# Patient Record
Sex: Female | Born: 1970 | Race: White | Hispanic: No | Smoking: Current some day smoker
Health system: Southern US, Community
[De-identification: ages and names within clinical notes are randomized; demographics above are authoritative.]

## PROBLEM LIST (undated history)

## (undated) DIAGNOSIS — G8929 Other chronic pain: Secondary | ICD-10-CM

## (undated) DIAGNOSIS — F32A Depression, unspecified: Secondary | ICD-10-CM

## (undated) DIAGNOSIS — E049 Nontoxic goiter, unspecified: Secondary | ICD-10-CM

## (undated) DIAGNOSIS — R413 Other amnesia: Secondary | ICD-10-CM

## (undated) DIAGNOSIS — F988 Other specified behavioral and emotional disorders with onset usually occurring in childhood and adolescence: Secondary | ICD-10-CM

## (undated) DIAGNOSIS — F329 Major depressive disorder, single episode, unspecified: Secondary | ICD-10-CM

## (undated) DIAGNOSIS — E039 Hypothyroidism, unspecified: Secondary | ICD-10-CM

## (undated) DIAGNOSIS — M1990 Unspecified osteoarthritis, unspecified site: Secondary | ICD-10-CM

## (undated) DIAGNOSIS — I10 Essential (primary) hypertension: Secondary | ICD-10-CM

## (undated) DIAGNOSIS — F419 Anxiety disorder, unspecified: Secondary | ICD-10-CM

## (undated) DIAGNOSIS — M797 Fibromyalgia: Secondary | ICD-10-CM

## (undated) HISTORY — PX: APPENDECTOMY: SHX54

## (undated) HISTORY — PX: ABDOMINAL HYSTERECTOMY: SHX81

## (undated) HISTORY — DX: Nontoxic goiter, unspecified: E04.9

## (undated) HISTORY — DX: Unspecified osteoarthritis, unspecified site: M19.90

## (undated) HISTORY — PX: ABDOMINAL SURGERY: SHX537

## (undated) HISTORY — DX: Major depressive disorder, single episode, unspecified: F32.9

## (undated) HISTORY — DX: Depression, unspecified: F32.A

## (undated) HISTORY — DX: Anxiety disorder, unspecified: F41.9

---

## 1996-04-10 HISTORY — PX: TONSILLECTOMY: SUR1361

## 1997-09-30 ENCOUNTER — Other Ambulatory Visit: Admission: RE | Admit: 1997-09-30 | Discharge: 1997-09-30 | Payer: Self-pay | Admitting: *Deleted

## 1997-10-07 ENCOUNTER — Other Ambulatory Visit: Admission: RE | Admit: 1997-10-07 | Discharge: 1997-10-07 | Payer: Self-pay | Admitting: Obstetrics and Gynecology

## 1997-10-22 ENCOUNTER — Other Ambulatory Visit: Admission: RE | Admit: 1997-10-22 | Discharge: 1997-10-22 | Payer: Self-pay | Admitting: Obstetrics and Gynecology

## 1997-12-11 ENCOUNTER — Observation Stay (HOSPITAL_COMMUNITY): Admission: RE | Admit: 1997-12-11 | Discharge: 1997-12-11 | Payer: Self-pay | Admitting: Obstetrics and Gynecology

## 1998-07-14 ENCOUNTER — Emergency Department (HOSPITAL_COMMUNITY): Admission: EM | Admit: 1998-07-14 | Discharge: 1998-07-14 | Payer: Self-pay | Admitting: Emergency Medicine

## 1999-11-08 ENCOUNTER — Emergency Department (HOSPITAL_COMMUNITY): Admission: EM | Admit: 1999-11-08 | Discharge: 1999-11-08 | Payer: Self-pay | Admitting: Emergency Medicine

## 2000-09-13 ENCOUNTER — Emergency Department (HOSPITAL_COMMUNITY): Admission: EM | Admit: 2000-09-13 | Discharge: 2000-09-13 | Payer: Self-pay | Admitting: Emergency Medicine

## 2001-01-16 ENCOUNTER — Ambulatory Visit (HOSPITAL_COMMUNITY): Admission: RE | Admit: 2001-01-16 | Discharge: 2001-01-16 | Payer: Self-pay | Admitting: Pulmonary Disease

## 2003-11-29 ENCOUNTER — Emergency Department (HOSPITAL_COMMUNITY): Admission: EM | Admit: 2003-11-29 | Discharge: 2003-11-29 | Payer: Self-pay | Admitting: Emergency Medicine

## 2005-08-08 ENCOUNTER — Ambulatory Visit (HOSPITAL_COMMUNITY): Admission: RE | Admit: 2005-08-08 | Discharge: 2005-08-08 | Payer: Self-pay | Admitting: Family Medicine

## 2005-09-19 ENCOUNTER — Ambulatory Visit (HOSPITAL_COMMUNITY): Admission: RE | Admit: 2005-09-19 | Discharge: 2005-09-19 | Payer: Self-pay | Admitting: Family Medicine

## 2005-10-12 ENCOUNTER — Encounter (HOSPITAL_COMMUNITY): Admission: RE | Admit: 2005-10-12 | Discharge: 2005-11-11 | Payer: Self-pay | Admitting: Unknown Physician Specialty

## 2006-09-21 ENCOUNTER — Emergency Department (HOSPITAL_COMMUNITY): Admission: EM | Admit: 2006-09-21 | Discharge: 2006-09-21 | Payer: Self-pay | Admitting: Emergency Medicine

## 2007-02-06 ENCOUNTER — Other Ambulatory Visit: Admission: RE | Admit: 2007-02-06 | Discharge: 2007-02-06 | Payer: Self-pay | Admitting: Obstetrics & Gynecology

## 2007-03-12 ENCOUNTER — Inpatient Hospital Stay (HOSPITAL_COMMUNITY): Admission: RE | Admit: 2007-03-12 | Discharge: 2007-03-14 | Payer: Self-pay | Admitting: Obstetrics & Gynecology

## 2007-03-12 ENCOUNTER — Encounter: Payer: Self-pay | Admitting: Obstetrics & Gynecology

## 2007-04-11 HISTORY — PX: PARTIAL HYSTERECTOMY: SHX80

## 2008-09-23 ENCOUNTER — Ambulatory Visit (HOSPITAL_COMMUNITY): Admission: RE | Admit: 2008-09-23 | Discharge: 2008-09-23 | Payer: Self-pay | Admitting: Family Medicine

## 2010-08-23 NOTE — Op Note (Signed)
NAMEKETINA, MARS             ACCOUNT NO.:  [id number]   MEDICAL RECORD NO.:  [id number]          PATIENT TYPE:  INP   LOCATION:  9309                          FACILITY:  [facility]   PHYSICIAN:  M. Leda Quail, MD  DATE OF BIRTH:  1971/02/09   DATE OF PROCEDURE:  03/12/2007  DATE OF DISCHARGE:                               OPERATIVE REPORT   PREOPERATIVE DIAGNOSES:  33. A 40 year old, gravida 2 para 2, married white female with chronic      pelvic pain.  2. Fibroids.  3. History of endometriosis.  4. Second history.  5. History of cesarean section x2.   POSTOPERATIVE DIAGNOSES:  48. A 40 year old, gravida 2 para 2, divorced white female with chronic      pelvic pain.  2. Fibroids.  3. History of endometriosis.  4. Second history.  5. History of cesarean section x2.   PROCEDURE:  TAH.   SURGEON:  M. Leda Quail, M.D.   ASSISTANT:  Edwena Felty. Romine, M.D.   ANESTHESIA:  General endotracheal.   FINDINGS:  At least two fundal fibroids were noted.  Survey of the  pelvis revealed a small amount of adhesions to the left ovary.  The site  of prior appendectomy was noted.  There was essentially no endometriosis  noted.  The anterior/posterior cul-de-sacs were surveyed clearly as well  as were the ovaries.  There was some extensive bladder adhesions  anteriorly on the bladder from the bladder to the cervix from her  previous two C-sections.   SPECIMENS:  Uterus and cervix sent to pathology.   ESTIMATED BLOOD LOSS:  250 mL.   FLUIDS:  3700 mL of LR.   URINE OUTPUT:  40 mL of clear urine in the Foley catheter.   COMPLICATIONS:  None.   INDICATIONS:  Ms. Ronique Simerly is a 40 year old, G2 P2, divorced  white female who has a history of chronic pelvic pain.  She underwent  laparoscopy in 1999 and endometriosis was diagnosed.  It was treated and  her pain did improve.  She has had two children delivered with C-  sections.  Since her laparoscopy, slowly the pain has  recurred.  This is  very similar to previous pain in the past.  Because of her diffuse  pelvic pain, physical exam was actually quite difficult when I initially  saw her.  Pelvic ultrasound showed some thin filmy adhesions in the  posterior cul-de-sac with some fluid present.  These were possibly due  to endometriosis.  Otherwise, she had no significant findings except for  some fibroids as noted on the above findings.  The patient and I  discussed a possible cause of the pelvic pain and treatment options.  She has had her tubes tied and she is completely done with childbearing.  Because she has history of endometriosis and the pain is very similar  but more intense, she requested definitive therapy.  We decided to  proceed with abdominal hysterectomy because of her previous two C-  sections.  Informed consent was obtained in the clinic and this was  present on the patient's clinic chart.  DESCRIPTION OF PROCEDURE:  The patient was taken to the operating room.  She was placed in the supine position.  General endotracheal anesthesia  was administered the anesthesia staff without difficulty.  Legs were  positioned in the frog-leg position.  Exam under anesthesia was  performed.  A fairly mobile uterus was noted.  Fundal fibroids are much  more definitively noted on the exam under anesthesia.  The abdomen,  perineum, inner thighs, and the vagina were prepped with a Betadine  prep.  A Foley catheter was inserted in the bladder under sterile  conditions.  The legs were straightened and bent over a pillow.  The  abdomen was then draped in a normal sterile fashion.   With sterile gown and gloves, a Pfannenstiel skin incision was made with  a knife.  This was carried down through subcutaneous fat tissue.  The  fascia was identified and nicked in the midline.  The fascial incision  was extended laterally using curved Mayo scissors.  Kocher clamps were  applied.  The superior aspect of the  fascial incision and the underlying  rectus muscles were dissected off sharply.  In a similar fashion, Kocher  clamps were put in the inferior aspect of fascial incision and the  underlying rectus muscles were dissected off sharply.  Cautery was used  for hemostasis.  Then superiorly between the rectus muscles, the muscles  were divided using a hemostat.  The hemostat was then used to divide the  preperitoneal fat and the peritoneum was popped into very easily.  The  index finger was used to survey the peritoneal tissue below the  incision.  There was a small amount of omental adhesions but no bowel  was noted.  The incision was extended superiorly and inferiorly with  care taken to note first the location of the bowel and the bladder  inferiorly.   The upper abdomen was surveyed.  A sharp liver edge was noted.  A  decompressed gallbladder was noted.  The aorta felt of normal caliber.  There was no nodularity noted.  Because the patient is relatively thin,  an Teacher, early years/pre was used.  A larger retractor was placed in the  incision.  The retractor was then rolled down tightly to help enlarge  the incision.  The operator's hand was then used to sweep around the  retractor on the inside to ensure no omentum or bowel was caught in the  retractor, and this was done without any significant findings.  The  patient's knees were bent slightly, and she was placed in a small amount  of Trendelenburg.  Three moist laparotomy sponges were used to pack the  bowel superiorly.  The pelvis was surveyed at this point.  The ovaries  and fallopian tubes were clearly surveyed.  The above findings were  noted.  Long Kelly clamps were placed across the cornu of the uterus.  The attention was turned initially to the right round ligament.  The  right round ligament suture ligated with 0-Vicryl.  It was then incised  sharply.  The posterior leaf of the broad ligament opened and then the  inferior leaf of broad  ligament was opened down to the level of the  internal os.  There were some dense adhesions of the bladder located at  the level of the internal os.  These were taken down sharply.  The utero-  ovarian pedicle was then isolated.  A ureter was identified.  Curved  Heaney clamps were used to  clamp this pedicle twice.  The pedicle was  transected and then suture ligated with a free tie of 0-Vicryl and a  stitch tie of 0-Vicryl.  Attention was then turned to the left round  ligament.  This was suture ligated with 0-Vicryl.  This was incised  sharply.  The posterior leaf and the anterior leaf of the broad  ligaments were opened using Metzenbaum scissors.  There were dense  bladder adhesions as well located on the patient's left side.  These  were taken down sharply.  The glistening white tissue of the cervix was  then noted.  The dissection of the pubovesical cervical fascia was  performed at this point and a clear plane between the bladder and cervix  was easily noted.  Once all of the adhesions were taken down, then a  sponge stick was used to push the bladder down the cervix further.  Attention was then turned to the left utero-ovarian pedicle.  This was  isolated, transected, and suture ligated, first with a free tie of 0-  Vicryl and then a stitch tie of 0-Vicryl.   The uterine arteries were skeletonized bilaterally.  They were clamp  with curved Heaney clamps, transected, and suture ligated with two  stitches of 0-Vicryl.  Then the cardinal ligaments bilaterally were  serially clamped, transected, and suture ligated with 0-Vicryl.  Further  dissection of the bladder flap was performed as necessary.  At the base  of the cervix, the uterosacral ligaments were clamped, transected, and  Heaney stitched with 0-Vicryl.  Then curved Heaney clamps were placed  across the base of the cervix.  These pedicles were transected and the  vagina was entered.  A stitch of 0-Vicryl with Heaney stitches  were  placed at the corners of the of the vagina.  Then using Jorgenson  scissors, the cervix was circumferentially incised, leaving as much  vaginal tissue as possible.  The cervix and uterus were then handed off  to be sent to pathology.  The corners of the vagina were then stitched  and attached to the ipsilateral uterosacral ligament.  The vagina was  then closed with three additional stitches of 0-Vicryl.  There was a  small amount of bleeding noted at one of the stitches on the vaginal  cuff, and an additional figure-of-eight suture of 0-Vicryl was placed to  achieve hemostasis.  At this point, copious amounts of warm normal  saline were placed in the pelvis.  There was some bleeding on the  bladder flap that was noted.  This was at the place where the previous  dense adhesions were noted.  Because the bleeding was heavy enough that  I did not feel comfortable using cautery, three individual figure-of-  eight sutures using 3-0 Vicryl with an SH needle were placed.  Excellent  hemostasis was achieved after these stitches were placed.  The pelvis  was then re-irrigated.  Bilaterally ureter peristalsis was noted.  A  Gelfoam was placed between the bladder and the top of vagina.  The  ovaries were then attached to the ipsilateral round ligament to help  keep them out of the pelvis.  The three moist laparotomy sponges were  removed and the Alexis retractor was removed.   The peritoneum was closed with a running stitch of 0-Vicryl.  A  subfascial On-Q pump tubing was placed on the patient's right side.  The  tubing was attached to the skin using Steri-Strips.  Then the fascia was  closed using a  running stitch of 0-Vicryl.  This was placed in the left  corner initially run to the midline and then a second stitch was placed  in the left corner and run to the midline.  A second On-Q pump tubing  was placed on the patient's left side subcutaneously.  The subcutaneous  tissue was irrigated.   A small amount of bleeding was noted and  hemostasis was achieved with cautery.  There was one area of bleeding at  the fascia due to one of the stitches.  A superficial stitch of 3-0  Vicryl with a SH needle was placed across the stitched to achieve  hemostasis.  At this point, the skin was closed with a subcuticular  stitch of 3-0 Vicryl.  Then Steri-Strips were applied to the incision.  5 mL of 1% lidocaine were instilled subcutaneously and 10 mL of 1%  lidocaine were instilled subfascially.  The patient tolerated the  procedure well.  At this point, sponge, lap, needle, and instrument  counts were correct x2.  The patient was awakened from anesthesia and  extubated without difficulty.  She was then taken to the recovery room  in stable condition.      Lum Keas, MD  Electronically Signed     MSM/MEDQ  D:  03/12/2007  T:  03/12/2007  Job:  [id number]

## 2010-08-23 NOTE — Discharge Summary (Signed)
[name], [name]             ACCOUNT NO.:  [id number]   MEDICAL RECORD NO.:  [id number]          PATIENT TYPE:  INP   LOCATION:  9309                          FACILITY:  [facility]   PHYSICIAN:  M. Leda Quail, MD  DATE OF BIRTH:  07/28/1970   DATE OF ADMISSION:  03/12/2007  DATE OF DISCHARGE:                               DISCHARGE SUMMARY   PREOPERATIVE DIAGNOSES:  52. A 40 year old, G2, P2 divorced white female with chronic pelvic      pain.  2. Fibroid uterus.  3. History of endometriosis.  4. Cesarean section x2.  5. Smoking history.   POSTOPERATIVE DIAGNOSES:  28. A 40 year old, G2, P2 divorced white female with chronic pelvic      pain.  2. Fibroid uterus.  3. History of endometriosis.  4. Cesarean section x2.  5. Smoking history.   PROCEDURE:  TAH.   HISTORY OF PRESENT ILLNESS:  Written H&P is in the chart, but in brief  [name] is a 40 year old, G2, P2 divorced white female who has a  history of chronic pelvic pain and endometriosis diagnosed by  laparoscopy in 1999.  After her laparoscopy, she did have relief in  pain, but over the last several years she has had worsening pelvic pain  again that is very similar to her previous pain that was diagnosed  before the laparoscopy.  At this time, she is ready for more definitive  therapy, and she has decided to proceed with hysterectomy.  She has had  her tubes tied and is not interested in further childbearing.  A full  H&P is written in the chart.   HOSPITAL COURSE:  The patient was admitted to same-day surgery.  She was  taken to the OR where a TAH was performed.  She did have 250 cc of blood  loss during the procedure.  She may 450 cc of urine and had no issues  during the surgery.  Immediately after the surgery, she was taken to the  recovery room.  She did have some pain issues in the recovery room but  these were controlled with IV pain medications.  She was also started on  IV Toradol.  She was then  transferred to the third floor for the  remainder of her postoperative care.  She did very well immediately  after surgery, and she was seen in the evening of postoperative day  zero.  She was somewhat groggy, but she was easily arousable.  Vital  signs were stable.  She was afebrile.  Urine output was excellent, and  incision had a small amount of bleeding on the dressing, but otherwise  her exam was completely benign.   On the morning of postoperative day #1, she was without complaint.  She  had excellent pain control overnight.  She had no nausea.  T-max was  98.2, pulse was in the 60s-70s, blood pressure was 106-140/68-93.  Ins  and outs showed urine output of over 2600 cc.  Postoperative day one  blood work showed a white count of 18.2, hemoglobin 11.5, platelet count  262.  Her electrolytes were normal with  a BUN and creatinine 1 and 0.6.  Her exam was benign.  She had excellent bowel sounds.  Her abdomen was  soft, nondistended and appropriately tender.  Her dressing was removed,  and before removing the dressing, there was noted to be quite a bit of  blood or serosanguineous fluid that was noted on the right side.  When  the dressing was removed, I noted that her on cue pump tubing on the  right side, which was her subcutaneous one, was poking through the  incision.  This was causing some bleeding at the incision site and was  dripping lidocaine on the dressing.  I decided to go ahead and remove  this portion of the tubing, and we clamped it off to leave the  subfascial one in place.  Steri-Strips were reapplied to her incision  and the bleeding stopped with a tiny bit of pressure.  I did start her  on some oral antibiotics at this point because of the issue of the on  cue pump tubing.  The patient did well during the this day, Foley was  removed.  She was able to eat breakfast.  Her IV and PCA were removed.  She was able to transition to oral pain medication.  She was able to   void without difficulty.  In the evening of postoperative day #1, she  was walking, voiding, tolerating a regular diet on oral pain medicines.  Her exam at this point was completely benign.  Her incision looked good.   She was seen again the morning of postoperative day #2 and was without  complaints.  She did not have any flatus yet, but she had lots of  abdominal rumblings.  Again, she was afebrile with stable vital signs.  A repeated of her CBC showed her white count down to 10.4, her  hemoglobin fairly stable at 10.5.  She had excellent bowel sounds.  Her  abdomen was soft.  Her incision was clean, dry, intact.  At this point,  I felt that she met all criteria for discharge.  Discharge instructions  were provided in written and verbal form.   DISCHARGE MEDICATIONS:  She is to home with:  1. Percocet 5/325, 1-2 tablets p.o. q.4-6h. p.r.n. pain.  2. Motrin 800 mg one every 8 hours a day for pain.  She is also going to be on oral antibiotics because of the problem with  her on cue pump tubing.  This will be:  1. Cipro 500 mg b.i.d. to complete 7 days.  2. Keflex to 500 mg q.i.d. to complete 7 days.   FOLLOWUP:  The patient will see me in 1 week, and she is given discharge  instructions in written and verbal form.   DISPOSITION:  She will be discharged to home with family later this  morning.      Lum Keas, MD  Electronically Signed     MSM/MEDQ  D:  03/14/2007  T:  03/14/2007  Job:  [id number]

## 2010-09-01 ENCOUNTER — Emergency Department (HOSPITAL_COMMUNITY)
Admission: EM | Admit: 2010-09-01 | Discharge: 2010-09-01 | Disposition: A | Discharge status: Home / Self Care | Payer: Worker's Compensation | Attending: Emergency Medicine | Admitting: Emergency Medicine

## 2010-09-01 ENCOUNTER — Emergency Department (HOSPITAL_COMMUNITY): Payer: Worker's Compensation

## 2010-09-01 DIAGNOSIS — S90129A Contusion of unspecified lesser toe(s) without damage to nail, initial encounter: Secondary | ICD-10-CM | POA: Insufficient documentation

## 2010-09-01 DIAGNOSIS — W19XXXA Unspecified fall, initial encounter: Secondary | ICD-10-CM | POA: Insufficient documentation

## 2010-09-01 DIAGNOSIS — M79609 Pain in unspecified limb: Secondary | ICD-10-CM | POA: Insufficient documentation

## 2010-09-01 DIAGNOSIS — M549 Dorsalgia, unspecified: Secondary | ICD-10-CM | POA: Insufficient documentation

## 2010-09-01 DIAGNOSIS — E039 Hypothyroidism, unspecified: Secondary | ICD-10-CM | POA: Insufficient documentation

## 2010-09-01 LAB — RAPID URINE DRUG SCREEN, HOSP PERFORMED
Amphetamines: NOT DETECTED
Benzodiazepines: NOT DETECTED
Cocaine: NOT DETECTED

## 2011-01-16 LAB — CBC
HCT: 30 — ABNORMAL LOW
HCT: 32.9 — ABNORMAL LOW
HCT: 44.9
Hemoglobin: 11.5 — ABNORMAL LOW
Hemoglobin: 15.6 — ABNORMAL HIGH
MCHC: 34.7
MCHC: 34.9
MCV: 98.3
Platelets: 262
RBC: 3.07 — ABNORMAL LOW
RBC: 3.35 — ABNORMAL LOW
RDW: 13.7
WBC: 11.7 — ABNORMAL HIGH

## 2011-01-16 LAB — BASIC METABOLIC PANEL
BUN: 1 — ABNORMAL LOW
CO2: 27
Calcium: 8.5
Creatinine, Ser: 0.63
GFR calc Af Amer: >60
GFR calc non Af Amer: >60
Glucose, Bld: 112 — ABNORMAL HIGH
Sodium: 136

## 2011-01-16 LAB — URINALYSIS, ROUTINE W REFLEX MICROSCOPIC
Bilirubin Urine: NEGATIVE
Glucose, UA: NEGATIVE
Nitrite: NEGATIVE
Protein, ur: NEGATIVE
Specific Gravity, Urine: <1.005 — ABNORMAL LOW
Urobilinogen, UA: 0.2
pH: 6.5

## 2011-01-16 LAB — ABO/RH: ABO/RH(D): O POS

## 2011-01-26 LAB — POCT CARDIAC MARKERS
Myoglobin, poc: 30.9
Troponin i, poc: <0.05

## 2011-01-26 LAB — DIFFERENTIAL
Eosinophils Absolute: 0.2
Lymphocytes Relative: 22
Lymphs Abs: 3.3
Monocytes Absolute: 0.9 — ABNORMAL HIGH
Neutro Abs: 10.9 — ABNORMAL HIGH
Neutrophils Relative %: 71

## 2011-01-26 LAB — BASIC METABOLIC PANEL
CO2: 22
Chloride: 112
Creatinine, Ser: 0.68
GFR calc Af Amer: >60
Glucose, Bld: 124 — ABNORMAL HIGH

## 2011-01-26 LAB — CBC
HCT: 40.5
Platelets: 283

## 2011-09-04 ENCOUNTER — Emergency Department (HOSPITAL_COMMUNITY)
Admission: EM | Admit: 2011-09-04 | Discharge: 2011-09-04 | Disposition: A | Discharge status: Home / Self Care | Payer: Self-pay | Attending: Emergency Medicine | Admitting: Emergency Medicine

## 2011-09-04 ENCOUNTER — Encounter (HOSPITAL_COMMUNITY): Payer: Self-pay | Admitting: Emergency Medicine

## 2011-09-04 DIAGNOSIS — E876 Hypokalemia: Secondary | ICD-10-CM | POA: Insufficient documentation

## 2011-09-04 DIAGNOSIS — IMO0001 Reserved for inherently not codable concepts without codable children: Secondary | ICD-10-CM | POA: Insufficient documentation

## 2011-09-04 DIAGNOSIS — M791 Myalgia, unspecified site: Secondary | ICD-10-CM

## 2011-09-04 DIAGNOSIS — E079 Disorder of thyroid, unspecified: Secondary | ICD-10-CM | POA: Insufficient documentation

## 2011-09-04 DIAGNOSIS — Z79899 Other long term (current) drug therapy: Secondary | ICD-10-CM | POA: Insufficient documentation

## 2011-09-04 HISTORY — DX: Fibromyalgia: M79.7

## 2011-09-04 LAB — CK: Total CK: 44 U/L (ref 7–177)

## 2011-09-04 LAB — COMPREHENSIVE METABOLIC PANEL
ALT: 11 U/L (ref 0–35)
AST: 16 U/L (ref 0–37)
CO2: 25 mEq/L (ref 19–32)
Calcium: 9.9 mg/dL (ref 8.4–10.5)
Chloride: 101 mEq/L (ref 96–112)
GFR calc non Af Amer: >90 mL/min (ref >90)
Potassium: 3.1 mEq/L — ABNORMAL LOW (ref 3.5–5.1)
Sodium: 133 mEq/L — ABNORMAL LOW (ref 135–145)

## 2011-09-04 LAB — CBC
Platelets: 405 10*3/uL — ABNORMAL HIGH (ref 150–400)
RDW: 13.6 % (ref 11.5–15.5)
WBC: 11.6 10*3/uL — ABNORMAL HIGH (ref 4.0–10.5)

## 2011-09-04 LAB — URINALYSIS, ROUTINE W REFLEX MICROSCOPIC
Glucose, UA: NEGATIVE mg/dL
Hgb urine dipstick: NEGATIVE
Protein, ur: NEGATIVE mg/dL

## 2011-09-04 LAB — DIFFERENTIAL
Basophils Absolute: 0 10*3/uL (ref 0.0–0.1)
Lymphocytes Relative: 19 % (ref 12–46)
Neutro Abs: 8.7 10*3/uL — ABNORMAL HIGH (ref 1.7–7.7)
Neutrophils Relative %: 75 % (ref 43–77)

## 2011-09-04 MED ORDER — IBUPROFEN 800 MG PO TABS: 800.0000 mg | ORAL_TABLET | Freq: Three times a day (TID) | ORAL | Status: AC

## 2011-09-04 MED ORDER — POTASSIUM CHLORIDE CRYS ER 20 MEQ PO TBCR
40.0000 meq | EXTENDED_RELEASE_TABLET | Freq: Once | ORAL | Status: AC
  Administered 2011-09-04: 40 meq via ORAL
  Filled 2011-09-04: qty 2

## 2011-09-04 MED ORDER — KETOROLAC TROMETHAMINE 60 MG/2ML IM SOLN
60.0000 mg | Freq: Once | INTRAMUSCULAR | Status: AC
  Administered 2011-09-04: 60 mg via INTRAMUSCULAR
  Filled 2011-09-04: qty 2

## 2011-09-04 MED ORDER — HYDROCODONE-ACETAMINOPHEN 5-325 MG PO TABS: 2.0000 | ORAL_TABLET | ORAL | Status: AC | PRN

## 2011-09-04 MED ORDER — HYDROCODONE-ACETAMINOPHEN 5-325 MG PO TABS
2.0000 | ORAL_TABLET | Freq: Once | ORAL | Status: AC
  Administered 2011-09-04: 2 via ORAL
  Filled 2011-09-04: qty 2

## 2011-09-04 NOTE — ED Provider Notes (Signed)
History  This chart was scribed for Glynn Octave, MD by Sanjuana Letters Ajibulu. This patient was seen in room APA07/APA07 and the patient's care was started at 11:16AM.   CSN: [id number]  Arrival date & time 09/04/11  1002   First MD Initiated Contact with Patient 09/04/11 1114      Chief Complaint  Patient presents with  . Generalized Body Aches    (Consider location/radiation/quality/duration/timing/severity/associated sxs/prior treatment) The history is provided by the patient. No language interpreter was used.   Sharon Atkins is a 41 y.o. female who presents to the Emergency Department complaining of 2 days of Gradual onset, gradually worsening generalized body aches. Pt states that she has a h/o fibromyalgia and states that she felt like she overexerted herself on Saturday. She states that she had a yard sale and mowed 2 acres of her lawn. Pt states that she used to take hydrocodone and was recently prescribed Lyrica for her pain but states that it broke her out into a rash. Pt reports decrease in appetite but denies fever, congestion, eye itching, SOB, leg swelling, vomiting, dysuria, gait problem, rash, syncope, adenopathy and agitation as associated symptoms. Pt denies taking any medication to relieve her current symptoms. She also denies any modifying factors. Pt also has a h/o thyroid disease. Pt is a current everyday smoker but denies a h/o alcohol use.  Past Medical History  Diagnosis Date  . Fibromyalgia   . Thyroid disease     Hypothyroidism    Past Surgical History  Procedure Date  . Cesarean section   . Partial hysterectomy   . Abdominal hysterectomy   . Appendectomy   . Abdominal surgery   . Tonsillectomy     No family history on file.  History  Substance Use Topics  . Smoking status: Current Everyday Smoker -- 0.7 packs/day  . Smokeless tobacco: Not on file  . Alcohol Use: No    OB History    Grav Para Term Preterm Abortions TAB SAB Ect Mult Living                    Review of Systems  Constitutional: Positive for appetite change. Negative for fever.  Genitourinary: Negative for dysuria.  All other systems reviewed and are negative.    Allergies  Lyrica  Home Medications   Current Outpatient Rx  Name Route Sig Dispense Refill  . ALPRAZOLAM 0.5 MG PO TABS Oral Take 0.5 mg by mouth daily as needed. Anxiety    . CITALOPRAM HYDROBROMIDE 40 MG PO TABS Oral Take 40 mg by mouth at bedtime.    Marland Kitchen HYDROCODONE-ACETAMINOPHEN 7.5-325 MG PO TABS Oral Take 1 tablet by mouth every 6 (six) hours as needed. Pain    . IBUPROFEN 200 MG PO TABS Oral Take 800 mg by mouth every 6 (six) hours as needed. Pain    . LEVOTHYROXINE SODIUM 75 MCG PO TABS Oral Take 75 mcg by mouth daily.    Marland Kitchen NORTRIPTYLINE HCL 25 MG PO CAPS Oral Take 25 mg by mouth at bedtime.    Marland Kitchen OVER THE COUNTER MEDICATION Oral Take 1 tablet by mouth at bedtime as needed. Melatonin ER 20 mg. For Sleep    . POTASSIUM CHLORIDE CRYS ER 20 MEQ PO TBCR Oral Take 20 mEq by mouth daily.      Triage Vitals BP 119/73  Pulse 110  Temp(Src) 98.1 F (36.7 C) (Oral)  Resp 18  Ht 5' (1.524 m)  Wt 125 lb (56.7 kg)  BMI 24.41 kg/m2  SpO2 100%  Physical Exam  Nursing note and vitals reviewed. Constitutional: She is oriented to person, place, and time. She appears well-developed and well-nourished.  HENT:  Head: Normocephalic and atraumatic.  Eyes: Conjunctivae and EOM are normal.  Neck: Normal range of motion. Neck supple.  Cardiovascular: Normal rate, regular rhythm and normal heart sounds.        Pt has +2 Dp Tp pulses   Pulmonary/Chest: Effort normal and breath sounds normal.  Abdominal: Soft. Bowel sounds are normal.  Musculoskeletal: Normal range of motion.       Pt has left Para spinal lumbar pain. She has 5 out of 5 strength in the lower extremities. Pt's ankle planter flexion and dorsal flexion are intact. Pt's great toe extensions are intact bilaterally. Pt has +2 patellar  reflexes.  Neurological: She is alert and oriented to person, place, and time.  Skin: Skin is warm and dry.  Psychiatric: She has a normal mood and affect. Her behavior is normal. Judgment and thought content normal.    ED Course  Procedures (including critical care time)  DIAGNOSTIC STUDIES: Oxygen Saturation is 100% on room air, normal by my interpretation.    COORDINATION OF CARE:  11:19AM: Discussed running test to check for abnormalities with pt and pt agreed.  Labs Reviewed  CBC - Abnormal; Notable for the following:    WBC 11.6 (*)    Platelets 405 (*)    All other components within normal limits  DIFFERENTIAL - Abnormal; Notable for the following:    Neutro Abs 8.7 (*)    All other components within normal limits  COMPREHENSIVE METABOLIC PANEL - Abnormal; Notable for the following:    Sodium 133 (*)    Potassium 3.1 (*)    Glucose, Bld 126 (*)    BUN 5 (*)    Total Bilirubin 0.2 (*)    All other components within normal limits  URINALYSIS, ROUTINE W REFLEX MICROSCOPIC - Abnormal; Notable for the following:    Specific Gravity, Urine <1.005 (*)    All other components within normal limits  MAGNESIUM  CK   No results found.   1. Myalgia   2. Hypokalemia       MDM  History of fibromyalgia present with diffuse body pain for the past 3 days. Lyrica was working at a stop due to rash. She denies any fever, chest pain, shortness of breath, nausea or vomiting.  CK normal. Potassium slightly low at 3.1. Patient already on potassium supplements. Is given an extra dose of 40 mEq here.  No evidence of infection in the joints. Stable for outpatient followup.   I personally performed the services described in this documentation, which was scribed in my presence.  The recorded information has been reviewed and considered.    Glynn Octave, MD 09/04/11 [phone]

## 2011-09-04 NOTE — ED Notes (Signed)
Patient with c/o generalized body pain. Reports history of fibromyalgia and flare up on 5/25. Recently put on Lyrica for pain, but states she broke out into a rash so she had to stop taking it.

## 2011-09-04 NOTE — Discharge Instructions (Signed)

## 2011-09-04 NOTE — ED Notes (Signed)
Pt ambulatory at discharge; instructed not to drive while on pain medication

## 2011-10-10 ENCOUNTER — Other Ambulatory Visit: Payer: Self-pay | Admitting: Family Medicine

## 2011-10-10 DIAGNOSIS — E049 Nontoxic goiter, unspecified: Secondary | ICD-10-CM

## 2011-10-16 ENCOUNTER — Ambulatory Visit (HOSPITAL_COMMUNITY)
Admission: RE | Admit: 2011-10-16 | Discharge: 2011-10-16 | Disposition: A | Discharge status: Home / Self Care | Payer: Medicaid Other | Source: Ambulatory Visit | Attending: Family Medicine | Admitting: Family Medicine

## 2011-10-16 DIAGNOSIS — E049 Nontoxic goiter, unspecified: Secondary | ICD-10-CM

## 2011-10-16 DIAGNOSIS — E042 Nontoxic multinodular goiter: Secondary | ICD-10-CM | POA: Insufficient documentation

## 2011-11-20 ENCOUNTER — Emergency Department (HOSPITAL_COMMUNITY): Payer: Medicaid Other

## 2011-11-20 ENCOUNTER — Encounter (HOSPITAL_COMMUNITY): Payer: Self-pay | Admitting: *Deleted

## 2011-11-20 ENCOUNTER — Emergency Department (HOSPITAL_COMMUNITY)
Admission: EM | Admit: 2011-11-20 | Discharge: 2011-11-20 | Disposition: A | Discharge status: Home / Self Care | Payer: Medicaid Other | Attending: Emergency Medicine | Admitting: Emergency Medicine

## 2011-11-20 DIAGNOSIS — E871 Hypo-osmolality and hyponatremia: Secondary | ICD-10-CM | POA: Insufficient documentation

## 2011-11-20 DIAGNOSIS — E079 Disorder of thyroid, unspecified: Secondary | ICD-10-CM | POA: Insufficient documentation

## 2011-11-20 DIAGNOSIS — IMO0001 Reserved for inherently not codable concepts without codable children: Secondary | ICD-10-CM | POA: Insufficient documentation

## 2011-11-20 DIAGNOSIS — R42 Dizziness and giddiness: Secondary | ICD-10-CM | POA: Insufficient documentation

## 2011-11-20 DIAGNOSIS — Z79899 Other long term (current) drug therapy: Secondary | ICD-10-CM | POA: Insufficient documentation

## 2011-11-20 DIAGNOSIS — Z9089 Acquired absence of other organs: Secondary | ICD-10-CM | POA: Insufficient documentation

## 2011-11-20 DIAGNOSIS — F17200 Nicotine dependence, unspecified, uncomplicated: Secondary | ICD-10-CM | POA: Insufficient documentation

## 2011-11-20 LAB — URINALYSIS, ROUTINE W REFLEX MICROSCOPIC
Ketones, ur: NEGATIVE mg/dL
Leukocytes, UA: NEGATIVE
Protein, ur: NEGATIVE mg/dL
Urobilinogen, UA: 0.2 mg/dL (ref 0.0–1.0)

## 2011-11-20 LAB — CBC WITH DIFFERENTIAL/PLATELET
HCT: 37.4 % (ref 36.0–46.0)
Hemoglobin: 13.5 g/dL (ref 12.0–15.0)
Lymphocytes Relative: 19 % (ref 12–46)
Lymphs Abs: 1.6 10*3/uL (ref 0.7–4.0)
MCHC: 36.1 g/dL — ABNORMAL HIGH (ref 30.0–36.0)
Monocytes Absolute: 0.8 10*3/uL (ref 0.1–1.0)
Monocytes Relative: 9 % (ref 3–12)
Neutro Abs: 6.3 10*3/uL (ref 1.7–7.7)
WBC: 8.9 10*3/uL (ref 4.0–10.5)

## 2011-11-20 LAB — COMPREHENSIVE METABOLIC PANEL
BUN: 6 mg/dL (ref 6–23)
CO2: 22 mEq/L (ref 19–32)
Chloride: 96 mEq/L (ref 96–112)
Creatinine, Ser: 1.09 mg/dL (ref 0.50–1.10)
GFR calc non Af Amer: 62 mL/min — ABNORMAL LOW (ref >90)
Glucose, Bld: 73 mg/dL (ref 70–99)
Total Bilirubin: 0.3 mg/dL (ref 0.3–1.2)

## 2011-11-20 MED ORDER — SODIUM CHLORIDE 0.9 % IV SOLN
Freq: Once | INTRAVENOUS | Status: AC
  Administered 2011-11-20: 16:00:00 via INTRAVENOUS

## 2011-11-20 MED ORDER — SODIUM CHLORIDE 0.9 % IV SOLN: Freq: Once | INTRAVENOUS | Status: DC

## 2011-11-20 MED ORDER — SODIUM CHLORIDE 0.9 % IV SOLN
Freq: Once | INTRAVENOUS | Status: AC
  Administered 2011-11-20: 14:00:00 via INTRAVENOUS

## 2011-11-20 MED ORDER — ONDANSETRON HCL 4 MG/2ML IJ SOLN
4.0000 mg | Freq: Once | INTRAMUSCULAR | Status: AC
  Administered 2011-11-20: 4 mg via INTRAVENOUS
  Filled 2011-11-20: qty 2

## 2011-11-20 MED ORDER — KETOROLAC TROMETHAMINE 30 MG/ML IJ SOLN
30.0000 mg | Freq: Once | INTRAMUSCULAR | Status: AC
  Administered 2011-11-20: 30 mg via INTRAVENOUS
  Filled 2011-11-20: qty 1

## 2011-11-20 NOTE — ED Provider Notes (Signed)
History   This chart was scribed for Gerhard Munch, MD by Charolett Bumpers . The patient was seen in room APA06/APA06. Patient's care was started at 1307.    CSN: [id number]  Arrival date & time 11/20/11  1223   First MD Initiated Contact with Patient 11/20/11 1307      Chief Complaint  Patient presents with  . Dizziness    (Consider location/radiation/quality/duration/timing/severity/associated sxs/prior treatment) HPI Sharon Atkins is a 41 y.o. female who presents to the Emergency Department complaining of constant, moderate dizziness for the past week. Pt reports that she has felt off-balance for the past week or so. Pt reports falling over the past week, hitting her head leaving a contusion to her left eyebrow. Pt reports associated confusion, headache, nausea, lower back pain, and vomiting X1. Pt reports a h/o fibromyalgia that is not controlled, but reports that she was otherwise healthy prior to a week and a half ago. Pt reports that she fell in a chair with the onset of the symptoms. Pt denies any recent accidents or medication changes. Pt denies any focal weakness, fevers, chills, chest pain or difficulty breathing. Pt states that she was sent over via PCP for futher evaluation.   PCP: Dr. Gerda Diss  Past Medical History  Diagnosis Date  . Fibromyalgia   . Thyroid disease     Hypothyroidism    Past Surgical History  Procedure Date  . Cesarean section   . Partial hysterectomy   . Abdominal hysterectomy   . Appendectomy   . Abdominal surgery   . Tonsillectomy     History reviewed. No pertinent family history.  History  Substance Use Topics  . Smoking status: Current Everyday Smoker -- 0.7 packs/day  . Smokeless tobacco: Not on file  . Alcohol Use: No    OB History    Grav Para Term Preterm Abortions TAB SAB Ect Mult Living                  Review of Systems  Constitutional:       HPI  HENT:       HPI otherwise negative  Eyes: Negative.     Respiratory:       HPI, otherwise negative  Cardiovascular:       HPI, otherwise nmegative  Gastrointestinal: Negative for vomiting.  Genitourinary:       HPI, otherwise negative  Musculoskeletal:       HPI, otherwise negative  Skin: Negative.   Neurological: Negative for syncope.  All other systems reviewed and are negative.    Allergies  Lyrica  Home Medications   Current Outpatient Rx  Name Route Sig Dispense Refill  . ALPRAZOLAM 0.5 MG PO TABS Oral Take 0.5 mg by mouth daily as needed. Anxiety    . CITALOPRAM HYDROBROMIDE 40 MG PO TABS Oral Take 40 mg by mouth at bedtime.    Marland Kitchen HYDROCODONE-ACETAMINOPHEN 7.5-325 MG PO TABS Oral Take 1 tablet by mouth every 6 (six) hours as needed. Pain    . IBUPROFEN 200 MG PO TABS Oral Take 800 mg by mouth every 6 (six) hours as needed. Pain    . LEVOTHYROXINE SODIUM 75 MCG PO TABS Oral Take 75 mcg by mouth daily.    Marland Kitchen NORTRIPTYLINE HCL 25 MG PO CAPS Oral Take 25 mg by mouth at bedtime.    Marland Kitchen OVER THE COUNTER MEDICATION Oral Take 1 tablet by mouth at bedtime as needed. Melatonin ER 20 mg. For Sleep    .  POTASSIUM CHLORIDE CRYS ER 20 MEQ PO TBCR Oral Take 20 mEq by mouth daily.      BP 126/86  Pulse 110  Temp 98.4 F (36.9 C) (Oral)  Resp 20  Ht 5' (1.524 m)  Wt 130 lb (58.968 kg)  BMI 25.39 kg/m2  SpO2 99%  Physical Exam  Nursing note and vitals reviewed. Constitutional: She is oriented to person, place, and time. She appears well-developed and well-nourished. No distress.  HENT:  Head: Normocephalic and atraumatic.  Eyes: Conjunctivae and EOM are normal.  Neck: Normal range of motion.       Good ROM of neck. No meningeal signs.   Cardiovascular: Normal rate, regular rhythm and normal heart sounds.   Pulmonary/Chest: Effort normal and breath sounds normal. No stridor. No respiratory distress. She has no wheezes.  Abdominal: Soft. Bowel sounds are normal. She exhibits no distension. There is no tenderness.  Musculoskeletal: She  exhibits no edema.       No edema of lower extremities.   Neurological: She is alert and oriented to person, place, and time. No cranial nerve deficit. Coordination normal.       Grip strength equal bilaterally. Cranial nerves intact. No abnormal coordination.    Skin: Skin is warm and dry.       Minimal raised hematoma with a 3 mm abrasion above left eyebrow.   Psychiatric: She has a normal mood and affect.    ED Course  Procedures (including critical care time)  DIAGNOSTIC STUDIES: Oxygen Saturation is 99% on room air, normal by my interpretation.    COORDINATION OF CARE:  13:41-Discussed planned course of treatment with the patient, who is agreeable at this time.   14:00-Medication Orders: Ketorolac (Toradol) 30 mg/mL injection 30 mg-once; Ondansetron (Zofran) injection 4 mg-once.   Labs Reviewed - No data to display No results found.   No diagnosis found.    MDM  I personally performed the services described in this documentation, which was scribed in my presence. The recorded information has been reviewed and considered.  This young female presents with disequilibrium, and a fall secondary to this with head hematoma.  On my exam the patient is awake, alert, oriented, no focal neurologic deficiencies.  The patient's labs notable mostly for hyponatremia, which is likely contributing to her condition.  Radiographic studies were reassuring.  Following IV fluid provision the patient was discharged in stable condition with explicit instructions for a repeat blood drawn in 2 days and PMD followup. Absence neuro findings, with her history of fibromyalgia, with the denial of any confusion, chest pain, dyspnea there is low suspicion of acute ongoing CVA, cardiac or pulmonary etiologies.  Gerhard Munch, MD 11/20/11 2144

## 2011-11-20 NOTE — ED Notes (Signed)
Pt c/o feeling off balance, dizzy and nauseated for 1 1/2 weeks. Pt states that she has fallen several times and that she hit her head. Purple bruising noted to forehead. Pt denies loss of consciousness.

## 2011-11-20 NOTE — ED Notes (Signed)
Pt says she had felt "off balance" for 1.5 weeks. Had fallen app 5 times, Has contusion to lt eyebrow.  Does not know if unconscious or not. Alert, Sl headache and nausea, vomited x1

## 2011-11-20 NOTE — ED Notes (Signed)
Pt also noted to have large area of bruising on her left mid back. States that she did not know she had a bruise there.

## 2011-12-28 ENCOUNTER — Encounter: Payer: Self-pay | Admitting: Endocrinology

## 2011-12-28 ENCOUNTER — Ambulatory Visit (INDEPENDENT_AMBULATORY_CARE_PROVIDER_SITE_OTHER): Payer: Medicaid Other | Admitting: Endocrinology

## 2011-12-28 VITALS — BP 130/92 | HR 95 | Temp 98.3°F | Ht 60.0 in | Wt 145.0 lb

## 2011-12-28 DIAGNOSIS — E039 Hypothyroidism, unspecified: Secondary | ICD-10-CM

## 2011-12-28 NOTE — Progress Notes (Signed)
Subjective:    Patient ID: Sharon Atkins, female    DOB: 1970-09-13, 41 y.o.   MRN: [id number]  HPI Pt reports 4 years of hypothyroidism.  She has been on synthroid x 18 months.  She reports moderate pain throughout the lower extremities, and assoc depression.  She says her depression is due to pain.  2 mos ago, synthroid was increased to 88 mcg/day, and repeat TSH was normal.  However, she says sxs have not improved with normalization of her TSH.   Past Medical History  Diagnosis Date  . Fibromyalgia   . Thyroid disease     Hypothyroidism  . Depression   . Hypertension   . Goiter     Past Surgical History  Procedure Date  . Cesarean section 1995, 2001  . Partial hysterectomy 2009  . Abdominal hysterectomy   . Appendectomy   . Abdominal surgery   . Tonsillectomy 1998    History   Social History  . Marital Status: Divorced    Spouse Name: N/A    Number of Children: 2  . Years of Education: 16   Occupational History  . Unemployed    Social History Main Topics  . Smoking status: Current Every Day Smoker -- 1.0 packs/day for 10 years  . Smokeless tobacco: Never Used  . Alcohol Use: No  . Drug Use: No  . Sexually Active:    Other Topics Concern  . Not on file   Social History Narrative   Regular exercise-yesCaffeine Use-yes    Current Outpatient Prescriptions on File Prior to Visit  Medication Sig Dispense Refill  . ALPRAZolam (XANAX) 0.5 MG tablet Take 0.5 mg by mouth daily as needed. Anxiety      . amLODipine (NORVASC) 5 MG tablet Take 5 mg by mouth daily.      . citalopram (CELEXA) 40 MG tablet Take 40 mg by mouth at bedtime.      Marland Kitchen levothyroxine (SYNTHROID, LEVOTHROID) 88 MCG tablet Take 88 mcg by mouth daily.      . traZODone (DESYREL) 50 MG tablet Take 50 mg by mouth at bedtime as needed. Sleep        Allergies  Allergen Reactions  . Lyrica (Pregabalin) Rash    Family History  Problem Relation Age of Onset  . Hypertension Other     Parent    mother had hypothyroidism  BP 130/92  Pulse 95  Temp 98.3 F (36.8 C) (Oral)  Ht 5' (1.524 m)  Wt 145 lb (65.772 kg)  BMI 28.32 kg/m2  SpO2 95%  Review of Systems She denies cramps, sob, memory loss, constipation, n/v, headache, numbness, blurry vision, rhinorrhea, and syncope.  She reports fatigue, anxiety, insomnia, weight gain, hair loss, easy bruising, dry skin, and difficulty with concentration.     Objective:   Physical Exam VS: see vs page GEN: no distress HEAD: head: no deformity eyes: no periorbital swelling, no proptosis external nose and ears are normal mouth: no lesion seen NECK: supple, thyroid is not enlarged CHEST WALL: no deformity LUNGS:  Clear to auscultation CV: reg rate and rhythm, no murmur ABD: abdomen is soft, nontender.  no hepatosplenomegaly.  not distended.  no hernia MUSCULOSKELETAL: muscle bulk and strength are grossly normal.  no obvious joint swelling.  gait is normal and steady EXTEMITIES: no deformity.  no edema PULSES: dorsalis pedis intact bilat.  NEURO:  cn 2-12 grossly intact.   readily moves all 4's.  sensation is intact to touch on the feet SKIN:  Normal texture and temperature.  No rash or suspicious lesion is visible.   NODES:  None palpable at the neck PSYCH: alert, oriented x3.  Does not appear anxious nor depressed.   outside test results are reviewed: TSH=normal (i also reviewed [place] result)    Assessment & Plan:  Chronic thyroiditis, well-replaced Chronic pain syndrome, not thyroid-related Weight gain, and other sxs, not thyroid-related

## 2011-12-28 NOTE — Patient Instructions (Addendum)
Please continue the same thyroid medication You symptoms are not coming from the thyroid.   Please come back here as needed.

## 2011-12-31 ENCOUNTER — Encounter: Payer: Self-pay | Admitting: Endocrinology

## 2011-12-31 DIAGNOSIS — E039 Hypothyroidism, unspecified: Secondary | ICD-10-CM | POA: Insufficient documentation

## 2011-12-31 DIAGNOSIS — M797 Fibromyalgia: Secondary | ICD-10-CM | POA: Insufficient documentation

## 2011-12-31 DIAGNOSIS — F329 Major depressive disorder, single episode, unspecified: Secondary | ICD-10-CM | POA: Insufficient documentation

## 2011-12-31 DIAGNOSIS — I10 Essential (primary) hypertension: Secondary | ICD-10-CM | POA: Insufficient documentation

## 2012-01-16 ENCOUNTER — Other Ambulatory Visit: Payer: Self-pay | Admitting: Family Medicine

## 2012-01-16 ENCOUNTER — Ambulatory Visit (HOSPITAL_COMMUNITY)
Admission: RE | Admit: 2012-01-16 | Discharge: 2012-01-16 | Disposition: A | Discharge status: Home / Self Care | Payer: Medicaid Other | Source: Ambulatory Visit | Attending: Family Medicine | Admitting: Family Medicine

## 2012-01-16 DIAGNOSIS — R52 Pain, unspecified: Secondary | ICD-10-CM

## 2012-01-16 DIAGNOSIS — M25569 Pain in unspecified knee: Secondary | ICD-10-CM | POA: Insufficient documentation

## 2012-01-30 DIAGNOSIS — F411 Generalized anxiety disorder: Secondary | ICD-10-CM | POA: Insufficient documentation

## 2012-02-13 ENCOUNTER — Ambulatory Visit (INDEPENDENT_AMBULATORY_CARE_PROVIDER_SITE_OTHER): Payer: Medicaid Other | Admitting: Orthopedic Surgery

## 2012-02-13 DIAGNOSIS — M25561 Pain in right knee: Secondary | ICD-10-CM

## 2012-02-13 DIAGNOSIS — M25569 Pain in unspecified knee: Secondary | ICD-10-CM

## 2012-02-13 NOTE — Patient Instructions (Addendum)
Call hospital to arrange PT   Patella Problems (Patellofemoral Syndrome) This syndrome is caused by changes in the undersurface of the kneecap (patella). The changes vary from minor inflammation to major changes such as breakdown of the cartilage on the undersurface of the patella. The major changes can be seen with an arthroscope (a small, pencil-sized telescope). These changes can result from various factors. These factors may arise from abnormal tracking (movement or malalignment) of the patella. Normally the Patella is in its normal groove located between the condyles (grooved end) of the femur (thigh bone). Abnormal movement leads to increased pressure in the patellofemoral joint. This leads to swelling in the cartilage, inflammation and pain. SYMPTOMS   The patient with this syndrome usually has an ache in the knee. It is often aggravated by:  Prolonged sitting.   Squatting.   Climbing stairs.   Running down hill.   Other exercising that stresses the knee.  Other findings may include the knee giving way, swelling, and or locking. TREATMENT   The treatment will depend on the cause of the problem. Sometimes the solution is as simple as cutting down on activities. Giving your joint a rest with the use of crutches and braces can also help. This is generally followed by strengthening exercises. RECOVERY Recovery from a patellar problem depends on the type of problem in your knee and on the treatment required. If conservative treatment works the recovery period may be as little as three to four weeks. HOME CARE INSTRUCTIONS  Following exercise, use an ice pack for twenty to thirty minutes three to four times per day. Use a towel between your ice pack and the skin.   Reduction of inflammation with anti-inflammatories may be helpful. Only take over-the-counter or prescription medicines for pain, discomfort, or fever as directed by your caregiver.   Taping the knee or using a neoprene sleeve  with a patellar cutout to provide better tracking of the patella may give relief.   Muscle (quadriceps) strengthening exercises are helpful. Follow your caregiver's advice.   Muscle stretching prior to exercise may be helpful.   Soft tissue therapy using ultrasound, and diathermy may be helpful.   If conservative therapy is not effective, surgery may provide relief. During arthroscopy, your caregiver may discover a rough surface beneath your kneecap. If this happens, your caregiver may smooth this out by shaving the surface.  SEEK IMMEDIATE MEDICAL CARE IF:    You develop a rash.   You have difficulty breathing.   You have any allergic problems.  MAKE SURE YOU:    Understand these instructions.   Will watch your condition.   Will get help right away if you are not doing well or get worse.  Document Released: 03/24/2000 Document Revised: 06/19/2011 Document Reviewed: 04/13/2008 [facility] Patient Information 2013 [place], [place].

## 2012-02-14 ENCOUNTER — Encounter: Payer: Self-pay | Admitting: Orthopedic Surgery

## 2012-02-14 DIAGNOSIS — M25561 Pain in right knee: Secondary | ICD-10-CM | POA: Insufficient documentation

## 2012-02-14 NOTE — Progress Notes (Signed)
Patient ID: Sharon Atkins, female   DOB: 12/02/1970, 41 y.o.   MRN: [id number] Chief Complaint  Patient presents with  . Knee Pain    rt knee pain x 6-8 weeks   Dr. Lilyan Punt  This patient complains a 6-8 weeks sudden onset of throbbing 8/10 intermittent right knee pain with pain and swelling. Pain is located over the anterior knee and she gives an anterior knee pain the air sign. No catching does complain of locking.  Review of systems weight gain joint pain swelling instability stiffness muscle pain  Nervousness anxiety depression easy bruising and heat and cold intolerance  Medical history as recorded and reviewed  Exam vital signs recorded stable appearance normal oriented x3 mood and affect normal. Normal gait pattern no length  Right knee tenderness medial facet normal range of motion no swelling no instability normal strength normal muscle tone normal skin normal pulse normal temperature. Normal sensation. Negative pathologic reflexes normal balance.  X-ray is on a disc  X-ray normal  Anterior knee pain syndrome  No surgery needed  Recommend physical therapy.

## 2012-02-20 DIAGNOSIS — M5030 Other cervical disc degeneration, unspecified cervical region: Secondary | ICD-10-CM | POA: Insufficient documentation

## 2012-02-20 DIAGNOSIS — E785 Hyperlipidemia, unspecified: Secondary | ICD-10-CM | POA: Insufficient documentation

## 2012-02-21 ENCOUNTER — Ambulatory Visit (HOSPITAL_COMMUNITY): Payer: Self-pay | Admitting: Physical Therapy

## 2012-06-18 ENCOUNTER — Other Ambulatory Visit: Payer: Self-pay | Admitting: Family Medicine

## 2012-06-18 DIAGNOSIS — Z1231 Encounter for screening mammogram for malignant neoplasm of breast: Secondary | ICD-10-CM

## 2012-06-26 DIAGNOSIS — M359 Systemic involvement of connective tissue, unspecified: Secondary | ICD-10-CM | POA: Insufficient documentation

## 2012-07-08 DIAGNOSIS — M7072 Other bursitis of hip, left hip: Secondary | ICD-10-CM | POA: Insufficient documentation

## 2012-07-08 DIAGNOSIS — R768 Other specified abnormal immunological findings in serum: Secondary | ICD-10-CM | POA: Insufficient documentation

## 2012-07-08 DIAGNOSIS — M2550 Pain in unspecified joint: Secondary | ICD-10-CM | POA: Insufficient documentation

## 2012-07-12 ENCOUNTER — Ambulatory Visit: Payer: Self-pay

## 2012-07-29 DIAGNOSIS — M545 Low back pain, unspecified: Secondary | ICD-10-CM | POA: Insufficient documentation

## 2012-08-06 ENCOUNTER — Ambulatory Visit: Payer: Self-pay

## 2012-08-14 ENCOUNTER — Ambulatory Visit
Admission: RE | Admit: 2012-08-14 | Discharge: 2012-08-14 | Disposition: A | Discharge status: Home / Self Care | Payer: Medicaid Other | Source: Ambulatory Visit | Attending: Family Medicine | Admitting: Family Medicine

## 2012-08-14 ENCOUNTER — Ambulatory Visit: Payer: Self-pay

## 2012-08-14 DIAGNOSIS — Z1231 Encounter for screening mammogram for malignant neoplasm of breast: Secondary | ICD-10-CM

## 2012-09-16 ENCOUNTER — Emergency Department (HOSPITAL_COMMUNITY): Payer: Medicaid Other

## 2012-09-16 ENCOUNTER — Inpatient Hospital Stay (HOSPITAL_COMMUNITY)
Admission: EM | Admit: 2012-09-16 | Discharge: 2012-09-19 | DRG: 682 | Disposition: A | Discharge status: Home / Self Care | Payer: Medicaid Other | Attending: Pulmonary Disease | Admitting: Pulmonary Disease

## 2012-09-16 ENCOUNTER — Encounter (HOSPITAL_COMMUNITY): Payer: Self-pay | Admitting: Emergency Medicine

## 2012-09-16 ENCOUNTER — Inpatient Hospital Stay (HOSPITAL_COMMUNITY): Payer: Medicaid Other

## 2012-09-16 DIAGNOSIS — E872 Acidosis, unspecified: Secondary | ICD-10-CM | POA: Diagnosis present

## 2012-09-16 DIAGNOSIS — A419 Sepsis, unspecified organism: Secondary | ICD-10-CM

## 2012-09-16 DIAGNOSIS — R6510 Systemic inflammatory response syndrome (SIRS) of non-infectious origin without acute organ dysfunction: Secondary | ICD-10-CM

## 2012-09-16 DIAGNOSIS — R6511 Systemic inflammatory response syndrome (SIRS) of non-infectious origin with acute organ dysfunction: Secondary | ICD-10-CM | POA: Diagnosis present

## 2012-09-16 DIAGNOSIS — D649 Anemia, unspecified: Secondary | ICD-10-CM | POA: Diagnosis present

## 2012-09-16 DIAGNOSIS — I10 Essential (primary) hypertension: Secondary | ICD-10-CM | POA: Diagnosis present

## 2012-09-16 DIAGNOSIS — I498 Other specified cardiac arrhythmias: Secondary | ICD-10-CM | POA: Diagnosis present

## 2012-09-16 DIAGNOSIS — R001 Bradycardia, unspecified: Secondary | ICD-10-CM

## 2012-09-16 DIAGNOSIS — Z79899 Other long term (current) drug therapy: Secondary | ICD-10-CM

## 2012-09-16 DIAGNOSIS — F17200 Nicotine dependence, unspecified, uncomplicated: Secondary | ICD-10-CM | POA: Diagnosis present

## 2012-09-16 DIAGNOSIS — S7010XA Contusion of unspecified thigh, initial encounter: Secondary | ICD-10-CM | POA: Diagnosis present

## 2012-09-16 DIAGNOSIS — N179 Acute kidney failure, unspecified: Principal | ICD-10-CM

## 2012-09-16 DIAGNOSIS — E876 Hypokalemia: Secondary | ICD-10-CM | POA: Diagnosis present

## 2012-09-16 DIAGNOSIS — Y92009 Unspecified place in unspecified non-institutional (private) residence as the place of occurrence of the external cause: Secondary | ICD-10-CM

## 2012-09-16 DIAGNOSIS — G9340 Encephalopathy, unspecified: Secondary | ICD-10-CM

## 2012-09-16 DIAGNOSIS — E039 Hypothyroidism, unspecified: Secondary | ICD-10-CM | POA: Diagnosis present

## 2012-09-16 DIAGNOSIS — M542 Cervicalgia: Secondary | ICD-10-CM | POA: Diagnosis present

## 2012-09-16 DIAGNOSIS — E860 Dehydration: Secondary | ICD-10-CM | POA: Diagnosis present

## 2012-09-16 DIAGNOSIS — F3289 Other specified depressive episodes: Secondary | ICD-10-CM | POA: Diagnosis present

## 2012-09-16 DIAGNOSIS — E049 Nontoxic goiter, unspecified: Secondary | ICD-10-CM | POA: Diagnosis present

## 2012-09-16 DIAGNOSIS — F329 Major depressive disorder, single episode, unspecified: Secondary | ICD-10-CM

## 2012-09-16 DIAGNOSIS — M6282 Rhabdomyolysis: Secondary | ICD-10-CM | POA: Diagnosis present

## 2012-09-16 DIAGNOSIS — IMO0001 Reserved for inherently not codable concepts without codable children: Secondary | ICD-10-CM | POA: Diagnosis present

## 2012-09-16 DIAGNOSIS — W19XXXA Unspecified fall, initial encounter: Secondary | ICD-10-CM | POA: Diagnosis present

## 2012-09-16 DIAGNOSIS — M797 Fibromyalgia: Secondary | ICD-10-CM

## 2012-09-16 LAB — CBC WITH DIFFERENTIAL/PLATELET
Basophils Absolute: 0 10*3/uL (ref 0.0–0.1)
Basophils Relative: 0 % (ref 0–1)
Eosinophils Relative: 1 % (ref 0–5)
HCT: 33.5 % — ABNORMAL LOW (ref 36.0–46.0)
Lymphocytes Relative: 15 % (ref 12–46)
MCHC: 34.9 g/dL (ref 30.0–36.0)
Monocytes Absolute: 1.4 10*3/uL — ABNORMAL HIGH (ref 0.1–1.0)
Neutro Abs: 12.2 10*3/uL — ABNORMAL HIGH (ref 1.7–7.7)
Platelets: 230 10*3/uL (ref 150–400)
RDW: 14.5 % (ref 11.5–15.5)
WBC: 16.2 10*3/uL — ABNORMAL HIGH (ref 4.0–10.5)

## 2012-09-16 LAB — TSH: TSH: 0.37 u[IU]/mL (ref 0.350–4.500)

## 2012-09-16 LAB — URINALYSIS, ROUTINE W REFLEX MICROSCOPIC
Glucose, UA: NEGATIVE mg/dL
Leukocytes, UA: NEGATIVE
Nitrite: NEGATIVE
Specific Gravity, Urine: 1.022 (ref 1.005–1.030)
pH: 5.5 (ref 5.0–8.0)

## 2012-09-16 LAB — COMPREHENSIVE METABOLIC PANEL
ALT: 32 U/L (ref 0–35)
AST: 74 U/L — ABNORMAL HIGH (ref 0–37)
Albumin: 3 g/dL — ABNORMAL LOW (ref 3.5–5.2)
CO2: 17 mEq/L — ABNORMAL LOW (ref 19–32)
Calcium: 8.2 mg/dL — ABNORMAL LOW (ref 8.4–10.5)
Chloride: 104 mEq/L (ref 96–112)
Creatinine, Ser: 5.09 mg/dL — ABNORMAL HIGH (ref 0.50–1.10)
Sodium: 137 mEq/L (ref 135–145)

## 2012-09-16 LAB — CORTISOL: Cortisol, Plasma: 18.2 ug/dL

## 2012-09-16 LAB — PROTIME-INR: INR: 1.19 (ref 0.00–1.49)

## 2012-09-16 LAB — RAPID URINE DRUG SCREEN, HOSP PERFORMED
Barbiturates: NOT DETECTED
Benzodiazepines: POSITIVE — AB
Cocaine: NOT DETECTED
Opiates: NOT DETECTED

## 2012-09-16 LAB — ETHANOL: Alcohol, Ethyl (B): <11 mg/dL (ref 0–11)

## 2012-09-16 LAB — URINE MICROSCOPIC-ADD ON

## 2012-09-16 LAB — CG4 I-STAT (LACTIC ACID): Lactic Acid, Venous: 1.09 mmol/L (ref 0.5–2.2)

## 2012-09-16 LAB — MRSA PCR SCREENING: MRSA by PCR: NEGATIVE

## 2012-09-16 LAB — LACTIC ACID, PLASMA: Lactic Acid, Venous: 0.8 mmol/L (ref 0.5–2.2)

## 2012-09-16 LAB — GRAM STAIN

## 2012-09-16 LAB — ACETAMINOPHEN LEVEL: Acetaminophen (Tylenol), Serum: <15 ug/mL (ref 10–30)

## 2012-09-16 LAB — GLUCOSE, CSF: Glucose, CSF: 80 mg/dL — ABNORMAL HIGH (ref 43–76)

## 2012-09-16 MED ORDER — SODIUM CHLORIDE 0.9 % IV SOLN
INTRAVENOUS | Status: DC
  Administered 2012-09-16: 1000 mL via INTRAVENOUS

## 2012-09-16 MED ORDER — SODIUM CHLORIDE 0.9 % IV BOLUS (SEPSIS)
500.0000 mL | Freq: Once | INTRAVENOUS | Status: AC
  Administered 2012-09-16: 500 mL via INTRAVENOUS

## 2012-09-16 MED ORDER — SODIUM CHLORIDE 0.9 % IV BOLUS (SEPSIS)
1000.0000 mL | Freq: Once | INTRAVENOUS | Status: AC
  Administered 2012-09-16: 1000 mL via INTRAVENOUS

## 2012-09-16 MED ORDER — SODIUM CHLORIDE 0.9 % IV BOLUS (SEPSIS): 500.0000 mL | Freq: Once | INTRAVENOUS | Status: DC

## 2012-09-16 MED ORDER — POTASSIUM CHLORIDE 10 MEQ/100ML IV SOLN
10.0000 meq | INTRAVENOUS | Status: AC
  Administered 2012-09-16 (×2): 10 meq via INTRAVENOUS
  Filled 2012-09-16 (×2): qty 100

## 2012-09-16 MED ORDER — METHYLPREDNISOLONE SODIUM SUCC 125 MG IJ SOLR
125.0000 mg | Freq: Once | INTRAMUSCULAR | Status: AC
  Administered 2012-09-16: 125 mg via INTRAVENOUS
  Filled 2012-09-16: qty 2

## 2012-09-16 MED ORDER — DEXTROSE 5 % IV SOLN
2.0000 g | INTRAVENOUS | Status: DC
  Administered 2012-09-16: 2 g via INTRAVENOUS
  Filled 2012-09-16 (×2): qty 2

## 2012-09-16 MED ORDER — NOREPINEPHRINE BITARTRATE 1 MG/ML IJ SOLN
2.0000 ug/min | INTRAVENOUS | Status: DC
  Administered 2012-09-16: 5 ug/min via INTRAVENOUS
  Filled 2012-09-16: qty 4

## 2012-09-16 MED ORDER — SODIUM CHLORIDE 0.9 % IV BOLUS (SEPSIS): 1000.0000 mL | Freq: Once | INTRAVENOUS | Status: DC

## 2012-09-16 MED ORDER — VANCOMYCIN HCL IN DEXTROSE 1-5 GM/200ML-% IV SOLN
1000.0000 mg | INTRAVENOUS | Status: DC
  Administered 2012-09-16: 1000 mg via INTRAVENOUS
  Filled 2012-09-16: qty 200

## 2012-09-16 MED ORDER — SODIUM BICARBONATE 8.4 % IV SOLN: 50.0000 meq | Freq: Once | INTRAVENOUS | Status: DC

## 2012-09-16 MED ORDER — LIDOCAINE HCL (PF) 1 % IJ SOLN
INTRAMUSCULAR | Status: AC
  Filled 2012-09-16: qty 5

## 2012-09-16 NOTE — Consult Note (Signed)
NEURO HOSPITALIST CONSULT NOTE    Reason for Consult: neck pain, unsteadiness, confusion.  HPI:                                                                                                                                          Sharon Atkins is an 42 y.o. female with a PMH significant for HTN, hypothyroidism, arthritis, fibromyalgia, hypothyroidism, depression, brought to [facility] ED with complains of worsening neck pain, confusion, and poor balance. Stated that she had had neck pain in the past because of her arthritis, but in the past few days the pain had gotten daily, constant, excruciating, and she can not move her neck without causing severe pain. The pain doesn't go the head but can travel to her arms. No headache, fever, chills, vertigo, double vision, difficulty swallowing, slurred speech, language or vision impairment. Patient's mother reports that she has been " kind of confuse in the last week". Had a fall at home that cause some body bruises but denies injuring her head or neck. While in the ED, she has become hypotensive and clammy requiring IV fluids and use of IV pressors. LP attempted by ED team but unsuccessful and she is getting this done under fluoroscopy. CT head and neck unremarkable. Slight leukocytosis.  Past Medical History  Diagnosis Date  . Thyroid disease     Hypothyroidism  . Goiter   . Depression   . Hypertension   . Fibromyalgia     Past Surgical History  Procedure Laterality Date  . Cesarean section  1995, 2001  . Partial hysterectomy  2009  . Abdominal hysterectomy    . Appendectomy    . Abdominal surgery    . Tonsillectomy  1998    Family History  Problem Relation Age of Onset  . Hypertension Other     Parent     Social History:  reports that she has been smoking.  She has never used smokeless tobacco. She reports that she does not drink alcohol or use illicit drugs.  Allergies  Allergen Reactions  . Hct  (Hydrochlorothiazide)     unknown  . Lyrica (Pregabalin) Rash    MEDICATIONS:  I have reviewed the patient's current medications.   ROS:                                                                                                                                       History obtained from the patient. Mother, and chart review.  General ROS: negative for - chills, fatigue, fever, night sweats, weight gain or weight loss Psychological ROS: negative for - behavioral disorder, hallucinations, memory difficulties, mood swings or suicidal ideation Ophthalmic ROS: negative for - blurry vision, double vision, eye pain or loss of vision ENT ROS: negative for - epistaxis, nasal discharge, oral lesions, sore throat, tinnitus or vertigo Allergy and Immunology ROS: negative for - hives or itchy/watery eyes Hematological and Lymphatic ROS: negative for - bleeding problems, bruising or swollen lymph nodes Endocrine ROS: negative for - galactorrhea, hair pattern changes, polydipsia/polyuria or temperature intolerance Respiratory ROS: negative for - cough, hemoptysis, shortness of breath or wheezing Cardiovascular ROS: negative for - chest pain, dyspnea on exertion, edema or irregular heartbeat Gastrointestinal ROS: negative for - abdominal pain, diarrhea, hematemesis, nausea/vomiting or stool incontinence Genito-Urinary ROS: negative for - dysuria, hematuria, incontinence or urinary frequency/urgency Musculoskeletal ROS: negative for - joint swelling or muscular weakness Neurological ROS: as noted in HPI Dermatological ROS: negative for rash and skin lesion changes     Physical exam: pleasant female in apparent distress due to severe neck pain. Blood pressure 152/56, pulse 63, temperature 97.9 F (36.6 C), temperature source Oral, resp. rate 15, SpO2 100.00%. Head:  normocephalic. Neck: tender on palpation, no bruits, no JVD. No carotidynia. Cardiac: no murmurs. Lungs: clear. Abdomen: soft, no tender, no mass. Extremities: no edema.     Neurologic Examination:                                                                                                      Mental Status: Alert, awake, oriented x 4, thought content appropriate.  Speech fluent without evidence of aphasia.  Able to follow 3 step commands without difficulty. Cranial Nerves: II: Discs flat bilaterally; Visual fields grossly normal, pupils equal, round, reactive to light and accommodation III,IV, VI: ptosis not present, extra-ocular motions intact bilaterally V,VII: smile symmetric, facial light touch sensation normal bilaterally VIII: hearing normal bilaterally IX,X: gag reflex present XI: bilateral shoulder shrug XII: midline tongue extension Motor: Right : Upper extremity   5/5    Left:     Upper extremity   5/5  Lower extremity   5/5  Lower extremity   5/5 Tone and bulk:normal tone throughout; no atrophy noted Sensory: Pinprick and light touch intact throughout, bilaterally Deep Tendon Reflexes: 2+ and symmetric throughout Plantars: Right: downgoing   Left: downgoing Cerebellar: normal finger-to-nose,  normal heel-to-shin test Gait: no tested. CV: pulses palpable throughout    No results found for this basename: cbc, bmp, coags, chol, tri, ldl, hga1c    Results for orders placed during the hospital encounter of 09/16/12 (from the past 48 hour(s))  CBC WITH DIFFERENTIAL     Status: Abnormal   Collection Time    09/16/12  3:58 PM      Result Value Range   WBC 16.2 (*) 4.0 - 10.5 K/uL   RBC 3.72 (*) 3.87 - 5.11 MIL/uL   Hemoglobin 11.7 (*) 12.0 - 15.0 g/dL   HCT 16.1 (*) 09.6 - 04.5 %   MCV 90.1  78.0 - 100.0 fL   MCH 31.5  26.0 - 34.0 pg   MCHC 34.9  30.0 - 36.0 g/dL   RDW 40.9  81.1 - 91.4 %   Platelets 230  150 - 400 K/uL   Neutrophils Relative % 75  43 -  77 %   Neutro Abs 12.2 (*) 1.7 - 7.7 K/uL   Lymphocytes Relative 15  12 - 46 %   Lymphs Abs 2.5  0.7 - 4.0 K/uL   Monocytes Relative 8  3 - 12 %   Monocytes Absolute 1.4 (*) 0.1 - 1.0 K/uL   Eosinophils Relative 1  0 - 5 %   Eosinophils Absolute 0.2  0.0 - 0.7 K/uL   Basophils Relative 0  0 - 1 %   Basophils Absolute 0.0  0.0 - 0.1 K/uL  COMPREHENSIVE METABOLIC PANEL     Status: Abnormal   Collection Time    09/16/12  3:58 PM      Result Value Range   Sodium 137  135 - 145 mEq/L   Potassium 3.0 (*) 3.5 - 5.1 mEq/L   Chloride 104  96 - 112 mEq/L   CO2 17 (*) 19 - 32 mEq/L   Glucose, Bld 91  70 - 99 mg/dL   BUN 39 (*) 6 - 23 mg/dL   Creatinine, Ser 7.82 (*) 0.50 - 1.10 mg/dL   Calcium 8.2 (*) 8.4 - 10.5 mg/dL   Total Protein 6.4  6.0 - 8.3 g/dL   Albumin 3.0 (*) 3.5 - 5.2 g/dL   AST 74 (*) 0 - 37 U/L   ALT 32  0 - 35 U/L   Alkaline Phosphatase 70  39 - 117 U/L   Total Bilirubin 0.4  0.3 - 1.2 mg/dL   GFR calc non Af Amer 10 (*) >90 mL/min   GFR calc Af Amer 11 (*) >90 mL/min   Comment:            The eGFR has been calculated     using the CKD EPI equation.     This calculation has not been     validated in all clinical     situations.     eGFR's persistently     <90 mL/min signify     possible Chronic Kidney Disease.  PROTIME-INR     Status: None   Collection Time    09/16/12  3:58 PM      Result Value Range   Prothrombin Time 14.9  11.6 - 15.2 seconds   INR 1.19  0.00 - 1.49  ETHANOL     Status: None  Collection Time    09/16/12  3:58 PM      Result Value Range   Alcohol, Ethyl (B) <11  0 - 11 mg/dL   Comment:            LOWEST DETECTABLE LIMIT FOR     SERUM ALCOHOL IS 11 mg/dL     FOR MEDICAL PURPOSES ONLY  APTT     Status: Abnormal   Collection Time    09/16/12  3:58 PM      Result Value Range   aPTT 40 (*) 24 - 37 seconds   Comment:            IF BASELINE aPTT IS ELEVATED,     SUGGEST PATIENT RISK ASSESSMENT     BE USED TO DETERMINE APPROPRIATE      ANTICOAGULANT THERAPY.  CG4 I-STAT (LACTIC ACID)     Status: None   Collection Time    09/16/12  4:28 PM      Result Value Range   Lactic Acid, Venous 1.09  0.5 - 2.2 mmol/L  URINALYSIS, ROUTINE W REFLEX MICROSCOPIC     Status: Abnormal   Collection Time    09/16/12  4:57 PM      Result Value Range   Color, Urine AMBER (*) YELLOW   Comment: BIOCHEMICALS MAY BE AFFECTED BY COLOR   APPearance CLOUDY (*) CLEAR   Specific Gravity, Urine 1.022  1.005 - 1.030   pH 5.5  5.0 - 8.0   Glucose, UA NEGATIVE  NEGATIVE mg/dL   Hgb urine dipstick LARGE (*) NEGATIVE   Bilirubin Urine MODERATE (*) NEGATIVE   Ketones, ur NEGATIVE  NEGATIVE mg/dL   Protein, ur 161 (*) NEGATIVE mg/dL   Urobilinogen, UA 0.2  0.0 - 1.0 mg/dL   Nitrite NEGATIVE  NEGATIVE   Leukocytes, UA NEGATIVE  NEGATIVE  URINE RAPID DRUG SCREEN (HOSP PERFORMED)     Status: Abnormal   Collection Time    09/16/12  4:57 PM      Result Value Range   Opiates NONE DETECTED  NONE DETECTED   Cocaine NONE DETECTED  NONE DETECTED   Benzodiazepines POSITIVE (*) NONE DETECTED   Amphetamines POSITIVE (*) NONE DETECTED   Tetrahydrocannabinol NONE DETECTED  NONE DETECTED   Barbiturates NONE DETECTED  NONE DETECTED   Comment:            DRUG SCREEN FOR MEDICAL PURPOSES     ONLY.  IF CONFIRMATION IS NEEDED     FOR ANY PURPOSE, NOTIFY LAB     WITHIN 5 DAYS.                LOWEST DETECTABLE LIMITS     FOR URINE DRUG SCREEN     Drug Class       Cutoff (ng/mL)     Amphetamine      1000     Barbiturate      200     Benzodiazepine   200     Tricyclics       300     Opiates          300     Cocaine          300     THC              50  PREGNANCY, URINE     Status: None   Collection Time    09/16/12  4:57 PM      Result Value Range  Preg Test, Ur NEGATIVE  NEGATIVE   Comment:            THE SENSITIVITY OF THIS     METHODOLOGY IS >20 mIU/mL.  URINE MICROSCOPIC-ADD ON     Status: Abnormal   Collection Time    09/16/12  4:57 PM       Result Value Range   Squamous Epithelial / LPF MANY (*) RARE   WBC, UA 3-6  <3 WBC/hpf   Bacteria, UA MANY (*) RARE   Casts HYALINE CASTS (*) NEGATIVE   Urine-Other MUCOUS PRESENT    POCT PREGNANCY, URINE     Status: None   Collection Time    09/16/12  5:01 PM      Result Value Range   Preg Test, Ur NEGATIVE  NEGATIVE   Comment:            THE SENSITIVITY OF THIS     METHODOLOGY IS >24 mIU/mL    Ct Head Wo Contrast  09/16/2012   *RADIOLOGY REPORT*  Clinical Data:  Larey Seat 3 days ago with trauma to the head and neck.  CT HEAD WITHOUT CONTRAST CT CERVICAL SPINE WITHOUT CONTRAST  Technique:  Multidetector CT imaging of the head and cervical spine was performed following the standard protocol without intravenous contrast.  Multiplanar CT image reconstructions of the cervical spine were also generated.  Comparison:  11/20/2011  CT HEAD  Findings: The brain has a normal appearance without evidence of malformation, atrophy, old or acute infarction, mass lesion, hemorrhage, hydrocephalus or extra-axial collection.  No skull fracture.  No fluid in the sinuses, middle ears or mastoids.  IMPRESSION: Normal head CT  CT CERVICAL SPINE  Findings: Alignment is normal.  No fracture.  No soft tissue swelling.  No degenerative change.  No other focal lesion.  IMPRESSION: Negative CT scan of the cervical spine.   Original Report Authenticated By: Paulina Fusi, M.D.   Ct Cervical Spine Wo Contrast  09/16/2012   *RADIOLOGY REPORT*  Clinical Data:  Larey Seat 3 days ago with trauma to the head and neck.  CT HEAD WITHOUT CONTRAST CT CERVICAL SPINE WITHOUT CONTRAST  Technique:  Multidetector CT imaging of the head and cervical spine was performed following the standard protocol without intravenous contrast.  Multiplanar CT image reconstructions of the cervical spine were also generated.  Comparison:  11/20/2011  CT HEAD  Findings: The brain has a normal appearance without evidence of malformation, atrophy, old or acute infarction,  mass lesion, hemorrhage, hydrocephalus or extra-axial collection.  No skull fracture.  No fluid in the sinuses, middle ears or mastoids.  IMPRESSION: Normal head CT  CT CERVICAL SPINE  Findings: Alignment is normal.  No fracture.  No soft tissue swelling.  No degenerative change.  No other focal lesion.  IMPRESSION: Negative CT scan of the cervical spine.   Original Report Authenticated By: Paulina Fusi, M.D.     Assessment/Plan: 42 years old female with new onset severe neck pain, confusion (although her mental status is normal now), and unsteadiness, with low BP and EKG changes in the ER. She has bruises in her left thigh and small skin lesion in the anterior abdominal wall. Patient's syndrome doesn't look particularly suggestive of CNS infection, and the initially concerning bruises are result from a fall. No carotidynia to suggest neck pain in the context of carotid dissection. Will wait for CSF results and make further recommendations accordingly.     Wyatt Portela, MD [facility] [phone]  09/16/2012, 7:01 PM

## 2012-09-16 NOTE — ED Notes (Signed)
Pt has bruises over her left thigh,left arm and tick bite i her abdomen.

## 2012-09-16 NOTE — Procedures (Signed)
Procedure:  Lumbar puncture under fluoro Findings:  20 G needle puncture at L3/4 yielding clear CSF.  Opening pressure 17-18 cm H2O.  14 mL of CSF collected.

## 2012-09-16 NOTE — ED Notes (Signed)
Pt brought to ED by EMS with hypotension(80/58) and neck pain.Pt states that she had a fall 2 days back in bathroom and hit her head.Pt moving all limbs and GCS 15.

## 2012-09-16 NOTE — Progress Notes (Signed)
Unit CM UR Completed by [facility] ED CM  W. Preslie Depasquale RN  

## 2012-09-16 NOTE — H&P (Signed)
PULMONARY  / CRITICAL CARE MEDICINE  Name: Sharon Atkins MRN: [id number] DOB: Feb 17, 1971    ADMISSION DATE:  09/16/2012 CONSULTATION DATE:  09/16/2012  REFERRING MD :  EDP PRIMARY SERVICE:  PCCM  CHIEF COMPLAINT:  Sepsis  BRIEF PATIENT DESCRIPTION: 42 yo with past medical history of depression, hypothyroidism and fibromyalgia brought to [facility] ED with complaints of weakness and neck pain since 4 days ago.  Also reports tick bite about two weeks ago. In ED: head CT was negative, toxicology screen was positive for amphetamines and benzodiazepines, there were evidence of acute renal failure. Hypotension responded to NS x 3L.  Vasopressors were transiently used but tapered off.  There was an episode of junctuional rhythm with subsequent bradycardia.  LP was attempted but unsuccessfully.  Patient was sent to radiology for image-guided LP. PCCM was consulted.  SIGNIFICANT EVENTS / STUDIES:  6/9  Head CT >>> nad 6/9  C-spine CT >>> nad 6/9  Image-guided LP >>>  LINES / TUBES: Foley 6/9 >>>  CULTURES: CSF  6/9 >>>  ANTIBIOTICS: Ceftriaxone 6/9  >>> Vancomycin 6/9 >>>  The patient is encephalopathic and unable to provide history, which was obtained for available medical records.  HISTORY OF PRESENT ILLNESS:  42 yo with past medical history of depression, hypothyroidism and fibromyalgia brought to [name] with complaints of weakness and neck pain since 4 days ago.  Also reports tick bite about two weeks ago. In ED: head CT was negative, toxicology screen was positive for amphetamines and benzodiazepines, there were evidence of acute renal failure. Hypotension responded to NS x 3L.  Vasopressors were transiently used but tapered off.  There was an episode of junctuional rhythm with subsequent bradycardia.  LP was attempted but unsuccessfully.  Patient was sent to radiology for image-guided LP. PCCM was consulted.  PAST MEDICAL HISTORY :  Past Medical History  Diagnosis Date  . Thyroid disease    Hypothyroidism  . Goiter   . Depression   . Hypertension   . Fibromyalgia    Past Surgical History  Procedure Laterality Date  . Cesarean section  1995, 2001  . Partial hysterectomy  2009  . Abdominal hysterectomy    . Appendectomy    . Abdominal surgery    . Tonsillectomy  1998   Prior to Admission medications   Medication Sig Start Date End Date Taking? Authorizing Provider  cyclobenzaprine (FLEXERIL) 10 MG tablet Take 10 mg by mouth 3 (three) times daily as needed for muscle spasms.   Yes Historical Provider, MD  DULoxetine (CYMBALTA) 30 MG capsule Take 30 mg by mouth daily.   Yes Historical Provider, MD  levothyroxine (SYNTHROID, LEVOTHROID) 88 MCG tablet Take 88 mcg by mouth daily.   Yes Historical Provider, MD  lisinopril (PRINIVIL,ZESTRIL) 10 MG tablet Take 10 mg by mouth daily.   Yes Historical Provider, MD  rosuvastatin (CRESTOR) 20 MG tablet Take 20 mg by mouth daily.   Yes Historical Provider, MD  traMADol (ULTRAM) 50 MG tablet Take 50 mg by mouth every 6 (six) hours as needed for pain.   Yes Historical Provider, MD   Allergies  Allergen Reactions  . Hct (Hydrochlorothiazide)     unknown  . Lyrica (Pregabalin) Rash   FAMILY HISTORY:  Family History  Problem Relation Age of Onset  . Hypertension Other     Parent   SOCIAL HISTORY:  reports that she has been smoking.  She has never used smokeless tobacco. She reports that she does not drink alcohol  or use illicit drugs.  REVIEW OF SYSTEMS:  Unable to provide.  INTERVAL HISTORY:  VITAL SIGNS: Temp:  [97.9 F (36.6 C)] 97.9 F (36.6 C) (06/09 1458) Pulse Rate:  [55-95] 63 (06/09 1853) Resp:  [14-20] 15 (06/09 1845) BP: (74-152)/(38-58) 152/56 mmHg (06/09 1853) SpO2:  [94 %-100 %] 100 % (06/09 1845)  HEMODYNAMICS:   VENTILATOR SETTINGS:   INTAKE / OUTPUT: Intake/Output   None    PHYSICAL EXAMINATION: General:  No acute distress Neuro:  Encephalopathic, nonfocal, cough / gag present, neck soft HEENT:   PERRL Cardiovascular:  Bradycardic, regular Lungs:  Bilateral diminished air entry, no w/r/r Abdomen:  Soft, nontender, bowel sounds diminished, healed small eschar on the anterior abdominal surface, no erythema / induration  Musculoskeletal:  Moves all extremities, no edema Skin:  Intact, ecchymoses arms / thighs  LABS:  Recent Labs Lab 09/16/12 1558 09/16/12 1628  HGB 11.7*  --   WBC 16.2*  --   PLT 230  --   NA 137  --   K 3.0*  --   CL 104  --   CO2 17*  --   GLUCOSE 91  --   BUN 39*  --   CREATININE 5.09*  --   CALCIUM 8.2*  --   AST 74*  --   ALT 32  --   ALKPHOS 70  --   BILITOT 0.4  --   PROT 6.4  --   ALBUMIN 3.0*  --   APTT 40*  --   INR 1.19  --   LATICACIDVEN  --  1.09   No results found for this basename: GLUCAP,  in the last 168 hours  CXR:  None today  ASSESSMENT / PLAN:  PULMONARY A:  No active issues.  P:   Gaol SpO2>92 Supplemental oxygen PRN No indications for artificial airway / mechanical ventilation at this time  CARDIOVASCULAR A: SIRS / sepsis.  Junctional rhythm / bradycardia, etiology?  Lyme carditis possible, but somewhat early? Hypothyroidism. P:  Goal MAP>60 Trend troponin / lactate TTE Hold Lisinopril ID / Endocrine work up as below No indications for CVL / vasopressors for now  Crestor when able  RENAL A:  Acute renal failure.  Dehydration / hypovolemia.  Hypokalemia.  Metabolic acidosis. P:   Trend BMP K 10 x 4 NS@150  NS received 3L in ED will give 1 additional L Mg / Phos  GASTROINTESTINAL A:  No active issues. P:   NPO as encephalopathic. GI Px is not indicated Tylenol level  HEMATOLOGIC A:  Mild anemia. P:  Trend CBC SCDs for DVT Px  INFECTIOUS A:  Possible Lyme disease / carditis.  Meningitis less likely. P:   Cultures and antibiotics as above Given empirical systemic steroids in ED PCT Lime Ab serum / CSF HCV PCR CSF  ENDOCRINE  A:  Hypothyroidism.  P:   ICU Glycemic Control Protocol TSH,  free T4, total T3 Synthroid when able  NEUROLOGIC A:  Acute encephalopathy.  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 P:   Hold Flexeril, Cymbalta  Neurology consulted by ED LP by Interventional Radiology  TODAY'S SUMMARY: Acute renal failure in setting of dehydration / ACEI.  Possible encephalitis / less likely bacterial meningitis.  Possible carditis (Lyme?) with transient junctional rhythm / bradycardia.  Amphetamine / Benzo positive / head CT negative. SIRS / sepsis initially, but improved with fluids. Tonight:  IVF, empirical abx, LP by interventional radiology.  Neurology consulted.  TTE pending.  May need Cardiology consult  in AM.  Family updated.  I have personally obtained a history, examined the patient, evaluated laboratory and imaging results, formulated the assessment and plan and placed orders.  CRITICAL CARE:  The patient is critically ill with multiple organ systems failure and requires high complexity decision making for assessment and support, frequent evaluation and titration of therapies, application of advanced monitoring technologies and extensive interpretation of multiple databases. Critical Care Time devoted to patient care services described in this note is 60 minutes.   Lonia Farber, MD Pulmonary and Critical Care Medicine [facility] Pager: [phone]  09/16/2012, 6:58 PM

## 2012-09-16 NOTE — ED Provider Notes (Signed)
History     CSN: [id number]  Arrival date & time 09/16/12  1443   First MD Initiated Contact with Patient 09/16/12 1503      Chief Complaint  Patient presents with  . Neck Pain    (Consider location/radiation/quality/duration/timing/severity/associated sxs/prior treatment) HPI Pt seen at her PMD's and sent to ED for evaluation of neck pain and hypotension. Pt is poor historian and mother is in the room to add to history. Pt began to c/o neck pain on Thursday which has progressed over the weekend to the point she has difficulty raising head. Mother states pt has become more lethargic for the past 2 days with increased drowsiness. No fever or chills. +tick bite on abd 2 weeks ago with mild surrounding erythema. No focal unilateral weakness or numbness. No N/V HA. No change in chronic medications. Questionable history of trauma form fall in bathroom per RN but pt denies trauma to me. Scattered contusion on inner arms and thighs.  Past Medical History  Diagnosis Date  . Thyroid disease     Hypothyroidism  . Goiter   . Depression   . Hypertension   . Fibromyalgia     Past Surgical History  Procedure Laterality Date  . Cesarean section  1995, 2001  . Partial hysterectomy  2009  . Abdominal hysterectomy    . Appendectomy    . Abdominal surgery    . Tonsillectomy  1998    Family History  Problem Relation Age of Onset  . Hypertension Other     Parent    History  Substance Use Topics  . Smoking status: Current Every Day Smoker -- 1.00 packs/day for 10 years  . Smokeless tobacco: Never Used  . Alcohol Use: No    OB History   Grav Para Term Preterm Abortions TAB SAB Ect Mult Living                  Review of Systems  Constitutional: Positive for fatigue. Negative for fever and chills.  HENT: Positive for neck pain and neck stiffness. Negative for sore throat and rhinorrhea.   Eyes: Negative for photophobia and visual disturbance.  Respiratory: Negative for cough and  shortness of breath.   Cardiovascular: Negative for chest pain and palpitations.  Gastrointestinal: Negative for nausea, vomiting and abdominal pain.  Genitourinary: Negative for dysuria and flank pain.  Musculoskeletal: Positive for myalgias. Negative for arthralgias.  Skin: Positive for rash.  Neurological: Negative for dizziness, weakness, numbness and headaches.  All other systems reviewed and are negative.    Allergies  Hct and Lyrica  Home Medications   Current Outpatient Rx  Name  Route  Sig  Dispense  Refill  . cyclobenzaprine (FLEXERIL) 10 MG tablet   Oral   Take 10 mg by mouth 3 (three) times daily as needed for muscle spasms.         . DULoxetine (CYMBALTA) 30 MG capsule   Oral   Take 30 mg by mouth daily.         Marland Kitchen levothyroxine (SYNTHROID, LEVOTHROID) 88 MCG tablet   Oral   Take 88 mcg by mouth daily.         Marland Kitchen lisinopril (PRINIVIL,ZESTRIL) 10 MG tablet   Oral   Take 10 mg by mouth daily.         . rosuvastatin (CRESTOR) 20 MG tablet   Oral   Take 20 mg by mouth daily.         . traMADol (ULTRAM) 50 MG  tablet   Oral   Take 50 mg by mouth every 6 (six) hours as needed for pain.           BP 85/48  Pulse 88  Temp(Src) 97.9 F (36.6 C) (Oral)  Resp 16  SpO2 95%  Physical Exam  Nursing note and vitals reviewed. Constitutional: She is oriented to person, place, and time. She appears well-developed and well-nourished. No distress.  Pt appears drowsy  HENT:  Head: Normocephalic and atraumatic.  Mouth/Throat: Oropharynx is clear and moist.  Eyes: EOM are normal. Pupils are equal, round, and reactive to light.  Neck:  Very tender cervical paraspinal muscles and midline cervical spine. Difficult to assess stiffness. C-collar placed in ED  Cardiovascular: Normal rate and regular rhythm.   Pulmonary/Chest: Effort normal and breath sounds normal. No respiratory distress. She has no wheezes. She has no rales. She exhibits no tenderness.   Abdominal: Soft. Bowel sounds are normal. She exhibits no distension and no mass. There is no tenderness. There is no rebound and no guarding.  Small pustule to central lower abd with surrounding erythema  Musculoskeletal: Normal range of motion. She exhibits no edema and no tenderness.  Neurological: She is oriented to person, place, and time.  5/5 motor in bl upper ext, 4/5 motor bl lower ext, sensation grossly intact.   Skin: Skin is warm and dry. No rash noted. No erythema.  Psychiatric: She has a normal mood and affect. Her behavior is normal.    ED Course  LUMBAR PUNCTURE Date/Time: 09/16/2012 6:45 PM Performed by: Loren Racer Authorized by: Ranae Palms, Trevelle Mcgurn Consent: Verbal consent obtained. written consent obtained. Risks and benefits: risks, benefits and alternatives were discussed Consent given by: patient Patient understanding: patient states understanding of the procedure being performed Site marked: the operative site was marked Imaging studies: imaging studies available Patient identity confirmed: verbally with patient and arm band Time out: Immediately prior to procedure a "time out" was called to verify the correct patient, procedure, equipment, support staff and site/side marked as required. Indications: evaluation for altered mental status Anesthesia: local infiltration Local anesthetic: lidocaine 1% without epinephrine Anesthetic total: 3 ml Patient sedated: no Preparation: Patient was prepped and draped in the usual sterile fashion. Lumbar space: L3-L4 interspace Patient's position: sitting Needle gauge: 20 Needle type: spinal needle - Quincke tip Post-procedure: pressure dressing applied Patient tolerance: Patient tolerated the procedure well with no immediate complications. Comments: Unsuccessful LP attempt.    (including critical care time)  Labs Reviewed  CBC WITH DIFFERENTIAL - Abnormal; Notable for the following:    WBC 16.2 (*)    RBC 3.72 (*)     Hemoglobin 11.7 (*)    HCT 33.5 (*)    Neutro Abs 12.2 (*)    Monocytes Absolute 1.4 (*)    All other components within normal limits  APTT - Abnormal; Notable for the following:    aPTT 40 (*)    All other components within normal limits  PROTIME-INR  COMPREHENSIVE METABOLIC PANEL  URINALYSIS, ROUTINE W REFLEX MICROSCOPIC  ETHANOL  URINE RAPID DRUG SCREEN (HOSP PERFORMED)  PREGNANCY, URINE  ROCKY MTN SPOTTED FVR AB, IGG-BLOOD  ROCKY MTN SPOTTED FVR AB, IGM-BLOOD  CG4 I-STAT (LACTIC ACID)   Ct Head Wo Contrast  09/16/2012   *RADIOLOGY REPORT*  Clinical Data:  Larey Seat 3 days ago with trauma to the head and neck.  CT HEAD WITHOUT CONTRAST CT CERVICAL SPINE WITHOUT CONTRAST  Technique:  Multidetector CT imaging of the head and  cervical spine was performed following the standard protocol without intravenous contrast.  Multiplanar CT image reconstructions of the cervical spine were also generated.  Comparison:  11/20/2011  CT HEAD  Findings: The brain has a normal appearance without evidence of malformation, atrophy, old or acute infarction, mass lesion, hemorrhage, hydrocephalus or extra-axial collection.  No skull fracture.  No fluid in the sinuses, middle ears or mastoids.  IMPRESSION: Normal head CT  CT CERVICAL SPINE  Findings: Alignment is normal.  No fracture.  No soft tissue swelling.  No degenerative change.  No other focal lesion.  IMPRESSION: Negative CT scan of the cervical spine.   Original Report Authenticated By: Paulina Fusi, M.D.   Ct Cervical Spine Wo Contrast  09/16/2012   *RADIOLOGY REPORT*  Clinical Data:  Larey Seat 3 days ago with trauma to the head and neck.  CT HEAD WITHOUT CONTRAST CT CERVICAL SPINE WITHOUT CONTRAST  Technique:  Multidetector CT imaging of the head and cervical spine was performed following the standard protocol without intravenous contrast.  Multiplanar CT image reconstructions of the cervical spine were also generated.  Comparison:  11/20/2011  CT HEAD  Findings:  The brain has a normal appearance without evidence of malformation, atrophy, old or acute infarction, mass lesion, hemorrhage, hydrocephalus or extra-axial collection.  No skull fracture.  No fluid in the sinuses, middle ears or mastoids.  IMPRESSION: Normal head CT  CT CERVICAL SPINE  Findings: Alignment is normal.  No fracture.  No soft tissue swelling.  No degenerative change.  No other focal lesion.  IMPRESSION: Negative CT scan of the cervical spine.   Original Report Authenticated By: Paulina Fusi, M.D.     No diagnosis found.  CRITICAL CARE Performed by: Ranae Palms, Xinyi Batton Total critical care time: 90 Critical care time was exclusive of separately billable procedures and treating other patients. Critical care was necessary to treat or prevent imminent or life-threatening deterioration. Critical care was time spent personally by me on the following activities: development of treatment plan with patient and/or surrogate as well as nursing, discussions with consultants, evaluation of patient's response to treatment, examination of patient, obtaining history from patient or surrogate, ordering and performing treatments and interventions, ordering and review of laboratory studies, ordering and review of radiographic studies, pulse oximetry and re-evaluation of patient's condition.   Date: 09/17/2012  Rate: 94  Rhythm: normal sinus rhythm  QRS Axis: normal  Intervals: QT prolonged  ST/T Wave abnormalities: normal  Conduction Disutrbances:none  Narrative Interpretation:   Old EKG Reviewed: none available    Date: 09/17/2012  Rate:51  Rhythm: Junctional Rhythm   QRS Axis: normal  Intervals: QRS prolonged  ST/T Wave abnormalities: indeterminate  Conduction Disutrbances:left bundle branch block  Narrative Interpretation:   Old EKG Reviewed: changes noted    MDM  Pt was hypotension was unresponsive to 2 liters NS. Stress dose of steroids given for possible adrenal crisis.  When pt  remained hypotensive, 3 liter of IVF's started and low dose of Levophed.   Neurology consulted and saw pt in ED.   After attempted LP, pt HR dropped to 50's and repeat EKG demonstrated junctional rhythm. BP improved to 140's. Given borderline QT prolongation on intial EKG, development of junctional block and history of depression, 1 amp bicarb given for possible TCA ingestion.   Critical care at bedside. VS now stable. Levophed d/c'd. Pt will get LP under fluoro guidance by radiology.          Loren Racer, MD 09/17/12 [phone]

## 2012-09-16 NOTE — Progress Notes (Signed)
ANTIBIOTIC CONSULT NOTE - INITIAL  Pharmacy Consult for Vancomycin Indication: rule out sepsis  Allergies  Allergen Reactions  . Hct (Hydrochlorothiazide)     unknown  . Lyrica (Pregabalin) Rash    Patient Measurements:   TBW 65kg  Vital Signs: Temp: 97.8 F (36.6 C) (06/09 2036) Temp src: Oral (06/09 2036) BP: 111/76 mmHg (06/09 1900) Pulse Rate: 92 (06/09 1900) Intake/Output from previous day:   Intake/Output from this shift:    Labs:  Recent Labs  09/16/12 1558  WBC 16.2*  HGB 11.7*  PLT 230  CREATININE 5.09*   The CrCl is unknown because both a height and weight (above a minimum accepted value) are required for this calculation. No results found for this basename: VANCOTROUGH, VANCOPEAK, VANCORANDOM, GENTTROUGH, GENTPEAK, GENTRANDOM, TOBRATROUGH, TOBRAPEAK, TOBRARND, AMIKACINPEAK, AMIKACINTROU, AMIKACIN,  in the last 72 hours   Microbiology: No results found for this or any previous visit (from the past 720 hour(s)).  Medical History: Past Medical History  Diagnosis Date  . Thyroid disease     Hypothyroidism  . Goiter   . Depression   . Hypertension   . Fibromyalgia     Assessment: 42yof admitted with 4day Hx neck pain and weakness, and leukocytosis and acute renal failure Cr 5.09 ( Crcl 1ml/min) without fever.  LP performed results pending.  Broad spectrum antibiotics started - vancomycin and ceftriaxone.    Goal of Therapy:  Vancomycin trough level 15-20 mcg/ml  Plan:  Vancomycin 1gm q48hr Monitor renal function  For need to change frequency of dose Ceftriaxone 2gm q24hr  Leota Sauers Pharm.D. CPP, BCPS Clinical Pharmacist [phone] 09/16/2012 9:07 PM

## 2012-09-17 ENCOUNTER — Inpatient Hospital Stay (HOSPITAL_COMMUNITY): Payer: Medicaid Other

## 2012-09-17 DIAGNOSIS — G9340 Encephalopathy, unspecified: Secondary | ICD-10-CM

## 2012-09-17 DIAGNOSIS — R6510 Systemic inflammatory response syndrome (SIRS) of non-infectious origin without acute organ dysfunction: Secondary | ICD-10-CM

## 2012-09-17 DIAGNOSIS — I498 Other specified cardiac arrhythmias: Secondary | ICD-10-CM

## 2012-09-17 DIAGNOSIS — N179 Acute kidney failure, unspecified: Secondary | ICD-10-CM

## 2012-09-17 DIAGNOSIS — IMO0001 Reserved for inherently not codable concepts without codable children: Secondary | ICD-10-CM

## 2012-09-17 DIAGNOSIS — F329 Major depressive disorder, single episode, unspecified: Secondary | ICD-10-CM

## 2012-09-17 DIAGNOSIS — A419 Sepsis, unspecified organism: Secondary | ICD-10-CM

## 2012-09-17 DIAGNOSIS — I059 Rheumatic mitral valve disease, unspecified: Secondary | ICD-10-CM

## 2012-09-17 LAB — COMPREHENSIVE METABOLIC PANEL
ALT: 32 U/L (ref 0–35)
ALT: 32 U/L (ref 0–35)
AST: 45 U/L — ABNORMAL HIGH (ref 0–37)
AST: 45 U/L — ABNORMAL HIGH (ref 0–37)
Albumin: 2.9 g/dL — ABNORMAL LOW (ref 3.5–5.2)
Alkaline Phosphatase: 96 U/L (ref 39–117)
Alkaline Phosphatase: 88 U/L (ref 39–117)
CO2: 17 mEq/L — ABNORMAL LOW (ref 19–32)
Chloride: 111 mEq/L (ref 96–112)
GFR calc Af Amer: 76 mL/min — ABNORMAL LOW (ref >90)
GFR calc non Af Amer: 66 mL/min — ABNORMAL LOW (ref >90)
Glucose, Bld: 121 mg/dL — ABNORMAL HIGH (ref 70–99)
Glucose, Bld: 151 mg/dL — ABNORMAL HIGH (ref 70–99)
Potassium: 4.1 mEq/L (ref 3.5–5.1)
Potassium: 3.6 mEq/L (ref 3.5–5.1)
Sodium: 141 mEq/L (ref 135–145)
Sodium: 139 mEq/L (ref 135–145)
Total Protein: 6.5 g/dL (ref 6.0–8.3)

## 2012-09-17 LAB — CSF CELL COUNT WITH DIFFERENTIAL: Tube #: 1

## 2012-09-17 LAB — LACTIC ACID, PLASMA
Lactic Acid, Venous: 0.9 mmol/L (ref 0.5–2.2)
Lactic Acid, Venous: 1.1 mmol/L (ref 0.5–2.2)

## 2012-09-17 LAB — HERPES SIMPLEX VIRUS(HSV) DNA BY PCR

## 2012-09-17 LAB — T4, FREE: Free T4: 1.22 ng/dL (ref 0.80–1.80)

## 2012-09-17 LAB — CBC
Hemoglobin: 11.7 g/dL — ABNORMAL LOW (ref 12.0–15.0)
MCHC: 34.6 g/dL (ref 30.0–36.0)
Platelets: 231 10*3/uL (ref 150–400)

## 2012-09-17 LAB — TROPONIN I: Troponin I: <0.3 ng/mL (ref <0.30)

## 2012-09-17 LAB — ROCKY MTN SPOTTED FVR AB, IGM-BLOOD: RMSF IgM: 0.29 IV (ref 0.00–0.89)

## 2012-09-17 LAB — ROCKY MTN SPOTTED FVR AB, IGG-BLOOD: RMSF IgG: 0.51 IV

## 2012-09-17 MED ORDER — SODIUM CHLORIDE 0.45 % IV SOLN
INTRAVENOUS | Status: DC
  Administered 2012-09-17 – 2012-09-18 (×2): via INTRAVENOUS

## 2012-09-17 MED ORDER — DOXYCYCLINE HYCLATE 100 MG PO TABS
100.0000 mg | ORAL_TABLET | Freq: Two times a day (BID) | ORAL | Status: DC
  Administered 2012-09-17 – 2012-09-18 (×3): 100 mg via ORAL
  Filled 2012-09-17 (×4): qty 1

## 2012-09-17 MED ORDER — OXYCODONE-ACETAMINOPHEN 5-325 MG PO TABS
1.0000 | ORAL_TABLET | ORAL | Status: DC | PRN
  Administered 2012-09-17 (×2): 2 via ORAL
  Administered 2012-09-17: 1 via ORAL
  Administered 2012-09-18 – 2012-09-19 (×5): 2 via ORAL
  Filled 2012-09-17 (×5): qty 2
  Filled 2012-09-17: qty 1
  Filled 2012-09-17 (×2): qty 2

## 2012-09-17 MED ORDER — DULOXETINE HCL 30 MG PO CPEP
30.0000 mg | ORAL_CAPSULE | Freq: Every day | ORAL | Status: DC
  Administered 2012-09-17 – 2012-09-19 (×3): 30 mg via ORAL
  Filled 2012-09-17 (×4): qty 1

## 2012-09-17 MED ORDER — LIDOCAINE 5 % EX PTCH
1.0000 | MEDICATED_PATCH | CUTANEOUS | Status: DC
  Administered 2012-09-17 – 2012-09-18 (×2): 1 via TRANSDERMAL
  Filled 2012-09-17 (×3): qty 1

## 2012-09-17 MED ORDER — OXYCODONE-ACETAMINOPHEN 5-325 MG PO TABS: 2.0000 | ORAL_TABLET | ORAL | Status: DC | PRN

## 2012-09-17 MED ORDER — TRAMADOL HCL 50 MG PO TABS: 50.0000 mg | ORAL_TABLET | Freq: Four times a day (QID) | ORAL | Status: DC | PRN

## 2012-09-17 NOTE — Progress Notes (Signed)
PULMONARY  / CRITICAL CARE MEDICINE  Name: Sharon Atkins MRN: [id number] DOB: 1970-09-09    ADMISSION DATE:  09/16/2012 CONSULTATION DATE:  09/16/2012  REFERRING MD :  EDP PRIMARY SERVICE:  PCCM  CHIEF COMPLAINT:  Sepsis  BRIEF PATIENT DESCRIPTION: 42 yo with past medical history of depression, hypothyroidism and fibromyalgia brought to [facility] ED with complaints of weakness and neck pain since 4 days ago.  Also reports tick bite about two weeks ago. In ED: head CT was negative, toxicology screen was positive for amphetamines and benzodiazepines, there were evidence of acute renal failure. Hypotension responded to NS x 3L.  Vasopressors were transiently used but tapered off.  There was an episode of junctuional rhythm with subsequent bradycardia.  LP was attempted but unsuccessfully.  Patient was sent to radiology for image-guided LP. PCCM was consulted.  SIGNIFICANT EVENTS / STUDIES:  6/9  Head CT >>> nad 6/9  C-spine CT >>> nad 6/9  Image-guided LP >>> normal opening pressures  LINES / TUBES: - Peripherals Foley 6/9 >>>  CULTURES: CSF  6/9 >>> no organism seen  ANTIBIOTICS: Ceftriaxone 6/9  >>> Vancomycin 6/9 >>>6/10  REVIEW OF SYSTEMS:  Unable to provide.  INTERVAL HISTORY:   VITAL SIGNS: Temp:  [97.3 F (36.3 C)-97.9 F (36.6 C)] 97.5 F (36.4 C) (06/10 0816) Pulse Rate:  [49-95] 64 (06/10 1000) Resp:  [12-23] 14 (06/10 1000) BP: (74-152)/(38-86) 131/86 mmHg (06/10 1000) SpO2:  [93 %-100 %] 100 % (06/10 1000) Weight:  [63.504 kg (140 lb)] 63.504 kg (140 lb) (06/10 0100)  HEMODYNAMICS:   VENTILATOR SETTINGS:   INTAKE / OUTPUT: Intake/Output     06/09 0701 - 06/10 0700 06/10 0701 - 06/11 0700   P.O. 120    I.V. (mL/kg) 600 (9.4) 600 (9.4)   IV Piggyback 2150 0   Total Intake(mL/kg) 2870 (45.2) 600 (9.4)   Urine (mL/kg/hr) 1425    Total Output 1425     Net +1445 +600         PHYSICAL EXAMINATION: General:  No acute distress Neuro: Intact, nonfocal, follows  all commands, cough strong  HEENT:  PERRL, neck soft Cardiovascular:  RRR, +2 pulses. Lungs:  Bilateral diminished air , no w/r/r Abdomen:  Soft, nontender, bowel sounds diminished, healed small eschar on the anterior abdominal surface, no erythema / induration  Musculoskeletal:  Moves all extremities, no edema Skin:  Intact, ecchymoses arms / thighs  LABS:  Recent Labs Lab 09/16/12 1558  09/16/12 1628 09/16/12 2224 09/17/12 0028 09/17/12 0222 09/17/12 0827  HGB 11.7*  --   --   --   --   --  11.7*  WBC 16.2*  --   --   --   --   --  13.9*  PLT 230  --   --   --   --   --  231  NA 137  --   --   --   --   --   --   K 3.0*  --   --   --   --   --   --   CL 104  --   --   --   --   --   --   CO2 17*  --   --   --   --   --   --   GLUCOSE 91  --   --   --   --   --   --   BUN 39*  --   --   --   --   --   --  CREATININE 5.09*  --   --   --   --   --   --   CALCIUM 8.2*  --   --   --   --   --   --   AST 74*  --   --   --   --   --   --   ALT 32  --   --   --   --   --   --   ALKPHOS 70  --   --   --   --   --   --   BILITOT 0.4  --   --   --   --   --   --   PROT 6.4  --   --   --   --   --   --   ALBUMIN 3.0*  --   --   --   --   --   --   APTT 40*  --   --   --   --   --   --   INR 1.19  --   --   --   --   --   --   LATICACIDVEN  --   < > 1.09 0.8  --  0.9 1.1  TROPONINI  --   --   --   --  <0.30  --  <0.30  PROCALCITON  --   --   --  0.41  --   --   --   < > = values in this interval not displayed.  Recent Labs Lab 09/16/12 2036  GLUCAP 149*    CXR:  None  ASSESSMENT / PLAN:  PULMONARY A:  No active issues.  P:   Goal SpO2>92 Supplemental oxygen PRN No indications for artificial airway / mechanical ventilation at this time Ambulate   CARDIOVASCULAR A: SIRS / sepsis - now resolved s/p 3L and brief vasopressor administration.  Junctional rhythm / bradycardia - now resolved in NSR, troponin negative Etiology - Lyme carditis possible, but somewhat early?  Hypothyroidism. Need more info on the type [place] and [place] , [place] P:  -Goal MAP>60 -Trend troponin / lactate - re assuring -TTE -ID / Endocrine work up as below -tele remain  RENAL A:  Acute renal failure may be lab error, vs NSAID etc Odd to resolve from crt 5 in hours Hypokalemia - replacement ordered overnight Metabolic acidosis, NON AG  P:   - Foley for I&0 monitoring - Trend BMP q6H - Follow mag and phos - avoid saline, add 1/2 NS , NON AG - ABG for acid base eval? - Check salicylate levels  GASTROINTESTINAL A:  Transaminatis - mild - AST 74, Albumin 3.0 - tylenol level normal P:   -Start diet - Follow LFTs -Avoid any additional acetamenophen -GI Px is not indicated -Check Tylenol - neg  HEMATOLOGIC A:  Mild anemia. P:  Trend CBC Follow coags - wnl SCDs for DVT Px Ambulate ASAP  INFECTIOUS A:  Leukocytosis - Possible Lyme disease / carditis. Tick bite two weeks ago per family.  Meningitis less likely given CSF results. PCT low P:   Echo to eval for endocarditis Cultures and antibiotics as above Given empirical systemic steroids in ED PCT HSV PCR  and Lime Ab serum /CSF pending, low suspcion Unimpressive LP result Consider empiric doxy as even RMSF ab false neg in this time frame, consider 21 days If bite from [place],  unlikely lyme Consider dc ceftriaxone, vanc given rapid resolution BP after volume No issues of conduction noted after volume  ENDOCRINE  A:  Hypothyroidism - tsh normal  P:   ICU Glycemic Control Protocol Synthroid when able  NEUROLOGIC A:  Acute encephalopathy - now improved with a negative neurological exam and intact mental status. Neck pain - persevered ROM - Possible Amphetamine / Benzodiazepine abuse, although patient denies. P:   Lidoderm patches for neck pain Avoid sedating medications for now Hold PO meds for now Monitor neurological exam Neurology following - may need imaging of the neck r/t pain  Restart  some hoe meds   TODAY'S SUMMARY: Acute renal failure in setting of hypotension, dehydration and altered mental status.  This improved however, with IVF administration.  Now neurologically intact with no focal deficits. TTE still pending. LP essentially negative.  Checking CK for appropriate clearance.  To triad To floor  09/17/2012, 10:07 AM  Mcarthur Rossetti. Tyson Alias, MD, FACP Pgr: [phone] [facility]

## 2012-09-17 NOTE — Progress Notes (Signed)
  Echocardiogram 2D Echocardiogram has been performed.  Sharon Atkins FRANCES 09/17/2012, 3:49 PM

## 2012-09-17 NOTE — Progress Notes (Signed)
NEURO HOSPITALIST PROGRESS NOTE   SUBJECTIVE:                                                                                                                        Still complaining of neck pain. CSF: WBC 2, RBC 196 Protein 52 Glucose 80 Denies headache, vertigo, double vision, difficulty swallowing or speaking, slurred speech, language or vision impairment. Reports a tick bite 2 weeks ago. On ceftriaxone, vancomycin. Received solumedrol 125 mg IV X 1 dose. Remains afebrile.     OBJECTIVE:                                                                                                                           Vital signs in last 24 hours: Temp:  [97.3 F (36.3 C)-97.9 F (36.6 C)] 97.8 F (36.6 C) (06/10 0400) Pulse Rate:  [51-95] 51 (06/10 0500) Resp:  [12-23] 14 (06/10 0500) BP: (74-152)/(38-84) 103/70 mmHg (06/10 0500) SpO2:  [94 %-100 %] 100 % (06/10 0500) Weight:  [63.504 kg (140 lb)] 63.504 kg (140 lb) (06/10 0100)  Intake/Output from previous day: 06/09 0701 - 06/10 0700 In: 2870 [P.O.:120; I.V.:600; IV Piggyback:2150] Out: 1425 [Urine:1425] Intake/Output this shift:   Nutritional status: NPO  Past Medical History  Diagnosis Date  . Thyroid disease     Hypothyroidism  . Goiter   . Depression   . Hypertension   . Fibromyalgia    Physical exam: significant for neck tenderness on palpation. She is afebrile Neurologic Exam:  Mental Status: Alert, awake, oriented x 4, thought content appropriate. Comprehension, naming, and repetition intact. Speech fluent without evidence of aphasia.  Able to follow 3 step commands without difficulty. Cranial Nerves: II: Discs flat bilaterally; Visual fields grossly normal, pupils equal, round, reactive to light and accommodation III,IV, VI: ptosis not present, extra-ocular motions intact bilaterally V,VII: smile symmetric, facial light touch sensation normal bilaterally VIII: hearing  normal bilaterally IX,X: gag reflex present XI: bilateral shoulder shrug XII: midline tongue extension Motor: Right : Upper extremity   5/5    Left:     Upper extremity   5/5  Lower extremity   5/5     Lower extremity   5/5 Tone and bulk:normal tone throughout; no  atrophy noted Sensory: Pinprick and light touch intact throughout, bilaterally Deep Tendon Reflexes: 2+ and symmetric throughout Plantars: Right: downgoing   Left: downgoing Cerebellar: normal finger-to-nose,  normal heel-to-shin test Gait: no tested. CV: pulses palpable throughout    Lab Results: No results found for this basename: cbc, bmp, coags, chol, tri, ldl, hga1c   Lipid Panel No results found for this basename: CHOL, TRIG, HDL, CHOLHDL, VLDL, LDLCALC,  in the last 72 hours  Studies/Results: Ct Head Wo Contrast  09/16/2012   *RADIOLOGY REPORT*  Clinical Data:  Larey Seat 3 days ago with trauma to the head and neck.  CT HEAD WITHOUT CONTRAST CT CERVICAL SPINE WITHOUT CONTRAST  Technique:  Multidetector CT imaging of the head and cervical spine was performed following the standard protocol without intravenous contrast.  Multiplanar CT image reconstructions of the cervical spine were also generated.  Comparison:  11/20/2011  CT HEAD  Findings: The brain has a normal appearance without evidence of malformation, atrophy, old or acute infarction, mass lesion, hemorrhage, hydrocephalus or extra-axial collection.  No skull fracture.  No fluid in the sinuses, middle ears or mastoids.  IMPRESSION: Normal head CT  CT CERVICAL SPINE  Findings: Alignment is normal.  No fracture.  No soft tissue swelling.  No degenerative change.  No other focal lesion.  IMPRESSION: Negative CT scan of the cervical spine.   Original Report Authenticated By: Paulina Fusi, M.D.   Ct Cervical Spine Wo Contrast  09/16/2012   *RADIOLOGY REPORT*  Clinical Data:  Larey Seat 3 days ago with trauma to the head and neck.  CT HEAD WITHOUT CONTRAST CT CERVICAL SPINE WITHOUT  CONTRAST  Technique:  Multidetector CT imaging of the head and cervical spine was performed following the standard protocol without intravenous contrast.  Multiplanar CT image reconstructions of the cervical spine were also generated.  Comparison:  11/20/2011  CT HEAD  Findings: The brain has a normal appearance without evidence of malformation, atrophy, old or acute infarction, mass lesion, hemorrhage, hydrocephalus or extra-axial collection.  No skull fracture.  No fluid in the sinuses, middle ears or mastoids.  IMPRESSION: Normal head CT  CT CERVICAL SPINE  Findings: Alignment is normal.  No fracture.  No soft tissue swelling.  No degenerative change.  No other focal lesion.  IMPRESSION: Negative CT scan of the cervical spine.   Original Report Authenticated By: Paulina Fusi, M.D.   Dg Lumbar Puncture Fluoro Guide  09/16/2012   *RADIOLOGY REPORT*  Clinical Data:  Neck pain, lower extremity weakness and hypotension.  DIAGNOSTIC LUMBAR PUNCTURE UNDER FLUOROSCOPIC GUIDANCE  Fluoroscopy time:  33 seconds.  Technique:  Informed consent was obtained from the patient prior to the procedure, including potential complications of headache, allergy, and pain.   With the patient prone, the lower back was prepped with Betadine.  1% Lidocaine was used for local anesthesia. Lumbar puncture was performed at the L3-4 level using a 20 gauge needle with return of clear CSF with an opening pressure of 17 - 18 cm water.   14 ml of CSF were obtained for laboratory studies.  The patient tolerated the procedure well and there were no apparent complications.  IMPRESSION: Lumbar puncture performed under fluoroscopy with collection of CSF for laboratory studies.  Opening CSF pressure was within normal limits.   Original Report Authenticated By: Irish Lack, M.D.    MEDICATIONS  I have reviewed the patient's current  medications.  ASSESSMENT/PLAN:                                                                                                           Persistent neck pain (she seems to be more comfortable than yesterday) of unclear etiology, but recent tick bite and cardiac changes raised concern for possible early Lyme carditis. Her CSF doesn't look infectious at this moment and her neuro-exam is non focal with preserved mental status. Already on Ceftriaxone and Vancomycin. Will continue to follow and decide whether or not further neuroimaging of her neck is warranted.  Wyatt Portela, MD [facility] [phone]  09/17/2012, 8:03 AM

## 2012-09-17 NOTE — Progress Notes (Signed)
Pt transferred to 2033 via wheelchair on monitor with RN, VSS, NAD, pt denies pain, unit RN at bedside to place pt on unit telemetry monitor, informed the RN of location of licodaine patch on pt's mid back & that pt is to go for MRI for neck pain but MRI has not called for pt yet, receiving RN notified of pt's arrival

## 2012-09-18 DIAGNOSIS — E039 Hypothyroidism, unspecified: Secondary | ICD-10-CM

## 2012-09-18 LAB — URINE CULTURE: Colony Count: NO GROWTH

## 2012-09-18 LAB — PROCALCITONIN: Procalcitonin: 0.15 ng/mL

## 2012-09-18 LAB — BASIC METABOLIC PANEL
BUN: 20 mg/dL (ref 6–23)
CO2: 16 mEq/L — ABNORMAL LOW (ref 19–32)
Chloride: 112 mEq/L (ref 96–112)
Creatinine, Ser: 0.97 mg/dL (ref 0.50–1.10)
Potassium: 3.7 mEq/L (ref 3.5–5.1)

## 2012-09-18 LAB — CBC
HCT: 31.1 % — ABNORMAL LOW (ref 36.0–46.0)
Hemoglobin: 10.8 g/dL — ABNORMAL LOW (ref 12.0–15.0)
MCH: 31.4 pg (ref 26.0–34.0)
MCHC: 34.7 g/dL (ref 30.0–36.0)
RBC: 3.44 MIL/uL — ABNORMAL LOW (ref 3.87–5.11)

## 2012-09-18 LAB — MAGNESIUM: Magnesium: 2 mg/dL (ref 1.5–2.5)

## 2012-09-18 LAB — B. BURGDORFI ANTIBODIES: B burgdorferi Ab IgG+IgM: 0.33 {ISR}

## 2012-09-18 MED ORDER — LEVOTHYROXINE SODIUM 88 MCG PO TABS
88.0000 ug | ORAL_TABLET | Freq: Every day | ORAL | Status: DC
  Administered 2012-09-19: 88 ug via ORAL
  Filled 2012-09-18 (×2): qty 1

## 2012-09-18 NOTE — Care Management Note (Unsigned)
    Page 1 of 1   09/18/2012     4:38:44 PM   CARE MANAGEMENT NOTE 09/18/2012  Patient:  Sharon Atkins, Sharon Atkins   Account Number:  [id number]  Date Initiated:  09/18/2012  Documentation initiated by:  Yesennia Hirota  Subjective/Objective Assessment:   PT ADM WITH SEPSIS, ? LYME DISEASE ON 09/16/12.  PTA, PT LIVES AT HOME WITH FAMILY.     Action/Plan:   WILL FOLLOW FOR HOME NEEDS AS PT PROGRESSES.   Anticipated DC Date:  09/19/2012   Anticipated DC Plan:  HOME/SELF CARE      DC Planning Services  CM consult      Choice offered to / List presented to:             Status of service:  In process, will continue to follow Medicare Important Message given?   (If response is "NO", the following Medicare IM given date fields will be blank) Date Medicare IM given:   Date Additional Medicare IM given:    Discharge Disposition:    Per UR Regulation:  Reviewed for med. necessity/level of care/duration of stay  If discussed at Long Length of Stay Meetings, dates discussed:    Comments:

## 2012-09-18 NOTE — Progress Notes (Signed)
NEURO HOSPITALIST PROGRESS NOTE   SUBJECTIVE:                                                                                                                        Mild neck discomfort.  Patient admitted to having a fall about two weeks ago which is when the neck pain started.   OBJECTIVE:                                                                                                                           Vital signs in last 24 hours: Temp:  [97.2 F (36.2 C)-97.7 F (36.5 C)] 97.4 F (36.3 C) (06/11 0520) Pulse Rate:  [63-96] 66 (06/11 0520) Resp:  [14-22] 16 (06/11 0520) BP: (115-142)/(77-114) 131/91 mmHg (06/11 0520) SpO2:  [94 %-100 %] 94 % (06/11 0520) Weight:  [67.495 kg (148 lb 12.8 oz)] 67.495 kg (148 lb 12.8 oz) (06/11 0520)  Intake/Output from previous day: 06/10 0701 - 06/11 0700 In: 1560 [P.O.:360; I.V.:1200] Out: -  Intake/Output this shift:   Nutritional status: General  Past Medical History  Diagnosis Date  . Thyroid disease     Hypothyroidism  . Goiter   . Depression   . Hypertension   . Fibromyalgia       Neurologic Exam:  Mental Status: Alert, oriented, thought content appropriate.  Speech fluent without evidence of aphasia.  Able to follow 3 step commands without difficulty. Cranial Nerves: II: Visual fields grossly normal, pupils equal, round, reactive to light and accommodation III,IV, VI: ptosis not present, extra-ocular motions intact bilaterally V,VII: smile symmetric, facial light touch sensation normal bilaterally VIII: hearing normal bilaterally IX,X: gag reflex present XI: bilateral shoulder shrug, no nuchal rigidity, supple neck and full ROM of neck motion.  XII: midline tongue extension Motor: Right : Upper extremity   5/5    Left:     Upper extremity   5/5  Lower extremity   5/5     Lower extremity   5/5 Tone and bulk:normal tone throughout; no atrophy noted Sensory: Pinprick and light  touch intact throughout, bilaterally Deep Tendon Reflexes: 2+ and symmetric throughout Plantars: Right: downgoing   Left: downgoing Cerebellar: normal finger-to-nose,  normal heel-to-shin test Gait: not tested  CV: pulses palpable throughout    Lab Results: No results found for this basename: cbc, bmp, coags, chol, tri, ldl, hga1c   Lipid Panel No results found for this basename: CHOL, TRIG, HDL, CHOLHDL, VLDL, LDLCALC,  in the last 72 hours  Studies/Results: Ct Head Wo Contrast  09/16/2012   *RADIOLOGY REPORT*  Clinical Data:  Larey Seat 3 days ago with trauma to the head and neck.  CT HEAD WITHOUT CONTRAST CT CERVICAL SPINE WITHOUT CONTRAST  Technique:  Multidetector CT imaging of the head and cervical spine was performed following the standard protocol without intravenous contrast.  Multiplanar CT image reconstructions of the cervical spine were also generated.  Comparison:  11/20/2011  CT HEAD  Findings: The brain has a normal appearance without evidence of malformation, atrophy, old or acute infarction, mass lesion, hemorrhage, hydrocephalus or extra-axial collection.  No skull fracture.  No fluid in the sinuses, middle ears or mastoids.  IMPRESSION: Normal head CT  CT CERVICAL SPINE  Findings: Alignment is normal.  No fracture.  No soft tissue swelling.  No degenerative change.  No other focal lesion.  IMPRESSION: Negative CT scan of the cervical spine.   Original Report Authenticated By: Paulina Fusi, M.D.   Ct Cervical Spine Wo Contrast  09/16/2012   *RADIOLOGY REPORT*  Clinical Data:  Larey Seat 3 days ago with trauma to the head and neck.  CT HEAD WITHOUT CONTRAST CT CERVICAL SPINE WITHOUT CONTRAST  Technique:  Multidetector CT imaging of the head and cervical spine was performed following the standard protocol without intravenous contrast.  Multiplanar CT image reconstructions of the cervical spine were also generated.  Comparison:  11/20/2011  CT HEAD  Findings: The brain has a normal appearance  without evidence of malformation, atrophy, old or acute infarction, mass lesion, hemorrhage, hydrocephalus or extra-axial collection.  No skull fracture.  No fluid in the sinuses, middle ears or mastoids.  IMPRESSION: Normal head CT  CT CERVICAL SPINE  Findings: Alignment is normal.  No fracture.  No soft tissue swelling.  No degenerative change.  No other focal lesion.  IMPRESSION: Negative CT scan of the cervical spine.   Original Report Authenticated By: Paulina Fusi, M.D.   Mr Brain Wo Contrast  09/17/2012   *RADIOLOGY REPORT*  Clinical Data: Weakness and neck pain for 5 days.  Recent fall. Tic bite 2 weeks ago.  MRI HEAD WITHOUT CONTRAST  Technique:  Multiplanar, multiecho pulse sequences of the brain and surrounding structures were obtained according to standard protocol without intravenous contrast.  Comparison: 11/16/2012 head CT.  Findings: No acute infarct.  No intracranial hemorrhage.  No MR findings of encephalitis.  If this remains a high clinical concern correlation with cerebrospinal fluid analysis may be considered.  No intracranial mass lesion detected on this unenhanced exam.  No hydrocephalus.  Major intracranial vascular structures are patent.  Cervical medullary junction, pituitary region, pineal region and orbital structures unremarkable.  IMPRESSION: No acute abnormality.  Please see above.   Original Report Authenticated By: Lacy Duverney, M.D.   Mr Cervical Spine Wo Contrast  09/17/2012   *RADIOLOGY REPORT*  Clinical Data: Weakness and neck pain for 5 days after fall.  Tic bite 2 weeks ago.  MRI CERVICAL SPINE WITHOUT CONTRAST  Technique:  Multiplanar and multiecho pulse sequences of the cervical spine, to include the craniocervical junction and cervicothoracic junction, were obtained according to standard protocol without intravenous contrast.  Comparison: Cervical spine CT 09/16/2012.  Cervical spine MR 05/11/2005.  Brain MR same date.  Findings: Edema within paraspinal soft tissue  extending from the occiput to C7 level suggestive of result of soft tissue injury from recent fall.  Myositis from tic bite is felt to be a less likely consideration.  No bone edema.  Normal alignment of the cervical spine.  No edema at the level of the anterior longitudinal ligament.  No focal cord signal abnormality.  Both vertebral arteries are patent.  C2-3:  Negative.  C3-4:  Negative.  C4-5:  Mild bulge/spur.  Mild spinal stenosis.  Minimal cord deflection.  C5-6:  Minimal bulge greater to the right.  Minimal right foraminal narrowing.  C6-7:  Negative.  C7-T1:  Negative.  IMPRESSION: Edema within paraspinal soft tissue extending from the occiput to C7 level suggestive of result of soft tissue injury from recent fall.  Minimal to mild cervical spondylotic changes most notable C4-5 as detailed above.  Critical Value/emergent results were called by telephone at the time of interpretation on 09/17/2012 at 10:50 p.m. to [facility] the patient's nurse, who verbally acknowledged these results.   Original Report Authenticated By: Lacy Duverney, M.D.   Dg Lumbar Puncture Fluoro Guide  09/16/2012   *RADIOLOGY REPORT*  Clinical Data:  Neck pain, lower extremity weakness and hypotension.  DIAGNOSTIC LUMBAR PUNCTURE UNDER FLUOROSCOPIC GUIDANCE  Fluoroscopy time:  33 seconds.  Technique:  Informed consent was obtained from the patient prior to the procedure, including potential complications of headache, allergy, and pain.   With the patient prone, the lower back was prepped with Betadine.  1% Lidocaine was used for local anesthesia. Lumbar puncture was performed at the L3-4 level using a 20 gauge needle with return of clear CSF with an opening pressure of 17 - 18 cm water.   14 ml of CSF were obtained for laboratory studies.  The patient tolerated the procedure well and there were no apparent complications.  IMPRESSION: Lumbar puncture performed under fluoroscopy with collection of CSF for laboratory studies.  Opening CSF  pressure was within normal limits.   Original Report Authenticated By: Irish Lack, M.D.   Results for JERZEE, JEROME (MRN [id number]) as of 09/18/2012 09:50  Ref. Range 09/16/2012 20:18  Glucose, CSF Latest Range: 43-76 mg/dL 80 (H)  Total  Protein, CSF Latest Range: 15-45 mg/dL 52 (H)  RBC Count, CSF Latest Range: 0 /cu mm 196 (H)  WBC, CSF Latest Range: 0-5 /cu mm 2  Segmented Neutrophils-CSF Latest Range: 0-6 % TOO FEW TO COUNT, SMEAR AVAILABLE FOR REVIEW  Appearance, CSF Latest Range: CLEAR  CLEAR  Color, CSF Latest Range: COLORLESS  COLORLESS  Supernatant No range found NOT INDICATED  Tube # No range found 1   MEDICATIONS                                                                                                                        Scheduled: . doxycycline  100 mg Oral Q12H  . DULoxetine  30 mg Oral Daily  . lidocaine  1 patch Transdermal  Q24H  . sodium chloride  1,000 mL Intravenous Once  . sodium chloride  500 mL Intravenous Once    ASSESSMENT/PLAN:                                                                                                             Persistent neck discomfort with MRI C-spine validating paraspinal inflammation likely secondary to recent fall.  No bone edema.  Normal alignment of the cervical spine.  No edema at the level of the anterior longitudinal ligament.  No focal cord signal abnormality noted. Neuro exam remains non focal and patient is alert and oriented. PCCM has consulted ID for possible Lyme disease/carditis.   Recommend: 1) PT/OT/NSAIDS for neck discomfort.  2) Agree with ID consult.  3) No further recommendations per neurology at this time.   Neurology will sign off on this patient's care at this point.   Felicie Morn PA-C [facility] [phone]  I approved the above clinical assessment and management recommendations.  Venetia Maxon M.D. [facility] [phone]  09/18/2012, 9:45 AM

## 2012-09-18 NOTE — Progress Notes (Signed)
PULMONARY  / CRITICAL CARE MEDICINE  Name: Sharon Atkins MRN: [id number] DOB: [birth year]    ADMISSION DATE:  09/16/2012 CONSULTATION DATE:  09/16/2012  REFERRING MD :  EDP PRIMARY SERVICE:  PCCM  CHIEF COMPLAINT:  Sepsis  BRIEF PATIENT DESCRIPTION: 42 yo with past medical history of depression, hypothyroidism and fibromyalgia brought to [facility] ED with complaints of weakness and neck pain since 4 days ago.  Also reports tick bite about two weeks ago. In ED: head CT was negative, toxicology screen was positive for amphetamines and benzodiazepines, there were evidence of acute renal failure. Hypotension responded to NS x 3L.  Vasopressors were transiently used but tapered off.  There was an episode of junctuional rhythm with subsequent bradycardia.  LP was attempted but unsuccessfully.  Patient was sent to radiology for image-guided LP. PCCM was consulted.  SIGNIFICANT EVENTS / STUDIES:  6/9  Head CT >>> nad 6/9  C-spine CT >>> nad 6/9  Image-guided LP >>> normal opening pressures 6/10 MRI neck>>>Edema within paraspinal soft tissue extending from the occiput to C7 level suggestive of result of soft tissue injury from recent fall. Minimal to mild cervical spondylotic changes most notable C4-5 as detailed above. 6/10 MRI brain>>>No acute abnormality. Please see above. 6/10 2D echo>>> EF 60-65%, normal syst and diastolic function, mild MR, otherwise normal  LINES / TUBES: - Peripherals Foley 6/9 >>>out  CULTURES: CSF  6/9 >>> no organism seen  ANTIBIOTICS: Ceftriaxone 6/9  >>>6/10 Vancomycin 6/9 >>>6/10 Doxycycline 6/10>>>  REVIEW OF SYSTEMS:  Unable to provide.  Subjective:  No new c/o.  Still some neck pain but better. Denies headache, lightheadedness, neck stiffness.  Does remember a fall at home.   VITAL SIGNS: Temp:  [97.2 F (36.2 C)-97.7 F (36.5 C)] 97.4 F (36.3 C) (06/11 0520) Pulse Rate:  [63-96] 66 (06/11 0520) Resp:  [14-22] 16 (06/11 0520) BP: (115-142)/(77-114)  131/91 mmHg (06/11 0520) SpO2:  [94 %-100 %] 94 % (06/11 0520) Weight:  [148 lb 12.8 oz (67.495 kg)] 148 lb 12.8 oz (67.495 kg) (06/11 0520)  HEMODYNAMICS:   VENTILATOR SETTINGS:   INTAKE / OUTPUT: Intake/Output     06/10 0701 - 06/11 0700 06/11 0701 - 06/12 0700   P.O. 360    I.V. (mL/kg) 1200 (17.8)    IV Piggyback 0    Total Intake(mL/kg) 1560 (23.1)    Urine (mL/kg/hr)     Total Output       Net +1560          Urine Occurrence 1 x     PHYSICAL EXAMINATION: General:  No acute distress Neuro: Intact, nonfocal, follows all commands, cough strong  HEENT:  PERRL, neck soft, full ROM Cardiovascular:  RRR, +2 pulses. Lungs:  Bilateral diminished air , no w/r/r Abdomen:  Soft, nontender, bowel sounds diminished Musculoskeletal:  Moves all extremities, no edema Skin:  Intact, ecchymoses arms / thighs  LABS:  Recent Labs Lab 09/16/12 1558  09/16/12 2224 09/17/12 0028 09/17/12 0222 09/17/12 0827 09/17/12 1158 09/17/12 1648 09/18/12 0505  HGB 11.7*  --   --   --   --  11.7*  --   --  10.8*  WBC 16.2*  --   --   --   --  13.9*  --   --  17.0*  PLT 230  --   --   --   --  231  --   --  252  NA 137  --   --   --   --   --  141 139 140  K 3.0*  --   --   --   --   --  4.1 3.6 3.7  CL 104  --   --   --   --   --  114* 111 112  CO2 17*  --   --   --   --   --  16* 17* 16*  GLUCOSE 91  --   --   --   --   --  121* 151* 121*  BUN 39*  --   --   --   --   --  20 20 20   CREATININE 5.09*  --   --   --   --   --  1.21* 1.03 0.97  CALCIUM 8.2*  --   --   --   --   --  8.4 8.7 8.7  MG  --   --   --   --   --   --  2.0 1.9 2.0  PHOS  --   --   --   --   --   --   --   --  2.0*  AST 74*  --   --   --   --   --  45* 45*  --   ALT 32  --   --   --   --   --  32 32  --   ALKPHOS 70  --   --   --   --   --  96 88  --   BILITOT 0.4  --   --   --   --   --  0.2* 0.2*  --   PROT 6.4  --   --   --   --   --  6.5 6.2  --   ALBUMIN 3.0*  --   --   --   --   --  2.9* 2.8*  --   APTT 40*   --   --   --   --   --   --   --   --   INR 1.19  --   --   --   --   --   --   --   --   LATICACIDVEN  --   < > 0.8  --  0.9 1.1  --   --   --   TROPONINI  --   --   --  <0.30  --  <0.30 <0.30  --   --   PROCALCITON  --   --  0.41  --   --  0.23  --   --  0.15  < > = values in this interval not displayed.  Recent Labs Lab 09/16/12 2036  GLUCAP 149*    CXR:  None  ASSESSMENT / PLAN:  PULMONARY A:  No active issues.  P:    CARDIOVASCULAR A: SIRS / sepsis - now resolved s/p 3L volume and brief vasopressor administration.  Junctional rhythm / bradycardia - now resolved in NSR, troponin negative.  Likely r/t volume.  Etiology - Lyme carditis possible, but somewhat early? And incidence in [place] practically zero. Hypothyroidism. P:  -Goal MAP>60 -Trend troponin / lactate - re assuring -TTE - essentially normal  -ID / Endocrine work up as below   RENAL A:  Acute renal failure may be lab error, vs NSAID etc Odd to resolve from crt 5 so quickly  Hypokalemia - replacement  ordered overnight Metabolic acidosis, NON AG  P:   - cont monitor i/o - f/u chem in am  - Follow mag and phos - KVO fluids   GASTROINTESTINAL A:  Transaminatis - mild - AST 74, Albumin 3.0 - tylenol level normal P:   -cont PO diet  - Follow LFTs -Avoid any additional acetamenophen -GI Px is not indicated -f/u cmet in am   HEMATOLOGIC A:  Mild anemia. P:  Trend CBC Follow coags - wnl SCDs for DVT Px Ambulate  INFECTIOUS A:  Leukocytosis - Possible Lyme disease / carditis. Tick bite two weeks ago per family.  Meningitis less likely given CSF results. PCT low. If bite from [place], unlikely lyme P:   Echo neg veg Cultures and antibiotics as above HSV PCR  and Lime Ab serum /CSF pending, low suspicion Unimpressive LP result Consider empiric doxy as even RMSF ab false neg in this time frame, consider 21 days, unclear whether truly indicated >> ID to see 6/11  ENDOCRINE  A:  Hypothyroidism - tsh normal   P:   ICU Glycemic Control Protocol Resume home synthroid 6/11  NEUROLOGIC A:  Acute encephalopathy - now improved with a negative neurological exam and intact mental status. Neck pain - preserved ROM.  Soft tissue swelling on MRI - likely r/t fall.  - Possible Amphetamine / Benzodiazepine abuse, although patient denies, UDS was POS P:   Lidoderm patches for neck pain Avoid sedating medications for now  Will await ID consult, likely ready for d/c home 6/12  [facility], NP 09/18/2012  9:13 AM Pager: [phone] or [phone]  *Care during the described time interval was provided by me and/or other providers on the critical care team. I have reviewed this patient's available data, including medical history, events of note, physical examination and test results as part of my evaluation.  Levy Pupa, MD, PhD 09/18/2012, 11:14 AM [facility] [phone] or if no answer [phone]

## 2012-09-18 NOTE — Progress Notes (Signed)
Received a call concerning abnormal MRI Spine results which indicated some soft tissue injury.  Placed a call to the  physician on-call to notify them of finding.  Will continue to monitor.  Arva Chafe

## 2012-09-18 NOTE — Consult Note (Signed)
[facility]  Total days of antibiotics 3        Day 2 doxycycline        ( 1 dose of ctx)       Reason for Consult: possible RMSF    Referring Physician: feinstein  Active Problems:   SIRS (systemic inflammatory response syndrome)   Sepsis   Junctional bradycardia   AKI (acute kidney injury)   Acute encephalopathy    HPI: Sharon Atkins is a 42 y.o. female with history of depression, hypothyroidism and fibromyalgia brought to [facility] ED with complaints of weakness and neck pain since 4 days ago. She does report falling in a shower recently and sustaining bruises to ribs and also hitting her head. In ED: head CT was negative, toxicology screen was positive for amphetamines and benzodiazepines, there were evidence of acute renal failure. Hypotension responded to NS x 3L. Vasopressors were transiently used but tapered off. Underwent LP which found 2 wbc, TP 52 and glu 80. No organisms of gram stain. She was started on doxycycline for rickettsial infection since she report having a tick bit to her abdomen 4 wks ago hwere she removed hte tick but still has small scab and redness at the bite site. On admit, her wbc was 16 with normal diff, 230 plt, aki of cr 1.27, normal transaminases . RMSF titers drawn were negative. HSV pcr negative.   ID consulted to determine if this presentation could be RMSF and if there is need for further doxycycline.   Past Medical History  Diagnosis Date  . Thyroid disease     Hypothyroidism  . Goiter   . Depression   . Hypertension   . Fibromyalgia     Allergies:  Allergies  Allergen Reactions  . Hct (Hydrochlorothiazide)     unknown  . Lyrica (Pregabalin) Rash    MEDICATIONS: . DULoxetine  30 mg Oral Daily  . [START ON 09/19/2012] levothyroxine  88 mcg Oral QAC breakfast  . lidocaine  1 patch Transdermal Q24H  . sodium chloride  1,000 mL Intravenous Once  . sodium chloride  500 mL Intravenous Once    History    Substance Use Topics  . Smoking status: Current Every Day Smoker -- 1.00 packs/day for 10 years  . Smokeless tobacco: Never Used  . Alcohol Use: No    Family History  Problem Relation Age of Onset  . Hypertension Other     Parent   Review of Systems  Constitutional: Negative for fever, chills, diaphoresis, activity change, appetite change, fatigue and unexpected weight change.  HENT: Negative for congestion, sore throat, rhinorrhea, sneezing, trouble swallowing and sinus pressure.  Eyes: Negative for photophobia and visual disturbance.  Respiratory: Negative for cough, chest tightness, shortness of breath, wheezing and stridor.  Cardiovascular: Negative for chest pain, palpitations and leg swelling.  Gastrointestinal: Negative for nausea, vomiting, abdominal pain, diarrhea, constipation, blood in stool, abdominal distention and anal bleeding.  Genitourinary: Negative for dysuria, hematuria, flank pain and difficulty urinating.  Musculoskeletal: pain from recent ground level fall in the shower Skin: Negative for color change, pallor, rash and wound.  Neurological: Negative for dizziness, tremors, weakness and light-headedness.  Hematological: Negative for adenopathy. Does not bruise/bleed easily.  Psychiatric/Behavioral: Negative for behavioral problems, confusion, sleep disturbance, dysphoric mood, decreased concentration and agitation.     OBJECTIVE: Temp:  [97.4 F (36.3 C)-97.7 F (36.5 C)] 97.5 F (36.4 C) (06/11 1935) Pulse Rate:  [63-77] 75 (06/11 1935) Resp:  [  16-18] 18 (06/11 1935) BP: (115-137)/(77-91) 137/90 mmHg (06/11 1935) SpO2:  [94 %-98 %] 97 % (06/11 1935) Weight:  [148 lb 12.8 oz (67.495 kg)] 148 lb 12.8 oz (67.495 kg) (06/11 0520)  General: No acute distress  Neuro: a o by 4 in NAD, 5/5 motor in all appendages, sensation intact, CS 2-12 grossly intact HEENT: PERRL  Neck: no nuchal rigidity Cardiovascular: Bradycardic, regular  Lungs: Bilateral diminished  air entry, no w/r/r  Abdomen: Soft, nontender, bowel sounds diminished, healed small eschar on the anterior abdominal surface, no erythema / induration  Skin: no rash, ecchymoses arms / thighs      LABS: Results for orders placed during the hospital encounter of 09/16/12 (from the past 48 hour(s))  B. BURGDORFI ANTIBODIES     Status: None   Collection Time    09/16/12 10:24 PM      Result Value Range   B burgdorferi Ab IgG+IgM 0.33     Comment: (NOTE)     Antibody to Borrelia burgdorferi not detected.       ISR = Immune Status Ratio                 <0.90         ISR       Negative                 0.90 - 1.09   ISR       Equivocal                 >=1.10        ISR       Positive  ACETAMINOPHEN LEVEL     Status: None   Collection Time    09/16/12 10:24 PM      Result Value Range   Acetaminophen (Tylenol), Serum <15.0  10 - 30 ug/mL   Comment:            THERAPEUTIC CONCENTRATIONS VARY     SIGNIFICANTLY. A RANGE OF 10-30     ug/mL MAY BE AN EFFECTIVE     CONCENTRATION FOR MANY PATIENTS.     HOWEVER, SOME ARE BEST TREATED     AT CONCENTRATIONS OUTSIDE THIS     RANGE.     ACETAMINOPHEN CONCENTRATIONS     >150 ug/mL AT 4 HOURS AFTER     INGESTION AND >50 ug/mL AT 12     HOURS AFTER INGESTION ARE     OFTEN ASSOCIATED WITH TOXIC     REACTIONS.  T4, FREE     Status: None   Collection Time    09/16/12 10:24 PM      Result Value Range   Free T4 1.22  0.80 - 1.80 ng/dL  T3     Status: None   Collection Time    09/16/12 10:24 PM      Result Value Range   T3, Total 81.0  80.0 - 204.0 ng/dl  LACTIC ACID, PLASMA     Status: None   Collection Time    09/16/12 10:24 PM      Result Value Range   Lactic Acid, Venous 0.8  0.5 - 2.2 mmol/L  PROCALCITONIN     Status: None   Collection Time    09/16/12 10:24 PM      Result Value Range   Procalcitonin 0.41     Comment:            Interpretation:     PCT (Procalcitonin) <= 0.5 ng/mL:  Systemic infection (sepsis) is not likely.       Local bacterial infection is possible.     (NOTE)             ICU PCT Algorithm               Non ICU PCT Algorithm        ----------------------------     ------------------------------             PCT < 0.25 ng/mL                 PCT < 0.1 ng/mL         Stopping of antibiotics            Stopping of antibiotics           strongly encouraged.               strongly encouraged.        ----------------------------     ------------------------------           PCT level decrease by               PCT < 0.25 ng/mL           >= 80% from peak PCT           OR PCT 0.25 - 0.5 ng/mL          Stopping of antibiotics                                                 encouraged.         Stopping of antibiotics               encouraged.        ----------------------------     ------------------------------           PCT level decrease by              PCT >= 0.25 ng/mL           < 80% from peak PCT            AND PCT >= 0.5 ng/mL            Continuing antibiotics                                                  encouraged.           Continuing antibiotics                encouraged.        ----------------------------     ------------------------------         PCT level increase compared          PCT > 0.5 ng/mL             with peak PCT AND              PCT >= 0.5 ng/mL             Escalation of antibiotics  strongly encouraged.          Escalation of antibiotics            strongly encouraged.  TROPONIN I     Status: None   Collection Time    09/17/12 12:28 AM      Result Value Range   Troponin I <0.30  <0.30 ng/mL   Comment:            Due to the release kinetics of cTnI,     a negative result within the first hours     of the onset of symptoms does not rule out     myocardial infarction with certainty.     If myocardial infarction is still suspected,     repeat the test at appropriate intervals.  LACTIC ACID, PLASMA     Status: None    Collection Time    09/17/12  2:22 AM      Result Value Range   Lactic Acid, Venous 0.9  0.5 - 2.2 mmol/L  PROCALCITONIN     Status: None   Collection Time    09/17/12  8:27 AM      Result Value Range   Procalcitonin 0.23     Comment:            Interpretation:     PCT (Procalcitonin) <= 0.5 ng/mL:     Systemic infection (sepsis) is not likely.     Local bacterial infection is possible.     (NOTE)             ICU PCT Algorithm               Non ICU PCT Algorithm        ----------------------------     ------------------------------             PCT < 0.25 ng/mL                 PCT < 0.1 ng/mL         Stopping of antibiotics            Stopping of antibiotics           strongly encouraged.               strongly encouraged.        ----------------------------     ------------------------------           PCT level decrease by               PCT < 0.25 ng/mL           >= 80% from peak PCT           OR PCT 0.25 - 0.5 ng/mL          Stopping of antibiotics                                                 encouraged.         Stopping of antibiotics               encouraged.        ----------------------------     ------------------------------           PCT level decrease by              PCT >= 0.25 ng/mL           <  80% from peak PCT            AND PCT >= 0.5 ng/mL            Continuing antibiotics                                                  encouraged.           Continuing antibiotics                encouraged.        ----------------------------     ------------------------------         PCT level increase compared          PCT > 0.5 ng/mL             with peak PCT AND              PCT >= 0.5 ng/mL             Escalation of antibiotics                                              strongly encouraged.          Escalation of antibiotics            strongly encouraged.  TROPONIN I     Status: None   Collection Time    09/17/12  8:27 AM      Result Value Range   Troponin I  <0.30  <0.30 ng/mL   Comment:            Due to the release kinetics of cTnI,     a negative result within the first hours     of the onset of symptoms does not rule out     myocardial infarction with certainty.     If myocardial infarction is still suspected,     repeat the test at appropriate intervals.  LACTIC ACID, PLASMA     Status: None   Collection Time    09/17/12  8:27 AM      Result Value Range   Lactic Acid, Venous 1.1  0.5 - 2.2 mmol/L  CBC     Status: Abnormal   Collection Time    09/17/12  8:27 AM      Result Value Range   WBC 13.9 (*) 4.0 - 10.5 K/uL   RBC 3.72 (*) 3.87 - 5.11 MIL/uL   Hemoglobin 11.7 (*) 12.0 - 15.0 g/dL   HCT 16.1 (*) 09.6 - 04.5 %   MCV 90.9  78.0 - 100.0 fL   MCH 31.5  26.0 - 34.0 pg   MCHC 34.6  30.0 - 36.0 g/dL   RDW 40.9  81.1 - 91.4 %   Platelets 231  150 - 400 K/uL  TROPONIN I     Status: None   Collection Time    09/17/12 11:58 AM      Result Value Range   Troponin I <0.30  <0.30 ng/mL   Comment:            Due to the release kinetics of cTnI,     a negative result within the first hours     of the  onset of symptoms does not rule out     myocardial infarction with certainty.     If myocardial infarction is still suspected,     repeat the test at appropriate intervals.  COMPREHENSIVE METABOLIC PANEL     Status: Abnormal   Collection Time    09/17/12 11:58 AM      Result Value Range   Sodium 141  135 - 145 mEq/L   Potassium 4.1  3.5 - 5.1 mEq/L   Chloride 114 (*) 96 - 112 mEq/L   CO2 16 (*) 19 - 32 mEq/L   Glucose, Bld 121 (*) 70 - 99 mg/dL   BUN 20  6 - 23 mg/dL   Comment: DELTA CHECK NOTED     RESULTS VERIFIED VIA RECOLLECT   Creatinine, Ser 1.21 (*) 0.50 - 1.10 mg/dL   Comment: DELTA CHECK NOTED     RESULTS VERIFIED VIA RECOLLECT   Calcium 8.4  8.4 - 10.5 mg/dL   Total Protein 6.5  6.0 - 8.3 g/dL   Albumin 2.9 (*) 3.5 - 5.2 g/dL   AST 45 (*) 0 - 37 U/L   ALT 32  0 - 35 U/L   Alkaline Phosphatase 96  39 - 117 U/L    Total Bilirubin 0.2 (*) 0.3 - 1.2 mg/dL   GFR calc non Af Amer 54 (*) >90 mL/min   GFR calc Af Amer 63 (*) >90 mL/min   Comment:            The eGFR has been calculated     using the CKD EPI equation.     This calculation has not been     validated in all clinical     situations.     eGFR's persistently     <90 mL/min signify     possible Chronic Kidney Disease.  MAGNESIUM     Status: None   Collection Time    09/17/12 11:58 AM      Result Value Range   Magnesium 2.0  1.5 - 2.5 mg/dL  SALICYLATE LEVEL     Status: Abnormal   Collection Time    09/17/12  4:48 PM      Result Value Range   Salicylate Lvl <2.0 (*) 2.8 - 20.0 mg/dL  ACETAMINOPHEN LEVEL     Status: None   Collection Time    09/17/12  4:48 PM      Result Value Range   Acetaminophen (Tylenol), Serum <15.0  10 - 30 ug/mL   Comment:            THERAPEUTIC CONCENTRATIONS VARY     SIGNIFICANTLY. A RANGE OF 10-30     ug/mL MAY BE AN EFFECTIVE     CONCENTRATION FOR MANY PATIENTS.     HOWEVER, SOME ARE BEST TREATED     AT CONCENTRATIONS OUTSIDE THIS     RANGE.     ACETAMINOPHEN CONCENTRATIONS     >150 ug/mL AT 4 HOURS AFTER     INGESTION AND >50 ug/mL AT 12     HOURS AFTER INGESTION ARE     OFTEN ASSOCIATED WITH TOXIC     REACTIONS.  COMPREHENSIVE METABOLIC PANEL     Status: Abnormal   Collection Time    09/17/12  4:48 PM      Result Value Range   Sodium 139  135 - 145 mEq/L   Potassium 3.6  3.5 - 5.1 mEq/L   Chloride 111  96 - 112 mEq/L   CO2 17 (*)  19 - 32 mEq/L   Glucose, Bld 151 (*) 70 - 99 mg/dL   BUN 20  6 - 23 mg/dL   Creatinine, Ser 4.09  0.50 - 1.10 mg/dL   Calcium 8.7  8.4 - 81.1 mg/dL   Total Protein 6.2  6.0 - 8.3 g/dL   Albumin 2.8 (*) 3.5 - 5.2 g/dL   AST 45 (*) 0 - 37 U/L   ALT 32  0 - 35 U/L   Alkaline Phosphatase 88  39 - 117 U/L   Total Bilirubin 0.2 (*) 0.3 - 1.2 mg/dL   GFR calc non Af Amer 66 (*) >90 mL/min   GFR calc Af Amer 76 (*) >90 mL/min   Comment:            The eGFR has been  calculated     using the CKD EPI equation.     This calculation has not been     validated in all clinical     situations.     eGFR's persistently     <90 mL/min signify     possible Chronic Kidney Disease.  MAGNESIUM     Status: None   Collection Time    09/17/12  4:48 PM      Result Value Range   Magnesium 1.9  1.5 - 2.5 mg/dL  CK     Status: Abnormal   Collection Time    09/17/12  4:48 PM      Result Value Range   Total CK 1907 (*) 7 - 177 U/L  CK     Status: Abnormal   Collection Time    09/17/12 10:45 PM      Result Value Range   Total CK 1792 (*) 7 - 177 U/L  CK     Status: Abnormal   Collection Time    09/18/12  5:05 AM      Result Value Range   Total CK 1088 (*) 7 - 177 U/L  BASIC METABOLIC PANEL     Status: Abnormal   Collection Time    09/18/12  5:05 AM      Result Value Range   Sodium 140  135 - 145 mEq/L   Potassium 3.7  3.5 - 5.1 mEq/L   Chloride 112  96 - 112 mEq/L   CO2 16 (*) 19 - 32 mEq/L   Glucose, Bld 121 (*) 70 - 99 mg/dL   BUN 20  6 - 23 mg/dL   Creatinine, Ser 9.14  0.50 - 1.10 mg/dL   Calcium 8.7  8.4 - 78.2 mg/dL   GFR calc non Af Amer 71 (*) >90 mL/min   GFR calc Af Amer 82 (*) >90 mL/min   Comment:            The eGFR has been calculated     using the CKD EPI equation.     This calculation has not been     validated in all clinical     situations.     eGFR's persistently     <90 mL/min signify     possible Chronic Kidney Disease.  MAGNESIUM     Status: None   Collection Time    09/18/12  5:05 AM      Result Value Range   Magnesium 2.0  1.5 - 2.5 mg/dL  CBC     Status: Abnormal   Collection Time    09/18/12  5:05 AM      Result Value Range   WBC 17.0 (*)  4.0 - 10.5 K/uL   RBC 3.44 (*) 3.87 - 5.11 MIL/uL   Hemoglobin 10.8 (*) 12.0 - 15.0 g/dL   HCT 16.1 (*) 09.6 - 04.5 %   MCV 90.4  78.0 - 100.0 fL   MCH 31.4  26.0 - 34.0 pg   MCHC 34.7  30.0 - 36.0 g/dL   RDW 40.9  81.1 - 91.4 %   Platelets 252  150 - 400 K/uL  PHOSPHORUS      Status: Abnormal   Collection Time    09/18/12  5:05 AM      Result Value Range   Phosphorus 2.0 (*) 2.3 - 4.6 mg/dL  PROCALCITONIN     Status: None   Collection Time    09/18/12  5:05 AM      Result Value Range   Procalcitonin 0.15     Comment:            Interpretation:     PCT (Procalcitonin) <= 0.5 ng/mL:     Systemic infection (sepsis) is not likely.     Local bacterial infection is possible.     (NOTE)             ICU PCT Algorithm               Non ICU PCT Algorithm        ----------------------------     ------------------------------             PCT < 0.25 ng/mL                 PCT < 0.1 ng/mL         Stopping of antibiotics            Stopping of antibiotics           strongly encouraged.               strongly encouraged.        ----------------------------     ------------------------------           PCT level decrease by               PCT < 0.25 ng/mL           >= 80% from peak PCT           OR PCT 0.25 - 0.5 ng/mL          Stopping of antibiotics                                                 encouraged.         Stopping of antibiotics               encouraged.        ----------------------------     ------------------------------           PCT level decrease by              PCT >= 0.25 ng/mL           < 80% from peak PCT            AND PCT >= 0.5 ng/mL            Continuing antibiotics  encouraged.           Continuing antibiotics                encouraged.        ----------------------------     ------------------------------         PCT level increase compared          PCT > 0.5 ng/mL             with peak PCT AND              PCT >= 0.5 ng/mL             Escalation of antibiotics                                              strongly encouraged.          Escalation of antibiotics            strongly encouraged.  CK     Status: Abnormal   Collection Time    09/18/12 10:50 AM      Result Value Range    Total CK 808 (*) 7 - 177 U/L    MICRO:  IMAGING: Mr Brain Wo Contrast  09/17/2012   *RADIOLOGY REPORT*  Clinical Data: Weakness and neck pain for 5 days.  Recent fall. Tic bite 2 weeks ago.  MRI HEAD WITHOUT CONTRAST  Technique:  Multiplanar, multiecho pulse sequences of the brain and surrounding structures were obtained according to standard protocol without intravenous contrast.  Comparison: 11/16/2012 head CT.  Findings: No acute infarct.  No intracranial hemorrhage.  No MR findings of encephalitis.  If this remains a high clinical concern correlation with cerebrospinal fluid analysis may be considered.  No intracranial mass lesion detected on this unenhanced exam.  No hydrocephalus.  Major intracranial vascular structures are patent.  Cervical medullary junction, pituitary region, pineal region and orbital structures unremarkable.  IMPRESSION: No acute abnormality.  Please see above.   Original Report Authenticated By: Lacy Duverney, M.D.   Mr Cervical Spine Wo Contrast  09/17/2012   *RADIOLOGY REPORT*  Clinical Data: Weakness and neck pain for 5 days after fall.  Tic bite 2 weeks ago.  MRI CERVICAL SPINE WITHOUT CONTRAST  Technique:  Multiplanar and multiecho pulse sequences of the cervical spine, to include the craniocervical junction and cervicothoracic junction, were obtained according to standard protocol without intravenous contrast.  Comparison: Cervical spine CT 09/16/2012.  Cervical spine MR 05/11/2005.  Brain MR same date.  Findings: Edema within paraspinal soft tissue extending from the occiput to C7 level suggestive of result of soft tissue injury from recent fall.  Myositis from tic bite is felt to be a less likely consideration.  No bone edema.  Normal alignment of the cervical spine.  No edema at the level of the anterior longitudinal ligament.  No focal cord signal abnormality.  Both vertebral arteries are patent.  C2-3:  Negative.  C3-4:  Negative.  C4-5:  Mild bulge/spur.  Mild spinal  stenosis.  Minimal cord deflection.  C5-6:  Minimal bulge greater to the right.  Minimal right foraminal narrowing.  C6-7:  Negative.  C7-T1:  Negative.  IMPRESSION: Edema within paraspinal soft tissue extending from the occiput to C7 level suggestive of result of soft tissue injury from recent fall.  Minimal to mild cervical spondylotic changes most  notable C4-5 as detailed above.  Critical Value/emergent results were called by telephone at the time of interpretation on 09/17/2012 at 10:50 p.m. to [facility] the patient's nurse, who verbally acknowledged these results.   Original Report Authenticated By: Lacy Duverney, M.D.    Assessment/Plan:  42yo F presents with encephalopathy concerning for rickettsial infection. CSF is blan. Serologies for RMSF and lyme are negative. - clinical history suggests that cause of her presentation maybe due to recent trauma - csf and serology testing are reassuring for not being consistent with RMSF or lyme disease - will discontinue doxycycline since there is no need at this time.  Duke Salvia Drue Second MD MPH [facility] [phone]

## 2012-09-18 NOTE — Progress Notes (Signed)
Pt amb 1xA with NT. Pt with no complaints. Will continue to monitor pt closely.

## 2012-09-19 LAB — COMPREHENSIVE METABOLIC PANEL
ALT: 39 U/L — ABNORMAL HIGH (ref 0–35)
AST: 31 U/L (ref 0–37)
CO2: 21 mEq/L (ref 19–32)
Chloride: 114 mEq/L — ABNORMAL HIGH (ref 96–112)
GFR calc Af Amer: 89 mL/min — ABNORMAL LOW (ref >90)
GFR calc non Af Amer: 77 mL/min — ABNORMAL LOW (ref >90)
Glucose, Bld: 111 mg/dL — ABNORMAL HIGH (ref 70–99)
Sodium: 144 mEq/L (ref 135–145)
Total Bilirubin: 0.1 mg/dL — ABNORMAL LOW (ref 0.3–1.2)

## 2012-09-19 MED ORDER — OXYCODONE-ACETAMINOPHEN 5-325 MG PO TABS: 1.0000 | ORAL_TABLET | Freq: Four times a day (QID) | ORAL | Status: DC | PRN

## 2012-09-19 NOTE — Discharge Summary (Signed)
Physician Discharge Summary  Patient ID: Sharon Atkins MRN: [id number] DOB/AGE: 43-May-1972 43 y.o.  Admit date: 09/16/2012 Discharge date: 09/19/2012    Discharge Diagnoses:  Active Problems:   SIRS (systemic inflammatory response syndrome)   Sepsis   Junctional bradycardia   AKI (acute kidney injury)   Acute encephalopathy    Brief Summary: Sharon Atkins is a 42 yo with past medical history of depression, hypothyroidism and fibromyalgia brought to [facility] ED with complaints of weakness and neck pain x 4 days prior to admit. Had reported tick bite in early May in [place]. In ED: head CT was negative, toxicology screen was positive for amphetamines and benzodiazepines, there were evidence of acute renal failure. Hypotension responded to NS x 3L. Vasopressors were transiently used but tapered off. There was an episode of junctional rhythm with subsequent bradycardia.  Hypotension and arrythmia both resolved quickly with fluid resuscitation. LP was performed by IR and did not appear infectious.  Neuro exam remained non focal, neck remained supple with full ROM and MRI neck showed soft tissue swelling likely r/t recent fall in the shower per pt.    Consults: ID Neuro   SIGNIFICANT EVENTS / STUDIES:  6/9 Head CT >>> nad  6/9 C-spine CT >>> nad  6/9 Image-guided LP >>> normal opening pressures  6/10 MRI neck>>>Edema within paraspinal soft tissue extending from the occiput to C7 level suggestive of result of soft tissue injury from recent fall. Minimal to mild cervical spondylotic changes most notable C4-5 as detailed above.  6/10 MRI brain>>>No acute abnormality. Please see above.  6/10 2D echo>>> EF 60-65%, normal syst and diastolic function, mild MR, otherwise normal   LINES / TUBES:  - Peripherals  Foley 6/9 >>>out   CULTURES:  CSF 6/9 >>> no organism seen  RMSF 6/10>>>neg HSV PCR 6/10>>>Neg   ANTIBIOTICS:  Ceftriaxone 6/9 >>>6/10  Vancomycin 6/9 >>>6/10  Doxycycline  6/10>>>6/12                                                                     Hospital Summary by Discharge Diagnosis  Encephalopathy/ Leukocytosis - Resolved.  Concerning for rickettsial infection, meningitis less likely given CSF results and clinical exam. 2D echo negative for vegetation.  LP done, CSF did not appear infectious.  RMSF titer neg.  HSV neg.  Of note pts UDS was pos for benzos and amphetamines although she denies any drug use.  She was initially started on vanc, ceftriaxone, then transitioned to doxycyline for ?RMSF.  Followed by ID who felt CSF and serologies reassuring for not being RMSF or lymes, especially given unlikelihood of lymes from a tick in [place].  Doxy was d/c and pt needs no further abx.  ??If all of this was r/t drug use or some mild rhabdo after a fall.    Neck pain - also see above.  MRI neck as above validating paraspinal inflammation likely secondary to recent fall.  Pt endorses fall in the shower approx 1 week prior to admit.  MRI head neg.  PRN pain rx.   SIRS/Hypotension - resolved quickly with fluids and brief vasopressors and BP has remained WNL.  Appears related to dehydration.  Troponin negative, lactate and pct unimpressive.  WIll d/c home off  ACE-i given renal failure and mild rhabdo.  Likely resume as outpt if f/u labs ok.  Will need f/u bmet as outpt.   Junctional rhythm/bradycardia -- Also resolved with fluids and brief pressors. Troponin neg.  Pt has remained in NSR. 2D echo essentially negative.    Acute renal failure -- ?lab error v NSAID v drugs.  Resolved extremely quickly from creatinine of 5 which seems to be more likely r/t lab error but certainly could have been purely r/t volume.  Will need f/u BMET   Transaminitis -- mild, improved.  Tylenol, asa levels normal.  Crestor held, cont to hold for now on d/c pending f/u LFTs as outpt.   Filed Vitals:   09/18/12 1300 09/18/12 1935 09/19/12 0500 09/19/12 0538  BP: 134/90 137/90  138/61  Pulse: 77  75  76  Temp: 97.7 F (36.5 C) 97.5 F (36.4 C)  98.5 F (36.9 C)  TempSrc: Oral Oral  Oral  Resp: 16 18  18   Height:      Weight:   153 lb (69.4 kg)   SpO2: 98% 97%  95%     Discharge Labs  BMET  Recent Labs Lab 09/16/12 1558 09/17/12 1158 09/17/12 1648 09/18/12 0505 09/19/12 0615  NA 137 141 139 140 144  K 3.0* 4.1 3.6 3.7 3.6  CL 104 114* 111 112 114*  CO2 17* 16* 17* 16* 21  GLUCOSE 91 121* 151* 121* 111*  BUN 39* 20 20 20 15   CREATININE 5.09* 1.21* 1.03 0.97 0.90  CALCIUM 8.2* 8.4 8.7 8.7 8.9  MG  --  2.0 1.9 2.0  --   PHOS  --   --   --  2.0*  --      CBC   Recent Labs Lab 09/16/12 1558 09/17/12 0827 09/18/12 0505  HGB 11.7* 11.7* 10.8*  HCT 33.5* 33.8* 31.1*  WBC 16.2* 13.9* 17.0*  PLT 230 231 252   Anti-Coagulation  Recent Labs Lab 09/16/12 1558  INR 1.19      Discharge Orders   Future Orders Complete By Expires     Diet - low sodium heart healthy  As directed     Increase activity slowly  As directed             Follow-up Information   Follow up with Lahoma Crocker, MD On 10/02/2012. (8:30am )    Contact information:   [address] [place] [address] [phone]       Follow up with SPENCER,SARA C, PA-C On 09/23/2012. (4:00pm )    Contact information:   [address], [address] [place] [address]          Medication List    STOP taking these medications       cyclobenzaprine 10 MG tablet  Commonly known as:  FLEXERIL     lisinopril 10 MG tablet  Commonly known as:  PRINIVIL,ZESTRIL     rosuvastatin 20 MG tablet  Commonly known as:  CRESTOR      TAKE these medications       DULoxetine 30 MG capsule  Commonly known as:  CYMBALTA  Take 30 mg by mouth daily.     levothyroxine 88 MCG tablet  Commonly known as:  SYNTHROID, LEVOTHROID  Take 88 mcg by mouth daily.     oxyCODONE-acetaminophen 5-325 MG per tablet  Commonly known as:  PERCOCET/ROXICET  Take 1-2 tablets by mouth every 6  (six) hours as needed for pain.  traMADol 50 MG tablet  Commonly known as:  ULTRAM  Take 50 mg by mouth every 6 (six) hours as needed for pain.          Disposition: 01-Home or Self Care  Discharged Condition: Sharon Atkins has met maximum benefit of inpatient care and is medically stable and cleared for discharge.  Patient is pending follow up as above.      Time spent on disposition:  Greater than 35 minutes.   SignedDanford Bad, NP 09/19/2012  9:59 AM Pager: [phone] or [phone]  *Care during the described time interval was provided by me and/or other providers on the critical care team. I have reviewed this patient's available data, including medical history, events of note, physical examination and test results as part of my evaluation.     Levy Pupa, MD, PhD 09/19/2012, 11:05 AM [facility] [phone] or if no answer [phone]

## 2012-09-19 NOTE — Progress Notes (Signed)
Assessment unchanged. Discussed D/C instructions with pt. RX given to pt. Verbalized understanding. IV and tele removed. Pt left via W/C accompanied by [employer].

## 2012-09-20 LAB — CSF CULTURE W GRAM STAIN: Culture: NO GROWTH

## 2012-10-16 ENCOUNTER — Ambulatory Visit: Payer: Self-pay | Admitting: Neurology

## 2012-11-03 ENCOUNTER — Emergency Department (HOSPITAL_COMMUNITY)
Admission: EM | Admit: 2012-11-03 | Discharge: 2012-11-03 | Disposition: A | Discharge status: Home / Self Care | Payer: Medicaid Other | Attending: Emergency Medicine | Admitting: Emergency Medicine

## 2012-11-03 ENCOUNTER — Encounter (HOSPITAL_COMMUNITY): Payer: Self-pay | Admitting: Emergency Medicine

## 2012-11-03 DIAGNOSIS — Z862 Personal history of diseases of the blood and blood-forming organs and certain disorders involving the immune mechanism: Secondary | ICD-10-CM | POA: Insufficient documentation

## 2012-11-03 DIAGNOSIS — E039 Hypothyroidism, unspecified: Secondary | ICD-10-CM | POA: Insufficient documentation

## 2012-11-03 DIAGNOSIS — Z8639 Personal history of other endocrine, nutritional and metabolic disease: Secondary | ICD-10-CM | POA: Insufficient documentation

## 2012-11-03 DIAGNOSIS — F3289 Other specified depressive episodes: Secondary | ICD-10-CM | POA: Insufficient documentation

## 2012-11-03 DIAGNOSIS — I959 Hypotension, unspecified: Secondary | ICD-10-CM

## 2012-11-03 DIAGNOSIS — F329 Major depressive disorder, single episode, unspecified: Secondary | ICD-10-CM | POA: Insufficient documentation

## 2012-11-03 DIAGNOSIS — F17200 Nicotine dependence, unspecified, uncomplicated: Secondary | ICD-10-CM | POA: Insufficient documentation

## 2012-11-03 DIAGNOSIS — IMO0001 Reserved for inherently not codable concepts without codable children: Secondary | ICD-10-CM | POA: Insufficient documentation

## 2012-11-03 DIAGNOSIS — Z79899 Other long term (current) drug therapy: Secondary | ICD-10-CM | POA: Insufficient documentation

## 2012-11-03 DIAGNOSIS — R51 Headache: Secondary | ICD-10-CM | POA: Insufficient documentation

## 2012-11-03 DIAGNOSIS — I10 Essential (primary) hypertension: Secondary | ICD-10-CM | POA: Insufficient documentation

## 2012-11-03 DIAGNOSIS — Z3202 Encounter for pregnancy test, result negative: Secondary | ICD-10-CM | POA: Insufficient documentation

## 2012-11-03 LAB — POCT I-STAT TROPONIN I: Troponin i, poc: 0.01 ng/mL (ref 0.00–0.08)

## 2012-11-03 LAB — BASIC METABOLIC PANEL
BUN: 5 mg/dL — ABNORMAL LOW (ref 6–23)
GFR calc Af Amer: >90 mL/min (ref >90)
GFR calc non Af Amer: >90 mL/min (ref >90)
Potassium: 3.8 mEq/L (ref 3.5–5.1)
Sodium: 133 mEq/L — ABNORMAL LOW (ref 135–145)

## 2012-11-03 LAB — CBC WITH DIFFERENTIAL/PLATELET
Basophils Absolute: 0 10*3/uL (ref 0.0–0.1)
Basophils Relative: 0 % (ref 0–1)
Eosinophils Absolute: 0.4 10*3/uL (ref 0.0–0.7)
MCH: 31.9 pg (ref 26.0–34.0)
MCHC: 34.7 g/dL (ref 30.0–36.0)
Neutrophils Relative %: 71 % (ref 43–77)
Platelets: 379 10*3/uL (ref 150–400)
RDW: 14.5 % (ref 11.5–15.5)

## 2012-11-03 LAB — URINALYSIS, ROUTINE W REFLEX MICROSCOPIC
Glucose, UA: NEGATIVE mg/dL
Ketones, ur: NEGATIVE mg/dL
Leukocytes, UA: NEGATIVE
Protein, ur: NEGATIVE mg/dL

## 2012-11-03 MED ORDER — DIPHENHYDRAMINE HCL 50 MG/ML IJ SOLN
25.0000 mg | Freq: Once | INTRAMUSCULAR | Status: AC
  Administered 2012-11-03: 25 mg via INTRAVENOUS
  Filled 2012-11-03: qty 1

## 2012-11-03 MED ORDER — SODIUM CHLORIDE 0.9 % IV BOLUS (SEPSIS): 1000.0000 mL | Freq: Once | INTRAVENOUS | Status: DC

## 2012-11-03 MED ORDER — METOCLOPRAMIDE HCL 5 MG/ML IJ SOLN
10.0000 mg | Freq: Once | INTRAMUSCULAR | Status: AC
  Administered 2012-11-03: 10 mg via INTRAVENOUS
  Filled 2012-11-03: qty 2

## 2012-11-03 MED ORDER — KETOROLAC TROMETHAMINE 30 MG/ML IJ SOLN
30.0000 mg | Freq: Once | INTRAMUSCULAR | Status: AC
  Administered 2012-11-03: 30 mg via INTRAVENOUS
  Filled 2012-11-03: qty 1

## 2012-11-03 MED ORDER — LORAZEPAM 1 MG PO TABS
1.0000 mg | ORAL_TABLET | Freq: Once | ORAL | Status: AC
  Administered 2012-11-03: 1 mg via ORAL
  Filled 2012-11-03: qty 1

## 2012-11-03 MED ORDER — SODIUM CHLORIDE 0.9 % IV BOLUS (SEPSIS)
1000.0000 mL | Freq: Once | INTRAVENOUS | Status: AC
  Administered 2012-11-03: 1000 mL via INTRAVENOUS

## 2012-11-03 NOTE — ED Provider Notes (Signed)
4:07 PM Patient signed out to me by Magnus Sinning, PA-C. Patient presents with weakness and hypotension. Patient's BP was normal initially and then dropped while she was here.  Patient is currently receiving fluids for hypotension and has a urinalysis and urine pregnancy test pending. If patient's BP responds to fluids and urinalysis and urine preg unremarkable, she can be discharged.   5:20 PM Patient feeling better. BP is now 110/74. Urinalysis and urine pregnancy unremarkable for acute changes. Vitals stable and patient afebrile. Patient will be discharged with instructions to follow up with her PCP for further evaluation. Patient instructed to return with worsening or concerning symptoms.   Emilia Beck, PA-C 11/03/12 1721

## 2012-11-03 NOTE — ED Notes (Signed)
Per ems: Patient reports that she is having a panic attach with chest discomfort. Once here the patient denies any chest pain, reports that she has generalized weakness and has dizziness. The patient reports that she is hot all over as well.

## 2012-11-03 NOTE — ED Notes (Signed)
Phlebotomy at bedside to obtain labs.

## 2012-11-03 NOTE — ED Notes (Signed)
Patient also reports that she feels like she did when she had a low blood pressure during her last hospitalization. Patient has a history as reported by patient of high and low BP as well as High and Low thyroid disease.

## 2012-11-03 NOTE — ED Provider Notes (Signed)
CSN: [id number]     Arrival date & time 11/03/12  1324 History     First MD Initiated Contact with Patient 11/03/12 1402     Chief Complaint  Patient presents with  . Weakness  . Pain   (Consider location/radiation/quality/duration/timing/severity/associated sxs/prior Treatment) HPI Comments: Patient with a history of Anxiety, Fibromyalgia, Hypothyroidism, and Rheumatoid Arthritis presents today with several complaints.  She states that at home her blood pressure was very low at 76/49.  She felt lightheaded and called her PCP who told her to come to the ED.  She is currently on Norvasc and Lisinopril for HTN.  She states that she took both medications at 9 AM this morning.  Norvasc was recently added to her regimen approximately one month ago.  She came into the ED via EMS.  En route she became anxious and began complaining of chest tightness.  She reports that her symptoms felt similar to a panic attack.  She has had panic attacks in the past.  Upon arrival in the ED she states that her chest discomfort had completely resolved.  She currently denies any chest pain or SOB.  She also reports that her dizziness has also resolved at this time.  She is currently complaining of a headache.  She reports that the headache is frontal and does not radiate.  She denies neck pain or stiffness.  Denies fever or chills.  Reports that the headache has been present for a couple of hours.  Headache gradual in onset.  She has not taken anything for the headache prior to arrival.  She is also complaining of bilateral leg pain, but reports that this pain is chronic pain that she has with Fibromyalgia.  The history is provided by the patient.    Past Medical History  Diagnosis Date  . Thyroid disease     Hypothyroidism  . Goiter   . Depression   . Hypertension   . Fibromyalgia    Past Surgical History  Procedure Laterality Date  . Cesarean section  1995, 2001  . Partial hysterectomy  2009  . Abdominal  hysterectomy    . Appendectomy    . Abdominal surgery    . Tonsillectomy  1998   Family History  Problem Relation Age of Onset  . Hypertension Other     Parent   History  Substance Use Topics  . Smoking status: Current Every Day Smoker -- 1.00 packs/day for 10 years  . Smokeless tobacco: Never Used  . Alcohol Use: No   OB History   Grav Para Term Preterm Abortions TAB SAB Ect Mult Living                 Review of Systems  Constitutional: Negative for fever and chills.  Gastrointestinal: Negative for nausea, vomiting and abdominal pain.  All other systems reviewed and are negative.    Allergies  Hct and Lyrica  Home Medications   Current Outpatient Rx  Name  Route  Sig  Dispense  Refill  . amLODipine (NORVASC) 5 MG tablet   Oral   Take 5 mg by mouth daily.         Marland Kitchen amphetamine-dextroamphetamine (ADDERALL XR) 30 MG 24 hr capsule   Oral   Take 30 mg by mouth every morning.         . DULoxetine (CYMBALTA) 30 MG capsule   Oral   Take 60 mg by mouth daily.          Marland Kitchen levothyroxine (SYNTHROID,  LEVOTHROID) 88 MCG tablet   Oral   Take 88 mcg by mouth daily.         Marland Kitchen lisinopril (PRINIVIL,ZESTRIL) 20 MG tablet   Oral   Take 20 mg by mouth daily.         Marland Kitchen oxyCODONE-acetaminophen (PERCOCET/ROXICET) 5-325 MG per tablet   Oral   Take 1-2 tablets by mouth every 6 (six) hours as needed for pain.   15 tablet   0   . traMADol (ULTRAM) 50 MG tablet   Oral   Take 50 mg by mouth every 6 (six) hours as needed for pain.          BP 133/84  Pulse 87  Temp(Src) 97.4 F (36.3 C) (Oral)  Resp 16  Ht 5\' 3"  (1.6 m)  Wt 142 lb (64.411 kg)  BMI 25.16 kg/m2  SpO2 96% Physical Exam  Nursing note and vitals reviewed. Constitutional: She appears well-developed and well-nourished.  HENT:  Head: Normocephalic and atraumatic.  Mouth/Throat: Oropharynx is clear and moist.  Eyes: EOM are normal. Pupils are equal, round, and reactive to light.  Neck: Normal  range of motion. Neck supple.  Cardiovascular: Normal rate, regular rhythm, normal heart sounds and intact distal pulses.   No murmur heard. Pulmonary/Chest: Effort normal and breath sounds normal. No respiratory distress. She has no wheezes. She has no rales.  Abdominal: Soft. She exhibits no distension and no mass. There is no tenderness. There is no rebound and no guarding.  Neurological: She is alert. She has normal strength. No cranial nerve deficit or sensory deficit. Coordination and gait normal.  Skin: Skin is warm and dry. She is not diaphoretic.  Psychiatric: She has a normal mood and affect.    ED Course   Procedures (including critical care time)  Labs Reviewed  CBC WITH DIFFERENTIAL  BASIC METABOLIC PANEL   No results found. No diagnosis found.   Date: 11/03/2012  Rate: 95  Rhythm: normal sinus rhythm  QRS Axis: normal  Intervals: normal  ST/T Wave abnormalities: normal  Conduction Disutrbances:none  Narrative Interpretation:   Old EKG Reviewed: unchanged   3:30 PM Reassessed patient.  She reports that her headache and dizziness have resolved.  Her blood pressure has now dropped.  Initial blood pressure 133/84 has now dropped to 98/69.  Patient discussed with Dr. Loretha Stapler who also evaluated the patient.  4:00 PM Patient signed out to [facility], PA-C at shift change.  UA and Urine pregnancy pending.  Plan is for the patient to get IVF and to be discharged if blood pressure improves.    MDM  Patient presenting with several complaints including headache, dizziness, and bilateral leg pain.  Initial blood pressure was 133/84.  However, blood pressure dropped during ED course.  Patient given IVF.  She reports that Norvasc was added by her PCP one month ago in order to help control her blood pressure.  Plan is to give the patient IVF and if the blood pressure responds will discharge home with recommendation to follow up with PCP.  Patient also described chest  tightness during a panic attack en route to the ED.  She has not had any chest pain or tightness while in the ED.  EKG unremarkable.  Troponin negative.  Patient also with headache.  Normal neurological exam. No red flags of headache.   Patient given migraine cocktail and headache resolved.        Pascal Lux McKenney, PA-C 11/04/12 1116

## 2012-11-04 ENCOUNTER — Emergency Department (HOSPITAL_COMMUNITY)
Admission: EM | Admit: 2012-11-04 | Discharge: 2012-11-04 | Disposition: A | Discharge status: Home / Self Care | Payer: Medicaid Other | Attending: Emergency Medicine | Admitting: Emergency Medicine

## 2012-11-04 ENCOUNTER — Encounter (HOSPITAL_COMMUNITY): Payer: Self-pay | Admitting: Cardiology

## 2012-11-04 DIAGNOSIS — E079 Disorder of thyroid, unspecified: Secondary | ICD-10-CM | POA: Insufficient documentation

## 2012-11-04 DIAGNOSIS — F17200 Nicotine dependence, unspecified, uncomplicated: Secondary | ICD-10-CM | POA: Insufficient documentation

## 2012-11-04 DIAGNOSIS — F329 Major depressive disorder, single episode, unspecified: Secondary | ICD-10-CM | POA: Insufficient documentation

## 2012-11-04 DIAGNOSIS — F3289 Other specified depressive episodes: Secondary | ICD-10-CM | POA: Insufficient documentation

## 2012-11-04 DIAGNOSIS — I10 Essential (primary) hypertension: Secondary | ICD-10-CM | POA: Insufficient documentation

## 2012-11-04 DIAGNOSIS — Z79899 Other long term (current) drug therapy: Secondary | ICD-10-CM | POA: Insufficient documentation

## 2012-11-04 DIAGNOSIS — R0989 Other specified symptoms and signs involving the circulatory and respiratory systems: Secondary | ICD-10-CM

## 2012-11-04 LAB — CG4 I-STAT (LACTIC ACID): Lactic Acid, Venous: 0.99 mmol/L (ref 0.5–2.2)

## 2012-11-04 LAB — CBC WITH DIFFERENTIAL/PLATELET
Basophils Absolute: 0.1 10*3/uL (ref 0.0–0.1)
HCT: 40.8 % (ref 36.0–46.0)
Lymphocytes Relative: 26 % (ref 12–46)
Lymphs Abs: 2.9 10*3/uL (ref 0.7–4.0)
Neutro Abs: 7.2 10*3/uL (ref 1.7–7.7)
Platelets: 354 10*3/uL (ref 150–400)
RBC: 4.4 MIL/uL (ref 3.87–5.11)
RDW: 14.9 % (ref 11.5–15.5)
WBC: 11 10*3/uL — ABNORMAL HIGH (ref 4.0–10.5)

## 2012-11-04 LAB — URINALYSIS, ROUTINE W REFLEX MICROSCOPIC
Bilirubin Urine: NEGATIVE
Hgb urine dipstick: NEGATIVE
Ketones, ur: NEGATIVE mg/dL
Protein, ur: NEGATIVE mg/dL
Specific Gravity, Urine: 1.006 (ref 1.005–1.030)
Urobilinogen, UA: 0.2 mg/dL (ref 0.0–1.0)

## 2012-11-04 LAB — COMPREHENSIVE METABOLIC PANEL
ALT: 16 U/L (ref 0–35)
AST: 20 U/L (ref 0–37)
Alkaline Phosphatase: 73 U/L (ref 39–117)
CO2: 23 mEq/L (ref 19–32)
Chloride: 103 mEq/L (ref 96–112)
GFR calc non Af Amer: >90 mL/min (ref >90)
Potassium: 3.6 mEq/L (ref 3.5–5.1)
Sodium: 137 mEq/L (ref 135–145)
Total Bilirubin: 0.3 mg/dL (ref 0.3–1.2)

## 2012-11-04 MED ORDER — OXYCODONE-ACETAMINOPHEN 5-325 MG PO TABS
2.0000 | ORAL_TABLET | Freq: Once | ORAL | Status: AC
  Administered 2012-11-04: 2 via ORAL
  Filled 2012-11-04: qty 2

## 2012-11-04 MED ORDER — SODIUM CHLORIDE 0.9 % IV BOLUS (SEPSIS)
1000.0000 mL | Freq: Once | INTRAVENOUS | Status: AC
  Administered 2012-11-04: 1000 mL via INTRAVENOUS

## 2012-11-04 NOTE — ED Notes (Signed)
Pt reports that she has noticed her BP has been low for the past couple of days. States that she was sent at PCP and told to come here for further evaluation. Denies any new pain at this time but does report chronic pain in her legs. No other symptoms noted at this time. States that she was in the hospital back in June.

## 2012-11-04 NOTE — ED Notes (Signed)
Pt to ED for evaluation of weakness since her admission to the hospital in June.  Pt states she "hasn't felt right".  Pt was seen at [facility] yesterday for similar symptoms and d/c home- spoke with PCP today and instructed to come back to ED.  Pt alert and oriented at present.  Will continue to monitor pt.

## 2012-11-04 NOTE — ED Notes (Signed)
Results of lactic acid called to Jenna, RN 

## 2012-11-04 NOTE — ED Provider Notes (Signed)
CSN: [id number]     Arrival date & time 11/04/12  1308 History     First MD Initiated Contact with Patient 11/04/12 1117     Chief Complaint  Patient presents with  . Hypotension   HPI  Pt is a 42 yo Caucasian female with pmh of HTN, hypothyroidism on replacement, fibromyalgia, depression, and recent hospitalization 6/9-6/12 and ED visit 7/27  who presents with 1 day history of hypotension. Pt reports 1 day ago she was feeling lightheaded and foggy with a feeing that she was going to pass out. She checked her bp at home yesterday at 58 AM and it was 76/49. She then called her PCP who told her to go the ED. She was seen 1 day ago at [facility] ED with initial BP of 133/84 with later drop to 83/64. She was given IV fluids with resolution of symptoms and released to follow up with her PCP. Her labs indicated mild leukocytosis (13.3) and hyponatremia.  Her PCP called her today and due to still feeling unwell suggested she come to the ED. She reports feeling as though she has the flu and has not felt her normal self since she was hospitalized 6/9-6/12 for SIRS with unknown source of infection. She was worked up for tick borne disease due to h/o tick bite which she was told was negative.   Her bp regimen includes amlodipine 5mg  and lisinopril 20mg . Per PCP she was started 2 weeks ago on amlodipine 5mg  and she reports her home bp is on average 136/100 but fluctuates a lot. She does not report lightheadedness from sitting to standing or with vagal maneuver.  No syncope, palpitations, HA, CP, dyspnea, or  leg swelling.      Past Medical History  Diagnosis Date  . Thyroid disease     Hypothyroidism  . Goiter   . Depression   . Hypertension   . Fibromyalgia    Past Surgical History  Procedure Laterality Date  . Cesarean section  1995, 2001  . Partial hysterectomy  2009  . Abdominal hysterectomy    . Appendectomy    . Abdominal surgery    . Tonsillectomy  1998   Family History  Problem Relation Age  of Onset  . Hypertension Other     Parent   History  Substance Use Topics  . Smoking status: Current Every Day Smoker -- 1.00 packs/day for 10 years  . Smokeless tobacco: Never Used  . Alcohol Use: No   OB History   Grav Para Term Preterm Abortions TAB SAB Ect Mult Living                 Review of Systems  Constitutional: Positive for diaphoresis. Negative for fever and chills.  HENT: Negative for congestion, sore throat, rhinorrhea and neck pain.   Eyes: Negative for visual disturbance.  Respiratory: Negative for cough, chest tightness and shortness of breath.   Cardiovascular: Negative for chest pain, palpitations and leg swelling.  Gastrointestinal: Negative for nausea, vomiting, abdominal pain, diarrhea, constipation and blood in stool.  Genitourinary: Negative for urgency, frequency, hematuria and difficulty urinating.  Musculoskeletal: Positive for back pain (after LP ).       Chronic hip and thigh pain   Neurological: Positive for weakness. Negative for dizziness (1 day ago), syncope and headaches.  Psychiatric/Behavioral: Negative for confusion.    Allergies  Hct and Lyrica  Home Medications   Current Outpatient Rx  Name  Route  Sig  Dispense  Refill  .  amLODipine (NORVASC) 5 MG tablet   Oral   Take 5 mg by mouth daily.         Marland Kitchen amphetamine-dextroamphetamine (ADDERALL XR) 30 MG 24 hr capsule   Oral   Take 30 mg by mouth every morning.         . clonazePAM (KLONOPIN) 1 MG tablet   Oral   Take 1 mg by mouth 2 (two) times daily.         . DULoxetine (CYMBALTA) 60 MG capsule   Oral   Take 60 mg by mouth daily.         Marland Kitchen levothyroxine (SYNTHROID, LEVOTHROID) 88 MCG tablet   Oral   Take 88 mcg by mouth daily.         Marland Kitchen lisinopril (PRINIVIL,ZESTRIL) 20 MG tablet   Oral   Take 20 mg by mouth daily.         Marland Kitchen oxyCODONE-acetaminophen (PERCOCET/ROXICET) 5-325 MG per tablet   Oral   Take 1-2 tablets by mouth every 6 (six) hours as needed for  pain.   15 tablet   0   . traMADol (ULTRAM) 50 MG tablet   Oral   Take 50 mg by mouth every 6 (six) hours as needed for pain.          BP 130/81  Pulse 97  Temp(Src) 98.2 F (36.8 C) (Oral)  Resp 18  SpO2 99% Physical Exam  Constitutional: She is oriented to person, place, and time. She appears well-developed and well-nourished. No distress.  HENT:  Head: Normocephalic and atraumatic.  Right Ear: External ear normal.  Left Ear: External ear normal.  Nose: Nose normal.  Mouth/Throat: Oropharynx is clear and moist. No oropharyngeal exudate.  Eyes: Conjunctivae and EOM are normal. Pupils are equal, round, and reactive to light. Right eye exhibits no discharge. Left eye exhibits no discharge. No scleral icterus.  Neck: Normal range of motion. Neck supple.  Cardiovascular: Normal rate, regular rhythm and normal heart sounds.   Pulmonary/Chest: Effort normal and breath sounds normal. No respiratory distress. She has no wheezes. She has no rales. She exhibits no tenderness.  Abdominal: Soft. Bowel sounds are normal. She exhibits no distension. There is no tenderness. There is no rebound and no guarding.  Small non tender mildly erythematous site of previous tick bite    Musculoskeletal: Normal range of motion. She exhibits tenderness (spiny process tenderness over lumbar region). She exhibits no edema.  Neurological: She is alert and oriented to person, place, and time. No cranial nerve deficit.  No AMS  Normal extremity strength Normal sensation to light touch    Skin: Skin is warm and dry. No rash noted. She is not diaphoretic. No erythema. No pallor.  Psychiatric: She has a normal mood and affect. Her behavior is normal.  Anxious mood     ED Course   Procedures (including critical care time)  Labs Reviewed  CBC WITH DIFFERENTIAL - Abnormal; Notable for the following:    WBC 11.0 (*)    All other components within normal limits  COMPREHENSIVE METABOLIC PANEL - Abnormal;  Notable for the following:    BUN 5 (*)    All other components within normal limits  URINALYSIS, ROUTINE W REFLEX MICROSCOPIC  PROCALCITONIN  CG4 I-STAT (LACTIC ACID)   No results found. 1. Labile blood pressure     MDM  Assessment:  42 yo Caucasian female with pmh of HTN, hypothyroidism on replacement, fibromyalgia, depression, and recent hospitalization 6/9-6/12 and ED visit  7/27  who presents with 1 day history of hypotension.    Plan:  Hypotension - orthostatic hypotension vs hypovolemic hyponatremia  vs  autonomic instability due to tick-borne illness vs cardiogenic shock vs medication induced vs sepsis vs pre-syncope vs thyroid dysfunction vs  POTS    -Obtain orthostatic vital signs ---> appropriate, BP drop to 109/80 after initial reading     -Obtain CBC w/diff  --->  mild leukocytosis --downtrending (1 day ago 13.3) -Obtain CMP ----> WNL (hyponatremia from 1 day ago resolved)  -Obtain procalcitonin levels ---> Sepsis not likely, local infection possible  -Obtain lactic acid levels --->WNL -Administer 1L NS bolus for hypovolemia  -Administer 2 tabs percocet 5-325 for pain   Disposition:  Home --> Pt afebrile and asymptomatic with labile blood pressures, 130/81 on admission to 101/61 two hours later with rest. Orthostatic labs negative. Leukocytosis and hyponatremia improved from 1 day ago. Was given IV fluids for suspected hypovolemia. Pain medication also administered. Etiology of labile blood pressure unknown, most likely autonomic instability due to tick-borne illness considering h/o tick bite, erythema migrans?,  flu-like symptoms, neurological symptoms (h/o encaphalopathy), joint pain (also present at baseline due to fibromyalgia), and cardiac involvement (h/o bradycardia, junctional rhythm), during hospitalization 6 weeks ago for SIRS without evidence of infection. Needed pressor support during hospitalization for hypotension. Also was found to have normocytic anemia and  elevated LFTs.  If tick was attached >24 hrs treatment with doxycycline course of 14-21 days warranted--serology in early Lyme disease is usually negative. Alternative tick-borne illnesses with possible coinfection include babesiosis (hemolytic anemia & thrombocytopenia) and human granulocytic anaplasmosis (leukopenia, thrombocytopenia, elevated LFTS). Also human monocytic ehrlichiosis (leukopenia, thrombocytopenia, elevated LFTs).  Needs to follow-up with ID for further work-up.     Discharge Instructions: -To follow-up with PCP and referral for  ID  -To d/c amlodipineand continue lisinopril 20mg  until PCP visit this week      Otis Brace, MD 11/04/12 2224

## 2012-11-04 NOTE — ED Notes (Signed)
Pt aware urine specimen needed

## 2012-11-05 NOTE — ED Provider Notes (Signed)
Medical screening examination/treatment/procedure(s) were performed by non-physician practitioner and as supervising physician I was immediately available for consultation/collaboration.   Daril Warga M Kamoni Depree, MD 11/05/12 2058 

## 2012-11-06 NOTE — ED Provider Notes (Signed)
Medical screening examination/treatment/procedure(s) were conducted as a shared visit with non-physician practitioner(s) and myself.  I personally evaluated the patient during the encounter  42 yo female with hypotension at home and borderline BPs here.  No fevers or syncope.  On my exam, well appearing, no distress, lungs clear, heart sounds normal with normal rate.  Plan for IV fluids, monitoring in ED while obtaining basic lab workup.    Candyce Churn, MD 11/06/12 [phone]

## 2012-11-06 NOTE — ED Provider Notes (Signed)
I saw and evaluated this patient with the resident physician. In essence this is a young female with fibromyalgia, recent hospitalization, recent initiation of antihypertensive now presenting with concern of hypertension.  Patient was seen yesterday for similar complaints.  Here she is not hypotensive, is awake and alert, and remained so throughout her emergency department stay.  Patient's labs, absence of fever are reassuring for the low suspicion of ongoing infection.  I discussed the patient's case with her primary care nurse practitioner.  Patient is appropriate for discharge with outpatient followup, which we discussed with her care team. The documentation by the resident is appropriate and accurate.  Gerhard Munch, MD 11/06/12 [phone]

## 2012-11-06 NOTE — ED Provider Notes (Signed)
Medical screening examination/treatment/procedure(s) were conducted as a shared visit with non-physician practitioner(s) and myself.  I personally evaluated the patient during the encounter.   Please see my separate note.    Candyce Churn, MD 11/06/12 1254

## 2012-12-18 ENCOUNTER — Other Ambulatory Visit (HOSPITAL_COMMUNITY): Payer: Self-pay | Admitting: *Deleted

## 2012-12-18 DIAGNOSIS — M2550 Pain in unspecified joint: Secondary | ICD-10-CM

## 2012-12-24 ENCOUNTER — Other Ambulatory Visit (HOSPITAL_COMMUNITY): Payer: Self-pay | Admitting: Rheumatology

## 2012-12-24 DIAGNOSIS — M2550 Pain in unspecified joint: Secondary | ICD-10-CM

## 2012-12-26 ENCOUNTER — Ambulatory Visit (HOSPITAL_COMMUNITY)
Admission: RE | Admit: 2012-12-26 | Payer: Medicaid Other | Source: Ambulatory Visit | Attending: *Deleted | Admitting: *Deleted

## 2013-01-02 ENCOUNTER — Ambulatory Visit (HOSPITAL_COMMUNITY): Admission: RE | Admit: 2013-01-02 | Payer: Medicaid Other | Source: Ambulatory Visit

## 2013-01-09 DIAGNOSIS — R898 Other abnormal findings in specimens from other organs, systems and tissues: Secondary | ICD-10-CM | POA: Insufficient documentation

## 2013-02-13 ENCOUNTER — Other Ambulatory Visit: Payer: Self-pay

## 2013-03-18 ENCOUNTER — Ambulatory Visit (INDEPENDENT_AMBULATORY_CARE_PROVIDER_SITE_OTHER): Payer: Medicaid Other | Admitting: Diagnostic Neuroimaging

## 2013-03-18 ENCOUNTER — Encounter: Payer: Self-pay | Admitting: Diagnostic Neuroimaging

## 2013-03-18 VITALS — BP 130/90 | HR 96 | Temp 97.2°F | Ht 60.0 in | Wt 152.0 lb

## 2013-03-18 DIAGNOSIS — M797 Fibromyalgia: Secondary | ICD-10-CM

## 2013-03-18 DIAGNOSIS — F329 Major depressive disorder, single episode, unspecified: Secondary | ICD-10-CM

## 2013-03-18 DIAGNOSIS — IMO0001 Reserved for inherently not codable concepts without codable children: Secondary | ICD-10-CM

## 2013-03-18 DIAGNOSIS — F988 Other specified behavioral and emotional disorders with onset usually occurring in childhood and adolescence: Secondary | ICD-10-CM

## 2013-03-18 DIAGNOSIS — R413 Other amnesia: Secondary | ICD-10-CM

## 2013-03-18 NOTE — Progress Notes (Signed)
[facility]  PATIENT: Sharon Atkins [facility] DOB: 1970-04-18  REFERRING CLINICIAN: Karleen Hampshire HISTORY FROM: patient  REASON FOR VISIT: new consult   HISTORICAL  CHIEF COMPLAINT:  Chief Complaint  Patient presents with  . Memory Loss    HISTORY OF PRESENT ILLNESS:   42 year old female here for evaluation of memory loss.  Patient reports history of confusion and short-term memory problems since December 2013. Symptoms are fairly primarily. She has difficulty with remembering conversations and events.  Patient also struggled with depression, currently on Cymbalta transitioning to [place]. Overall she feels like her depression is stable, but something she is struggled with lifelong.  Patient also diagnosed with attention deficit disorder, now on Adderall.  Patient also has chronic pain, fibromyalgia, and takes Percocet and tramadol intermittently. Also on meloxicam.  Patient also with unexplained leukocytosis, and has been evaluated by infectious disease and oncology without specific diagnosis.  Patient was hospitalized in June 2014 for confusion, altered mental status, hypotension. Apparently patient had a tick bite in May 2014. Then had a fall, neck pain weakness, leading to hospitalization. Patient was managed by critical care, with pressor support. CSF testing was unremarkable. MRI of the cervical spine to soft tissue swelling but no spinal cord lesions.   REVIEW OF SYSTEMS: Full 14 system review of systems performed and notable only for memory loss confusion insomnia depression anxiety joint pain and sore aching muscles fatigue.  ALLERGIES: Allergies  Allergen Reactions  . Hct [Hydrochlorothiazide] Other (See Comments)    "affects potassium more than normal"  . Lyrica [Pregabalin] Other (See Comments)    "makes her feel stupid"    HOME MEDICATIONS: Outpatient Prescriptions Prior to Visit  Medication Sig Dispense Refill  . amphetamine-dextroamphetamine  (ADDERALL XR) 30 MG 24 hr capsule Take 30 mg by mouth every morning.      . DULoxetine (CYMBALTA) 60 MG capsule Take 60 mg by mouth daily.      Marland Kitchen levothyroxine (SYNTHROID, LEVOTHROID) 88 MCG tablet Take 88 mcg by mouth daily.      Marland Kitchen lisinopril (PRINIVIL,ZESTRIL) 20 MG tablet Take 20 mg by mouth daily.      Marland Kitchen oxyCODONE-acetaminophen (PERCOCET/ROXICET) 5-325 MG per tablet Take 1-2 tablets by mouth every 6 (six) hours as needed for pain.  15 tablet  0  . traMADol (ULTRAM) 50 MG tablet Take 50 mg by mouth every 6 (six) hours as needed for pain.      Marland Kitchen amLODipine (NORVASC) 5 MG tablet Take 5 mg by mouth daily.      . clonazePAM (KLONOPIN) 1 MG tablet Take 1 mg by mouth 2 (two) times daily.       No facility-administered medications prior to visit.    PAST MEDICAL HISTORY: Past Medical History  Diagnosis Date  . Thyroid disease     Hypothyroidism  . Goiter   . Depression   . Hypertension   . Fibromyalgia   . Arthritis   . Elevated WBC count   . Anxiety     PAST SURGICAL HISTORY: Past Surgical History  Procedure Laterality Date  . Cesarean section  1995, 2001  . Partial hysterectomy  2009  . Abdominal hysterectomy    . Appendectomy    . Abdominal surgery    . Tonsillectomy  1998    FAMILY HISTORY: Family History  Problem Relation Age of Onset  . Hypertension Other     Parent    SOCIAL HISTORY:  History   Social History  . Marital Status: Divorced  Spouse Name: N/A    Number of Children: 2  . Years of Education: 16   Occupational History  . Unemployed    Social History Main Topics  . Smoking status: Current Every Day Smoker -- 1.00 packs/day for 10 years  . Smokeless tobacco: Never Used  . Alcohol Use: No  . Drug Use: No  . Sexual Activity: Yes   Other Topics Concern  . Not on file   Social History Narrative   Regular exercise-yes   Caffeine Use-yes, 3-16oz daily   Patient lives at home with son.           PHYSICAL EXAM  Filed Vitals:    03/18/13 1138  BP: 130/90  Pulse: 96  Temp: 97.2 F (36.2 C)  TempSrc: Oral  Height: 5' (1.524 m)  Weight: 152 lb (68.947 kg)    Not recorded    Body mass index is 29.69 kg/(m^2).  GENERAL EXAM: Patient is in no distress; well developed, nourished and groomed; neck is supple; EDENTULOUS (NO UPPER TEETH).   CARDIOVASCULAR: Regular rate and rhythm, no murmurs, no carotid bruits  NEUROLOGIC: MENTAL STATUS: awake, alert, oriented to person, place and time, recent and remote memory intact, normal attention and concentration, language fluent, comprehension intact, naming intact, fund of knowledge appropriate' MMSE 29/30 (MISSES 1 FOR SEASON --> "WINTER"). MOCA 25/30 (MISSES 1 FOR CUBE, 1 FOR CLOCK, 2 FOR LANGUAGE AND 1 FOR FLUENCY). NO FRONTAL RELEASE SIGNS. CRANIAL NERVE: no papilledema on fundoscopic exam, pupils equal and reactive to light, visual fields full to confrontation, extraocular muscles intact, no nystagmus, facial sensation and strength symmetric, hearing intact, palate elevates symmetrically, uvula midline, shoulder shrug symmetric, tongue midline. MOTOR: normal bulk and tone, full strength in the BUE, BLE SENSORY: normal and symmetric to light touch, temperature, vibration and proprioception COORDINATION: finger-nose-finger, fine finger movements normal REFLEXES: deep tendon reflexes present and symmetric GAIT/STATION: narrow based gait; able to walk on toes, heels and tandem; romberg is negative    DIAGNOSTIC DATA (LABS, IMAGING, TESTING) - I reviewed patient records, labs, notes, testing and imaging myself where available.  Lab Results  Component Value Date   WBC 11.0* 11/04/2012   HGB 14.7 11/04/2012   HCT 40.8 11/04/2012   MCV 92.7 11/04/2012   PLT 354 11/04/2012      Component Value Date/Time   NA 137 11/04/2012 1210   K 3.6 11/04/2012 1210   CL 103 11/04/2012 1210   CO2 23 11/04/2012 1210   GLUCOSE 71 11/04/2012 1210   BUN 5* 11/04/2012 1210   CREATININE 0.62  11/04/2012 1210   CALCIUM 9.4 11/04/2012 1210   PROT 7.2 11/04/2012 1210   ALBUMIN 3.8 11/04/2012 1210   AST 20 11/04/2012 1210   ALT 16 11/04/2012 1210   ALKPHOS 73 11/04/2012 1210   BILITOT 0.3 11/04/2012 1210   GFRNONAA >90 11/04/2012 1210   GFRAA >90 11/04/2012 1210   No results found for this basename: CHOL, HDL, LDLCALC, LDLDIRECT, TRIG, CHOLHDL   No results found for this basename: HGBA1C   No results found for this basename: VITAMINB12   Lab Results  Component Value Date   TSH 0.370 09/16/2012    I reviewed images myself and agree with interpretation.   09/17/12 MRI brain - No acute abnormality  09/17/12 MRI cervical spine - edema within paraspinal soft tissue extending from the occiput to C7 level suggestive of result of soft tissue injury from recent fall. Minimal to mild cervical spondylotic changes most notable C4-5 as  detailed above. Critical Value/emergent results were called by telephone at the time of interpretation on 09/17/2012 at 10:50 p.m. to [facility] the patient's nurse, who verbally acknowledged these results.  June 2014: Labs - RMSF negative, Lyme negative, EtOH < 11, CSF (WBC 1, RBC 196, protein 52, glucose 80, gram stain negative)  ASSESSMENT AND PLAN  42 y.o. year old female here with gradual memory loss over past one year. Later concomitant depression, attention deficit disorder and chronic pain. No evidence of underlying neurodegenerative condition.  PLAN: - Recommend observation for now.  Return in about 6 months (around 09/16/2013).    Suanne Marker, MD 03/18/2013, 12:43 PM Certified in Neurology, Neurophysiology and Neuroimaging  [facility] [address], [address] [place], [place] [address] [phone]

## 2013-03-18 NOTE — Patient Instructions (Signed)
No further testing advised. Follow up with your PCP.

## 2013-05-17 ENCOUNTER — Emergency Department (HOSPITAL_COMMUNITY): Payer: Medicaid Other

## 2013-05-17 ENCOUNTER — Encounter (HOSPITAL_COMMUNITY): Payer: Self-pay | Admitting: Emergency Medicine

## 2013-05-17 ENCOUNTER — Inpatient Hospital Stay (HOSPITAL_COMMUNITY)
Admission: EM | Admit: 2013-05-17 | Discharge: 2013-05-19 | DRG: 092 | Disposition: A | Discharge status: Home / Self Care | Payer: Medicaid Other | Attending: Internal Medicine | Admitting: Internal Medicine

## 2013-05-17 DIAGNOSIS — R7401 Elevation of levels of liver transaminase levels: Secondary | ICD-10-CM

## 2013-05-17 DIAGNOSIS — F329 Major depressive disorder, single episode, unspecified: Secondary | ICD-10-CM | POA: Diagnosis present

## 2013-05-17 DIAGNOSIS — T424X5A Adverse effect of benzodiazepines, initial encounter: Secondary | ICD-10-CM | POA: Diagnosis present

## 2013-05-17 DIAGNOSIS — T40605A Adverse effect of unspecified narcotics, initial encounter: Secondary | ICD-10-CM | POA: Diagnosis present

## 2013-05-17 DIAGNOSIS — M797 Fibromyalgia: Secondary | ICD-10-CM

## 2013-05-17 DIAGNOSIS — T481X5A Adverse effect of skeletal muscle relaxants [neuromuscular blocking agents], initial encounter: Secondary | ICD-10-CM | POA: Diagnosis present

## 2013-05-17 DIAGNOSIS — Z720 Tobacco use: Secondary | ICD-10-CM

## 2013-05-17 DIAGNOSIS — N179 Acute kidney failure, unspecified: Secondary | ICD-10-CM

## 2013-05-17 DIAGNOSIS — F411 Generalized anxiety disorder: Secondary | ICD-10-CM | POA: Diagnosis present

## 2013-05-17 DIAGNOSIS — N289 Disorder of kidney and ureter, unspecified: Secondary | ICD-10-CM

## 2013-05-17 DIAGNOSIS — E876 Hypokalemia: Secondary | ICD-10-CM

## 2013-05-17 DIAGNOSIS — G253 Myoclonus: Secondary | ICD-10-CM

## 2013-05-17 DIAGNOSIS — R740 Nonspecific elevation of levels of transaminase and lactic acid dehydrogenase [LDH]: Secondary | ICD-10-CM

## 2013-05-17 DIAGNOSIS — IMO0001 Reserved for inherently not codable concepts without codable children: Secondary | ICD-10-CM

## 2013-05-17 DIAGNOSIS — M129 Arthropathy, unspecified: Secondary | ICD-10-CM | POA: Diagnosis present

## 2013-05-17 DIAGNOSIS — F988 Other specified behavioral and emotional disorders with onset usually occurring in childhood and adolescence: Secondary | ICD-10-CM

## 2013-05-17 DIAGNOSIS — F3289 Other specified depressive episodes: Secondary | ICD-10-CM | POA: Diagnosis present

## 2013-05-17 DIAGNOSIS — M25561 Pain in right knee: Secondary | ICD-10-CM

## 2013-05-17 DIAGNOSIS — R55 Syncope and collapse: Secondary | ICD-10-CM

## 2013-05-17 DIAGNOSIS — T4275XA Adverse effect of unspecified antiepileptic and sedative-hypnotic drugs, initial encounter: Secondary | ICD-10-CM | POA: Diagnosis present

## 2013-05-17 DIAGNOSIS — F32A Depression, unspecified: Secondary | ICD-10-CM

## 2013-05-17 DIAGNOSIS — G4700 Insomnia, unspecified: Secondary | ICD-10-CM | POA: Diagnosis present

## 2013-05-17 DIAGNOSIS — G929 Unspecified toxic encephalopathy: Principal | ICD-10-CM | POA: Diagnosis present

## 2013-05-17 DIAGNOSIS — Z789 Other specified health status: Secondary | ICD-10-CM

## 2013-05-17 DIAGNOSIS — I959 Hypotension, unspecified: Secondary | ICD-10-CM

## 2013-05-17 DIAGNOSIS — I10 Essential (primary) hypertension: Secondary | ICD-10-CM

## 2013-05-17 DIAGNOSIS — A419 Sepsis, unspecified organism: Secondary | ICD-10-CM

## 2013-05-17 DIAGNOSIS — R001 Bradycardia, unspecified: Secondary | ICD-10-CM

## 2013-05-17 DIAGNOSIS — E861 Hypovolemia: Secondary | ICD-10-CM | POA: Diagnosis present

## 2013-05-17 DIAGNOSIS — E871 Hypo-osmolality and hyponatremia: Secondary | ICD-10-CM

## 2013-05-17 DIAGNOSIS — R6510 Systemic inflammatory response syndrome (SIRS) of non-infectious origin without acute organ dysfunction: Secondary | ICD-10-CM

## 2013-05-17 DIAGNOSIS — T43605A Adverse effect of unspecified psychostimulants, initial encounter: Secondary | ICD-10-CM | POA: Diagnosis present

## 2013-05-17 DIAGNOSIS — Z79899 Other long term (current) drug therapy: Secondary | ICD-10-CM

## 2013-05-17 DIAGNOSIS — R7402 Elevation of levels of lactic acid dehydrogenase (LDH): Secondary | ICD-10-CM | POA: Diagnosis present

## 2013-05-17 DIAGNOSIS — G92 Toxic encephalopathy: Principal | ICD-10-CM | POA: Diagnosis present

## 2013-05-17 DIAGNOSIS — G8929 Other chronic pain: Secondary | ICD-10-CM | POA: Diagnosis present

## 2013-05-17 DIAGNOSIS — R4182 Altered mental status, unspecified: Secondary | ICD-10-CM

## 2013-05-17 DIAGNOSIS — T43205A Adverse effect of unspecified antidepressants, initial encounter: Secondary | ICD-10-CM | POA: Diagnosis present

## 2013-05-17 DIAGNOSIS — G9340 Encephalopathy, unspecified: Secondary | ICD-10-CM

## 2013-05-17 DIAGNOSIS — F17200 Nicotine dependence, unspecified, uncomplicated: Secondary | ICD-10-CM | POA: Diagnosis present

## 2013-05-17 DIAGNOSIS — T426X5A Adverse effect of other antiepileptic and sedative-hypnotic drugs, initial encounter: Secondary | ICD-10-CM | POA: Diagnosis present

## 2013-05-17 DIAGNOSIS — E039 Hypothyroidism, unspecified: Secondary | ICD-10-CM

## 2013-05-17 DIAGNOSIS — M6282 Rhabdomyolysis: Secondary | ICD-10-CM

## 2013-05-17 HISTORY — DX: Other specified behavioral and emotional disorders with onset usually occurring in childhood and adolescence: F98.8

## 2013-05-17 HISTORY — DX: Other chronic pain: G89.29

## 2013-05-17 HISTORY — DX: Other amnesia: R41.3

## 2013-05-17 LAB — CBC WITH DIFFERENTIAL/PLATELET
BASOS ABS: 0 10*3/uL (ref 0.0–0.1)
Basophils Relative: 0 % (ref 0–1)
EOS ABS: 0.7 10*3/uL (ref 0.0–0.7)
EOS PCT: 5 % (ref 0–5)
HCT: 38 % (ref 36.0–46.0)
Hemoglobin: 13.2 g/dL (ref 12.0–15.0)
LYMPHS ABS: 2.7 10*3/uL (ref 0.7–4.0)
LYMPHS PCT: 21 % (ref 12–46)
MCH: 32 pg (ref 26.0–34.0)
MCHC: 34.7 g/dL (ref 30.0–36.0)
MCV: 92 fL (ref 78.0–100.0)
Monocytes Absolute: 0.7 10*3/uL (ref 0.1–1.0)
Monocytes Relative: 6 % (ref 3–12)
NEUTROS PCT: 68 % (ref 43–77)
Neutro Abs: 9 10*3/uL — ABNORMAL HIGH (ref 1.7–7.7)
PLATELETS: 346 10*3/uL (ref 150–400)
RBC: 4.13 MIL/uL (ref 3.87–5.11)
RDW: 14 % (ref 11.5–15.5)
WBC: 13.1 10*3/uL — AB (ref 4.0–10.5)

## 2013-05-17 LAB — SALICYLATE LEVEL: Salicylate Lvl: <2 mg/dL — ABNORMAL LOW (ref 2.8–20.0)

## 2013-05-17 LAB — URINALYSIS, ROUTINE W REFLEX MICROSCOPIC
BILIRUBIN URINE: NEGATIVE
GLUCOSE, UA: NEGATIVE mg/dL
KETONES UR: NEGATIVE mg/dL
Leukocytes, UA: NEGATIVE
Nitrite: NEGATIVE
PH: 6.5 (ref 5.0–8.0)
Protein, ur: NEGATIVE mg/dL
Urobilinogen, UA: 0.2 mg/dL (ref 0.0–1.0)

## 2013-05-17 LAB — RAPID URINE DRUG SCREEN, HOSP PERFORMED
AMPHETAMINES: POSITIVE — AB
BARBITURATES: NOT DETECTED
BENZODIAZEPINES: NOT DETECTED
COCAINE: NOT DETECTED
Opiates: POSITIVE — AB
TETRAHYDROCANNABINOL: NOT DETECTED

## 2013-05-17 LAB — COMPREHENSIVE METABOLIC PANEL
ALK PHOS: 86 U/L (ref 39–117)
ALT: 51 U/L — AB (ref 0–35)
AST: 51 U/L — AB (ref 0–37)
Albumin: 3.5 g/dL (ref 3.5–5.2)
BUN: 18 mg/dL (ref 6–23)
CALCIUM: 10.2 mg/dL (ref 8.4–10.5)
CO2: 25 mEq/L (ref 19–32)
Chloride: 97 mEq/L (ref 96–112)
Creatinine, Ser: 1.58 mg/dL — ABNORMAL HIGH (ref 0.50–1.10)
GFR calc non Af Amer: 39 mL/min — ABNORMAL LOW (ref >90)
GFR, EST AFRICAN AMERICAN: 45 mL/min — AB (ref >90)
GLUCOSE: 109 mg/dL — AB (ref 70–99)
POTASSIUM: 3 meq/L — AB (ref 3.7–5.3)
SODIUM: 134 meq/L — AB (ref 137–147)
TOTAL PROTEIN: 6.9 g/dL (ref 6.0–8.3)
Total Bilirubin: 0.3 mg/dL (ref 0.3–1.2)

## 2013-05-17 LAB — ETHANOL

## 2013-05-17 LAB — URINE MICROSCOPIC-ADD ON

## 2013-05-17 LAB — PREGNANCY, URINE: Preg Test, Ur: NEGATIVE

## 2013-05-17 LAB — ACETAMINOPHEN LEVEL: Acetaminophen (Tylenol), Serum: <15 ug/mL (ref 10–30)

## 2013-05-17 LAB — CK: Total CK: 3409 U/L — ABNORMAL HIGH (ref 7–177)

## 2013-05-17 LAB — TROPONIN I

## 2013-05-17 LAB — MAGNESIUM: Magnesium: 2 mg/dL (ref 1.5–2.5)

## 2013-05-17 MED ORDER — ONDANSETRON HCL 4 MG PO TABS: 4.0000 mg | ORAL_TABLET | Freq: Four times a day (QID) | ORAL | Status: DC | PRN

## 2013-05-17 MED ORDER — ONDANSETRON HCL 4 MG/2ML IJ SOLN: 4.0000 mg | Freq: Four times a day (QID) | INTRAMUSCULAR | Status: DC | PRN

## 2013-05-17 MED ORDER — HALOPERIDOL LACTATE 5 MG/ML IJ SOLN
5.0000 mg | Freq: Once | INTRAMUSCULAR | Status: AC
  Administered 2013-05-17: 5 mg via INTRAVENOUS
  Filled 2013-05-17: qty 1

## 2013-05-17 MED ORDER — LORAZEPAM 2 MG/ML IJ SOLN
0.5000 mg | Freq: Once | INTRAMUSCULAR | Status: AC
  Administered 2013-05-17: 0.5 mg via INTRAVENOUS
  Filled 2013-05-17: qty 1

## 2013-05-17 MED ORDER — POTASSIUM CHLORIDE 10 MEQ/100ML IV SOLN
10.0000 meq | Freq: Once | INTRAVENOUS | Status: AC
  Administered 2013-05-17: 10 meq via INTRAVENOUS
  Filled 2013-05-17: qty 100

## 2013-05-17 MED ORDER — SODIUM CHLORIDE 0.9 % IV SOLN
INTRAVENOUS | Status: DC
  Administered 2013-05-17: 10:00:00 via INTRAVENOUS

## 2013-05-17 MED ORDER — LEVOTHYROXINE SODIUM 100 MCG PO TABS
100.0000 ug | ORAL_TABLET | Freq: Every day | ORAL | Status: DC
  Administered 2013-05-18 – 2013-05-19 (×2): 100 ug via ORAL
  Filled 2013-05-17 (×2): qty 1

## 2013-05-17 MED ORDER — ALUM & MAG HYDROXIDE-SIMETH 200-200-20 MG/5ML PO SUSP: 30.0000 mL | Freq: Four times a day (QID) | ORAL | Status: DC | PRN

## 2013-05-17 MED ORDER — SODIUM CHLORIDE 0.9 % IV BOLUS (SEPSIS)
500.0000 mL | Freq: Once | INTRAVENOUS | Status: AC
  Administered 2013-05-17: 12:00:00 via INTRAVENOUS

## 2013-05-17 MED ORDER — NICOTINE 7 MG/24HR TD PT24
7.0000 mg | MEDICATED_PATCH | Freq: Every day | TRANSDERMAL | Status: DC
  Administered 2013-05-18 – 2013-05-19 (×2): 7 mg via TRANSDERMAL
  Filled 2013-05-17 (×5): qty 1

## 2013-05-17 MED ORDER — POTASSIUM CHLORIDE IN NACL 40-0.9 MEQ/L-% IV SOLN
INTRAVENOUS | Status: DC
  Administered 2013-05-17 – 2013-05-18 (×3): via INTRAVENOUS

## 2013-05-17 MED ORDER — ACETAMINOPHEN 325 MG PO TABS
650.0000 mg | ORAL_TABLET | Freq: Four times a day (QID) | ORAL | Status: DC | PRN
  Administered 2013-05-19: 650 mg via ORAL
  Filled 2013-05-17: qty 2

## 2013-05-17 MED ORDER — ACETAMINOPHEN 650 MG RE SUPP: 650.0000 mg | Freq: Four times a day (QID) | RECTAL | Status: DC | PRN

## 2013-05-17 NOTE — ED Notes (Signed)
Patient with c/o altered mental status. Last normal last night around 2000. Patient's son states mother woke him up last night at 2200 and told him it was time to get up and go to school. Patient oriented to person, disoriented to time (thinks its Nov 2015), follows commands. Grips equal, pupils PERRL. States chronic back pain, denies acute pain at present.

## 2013-05-17 NOTE — H&P (Signed)
[facility] History and Physical  Sharon Atkins [facility] [id number] DOB: Oct 06, 1970 DOA: 05/17/2013  Referring physician: ED physician, Dr. Thurnell Garbe PCP: Aura Dials, PA-C   Chief Complaint: Altered mental status, found down at home.  HPI: Sharon Atkins is a 43 y.o. female with a history of fibromyalgia, depression, and ADD. She also has a history of SIRS/sepsis, acute encephalopathy, an acute renal injury in June 2014. During that hospitalization, her urine drug screen was positive for amphetamines and benzodiazepines. The workup was essentially negative for an infectious source of her sepsis/hypotension. Today she presents with altered mental status. She is lethargic and intermittently interactive. The history is being provided by the patient's mother and the ED notes and staff. Accordingly, the patient began to have altered mental status last night. Her son reported that she had awakened him in the middle of the night and told him "it was time for school". He went back to sleep, but then woke up at approximately 6:00 AM this morning to find his mother on the floor. He was able to "wake her up by talking to her". Per the history provided, the patient was recently started on Lyrica, Flexeril, and oxycodone for pain by her primary care provider. Apparently, she had been off of Lyrica for 6 weeks, but it was restarted yesterday. Her mother reports that the patient has a history of oxycodone addiction requiring treatment.  Currently, the patient is lethargic and is able to answer some questions, but without reliability according to her mother. In the ED, the patient's blood pressure ranged from 89/75-108/72. She is oxygenating 98% on supplement oxygen. CT scan of her head reveals no acute intracranial findings. Her chest x-ray reveals no acute cardiopulmonary disease. Her EKG reveals normal sinus rhythm with poor R-wave progression at 92 beats per minute. Her urine drug screen is positive for  amphetamines and opiates. Her lab data are significant for a serum sodium of 134, potassium of 3.0, WBC of 13.1, and creatinine 1.58. She is being admitted for further evaluation and management.    Review of Systems:  Unable to obtain reliable history secondary to the patient's encephalopathy. But she does endorse recent low back pain and history of chronic pain from fibromyalgia.  Past Medical History  Diagnosis Date  . Thyroid disease     Hypothyroidism  . Goiter   . Depression   . Hypertension   . Fibromyalgia   . Arthritis   . Elevated WBC count     unknown cause  . Anxiety   . Memory loss since 03/2012  . Chronic pain   . ADD (attention deficit disorder)   . Tobacco abuse    Past Surgical History  Procedure Laterality Date  . Cesarean section  1995, 2001  . Partial hysterectomy  2009  . Abdominal hysterectomy    . Appendectomy    . Abdominal surgery    . Tonsillectomy  1998   Social History: The patient is divorced. She lives with one of her children. She has 2 children all. She is unemployed and seeking disability. She smokes approximately half a pack of cigarettes per day. She denies alcohol and illicit drug use. Her mother reports a history of oxycodone addiction for which she received treatment.   Allergies  Allergen Reactions  . Hct [Hydrochlorothiazide] Other (See Comments)    "affects potassium more than normal"  . Lyrica [Pregabalin] Other (See Comments)    "makes her feel stupid"    Family History  Problem Relation Age of  Onset  . Hypertension Other     Parent   Family history: Her mother is alive and healthy. Her father has a history of heart disease and hypertension.  Prior to Admission medications   Medication Sig Start Date End Date Taking? Authorizing Provider  amphetamine-dextroamphetamine (ADDERALL XR) 20 MG 24 hr capsule Take 20 mg by mouth daily.   Yes Historical Provider, MD  clonazePAM (KLONOPIN) 0.5 MG tablet Take 0.5 mg by mouth 2 (two)  times daily.   Yes Historical Provider, MD  cyclobenzaprine (FLEXERIL) 10 MG tablet Take 10 mg by mouth at bedtime.   Yes Historical Provider, MD  DULoxetine (CYMBALTA) 60 MG capsule Take 60 mg by mouth daily.   Yes Historical Provider, MD  levothyroxine (SYNTHROID, LEVOTHROID) 100 MCG tablet Take 100 mcg by mouth daily before breakfast.   Yes Historical Provider, MD  lisinopril (PRINIVIL,ZESTRIL) 20 MG tablet Take 20 mg by mouth daily.   Yes Historical Provider, MD  pregabalin (LYRICA) 300 MG capsule Take 300 mg by mouth daily.   Yes Historical Provider, MD  traMADol (ULTRAM) 50 MG tablet Take 50 mg by mouth 2 (two) times daily.    Yes Historical Provider, MD  zolpidem (AMBIEN) 10 MG tablet Take 10 mg by mouth at bedtime.    Yes Historical Provider, MD   Physical Exam: Filed Vitals:   05/17/13 1329  BP: 96/70  Pulse:   Resp: 20    BP 96/70  Pulse 78  Resp 20  SpO2 98%  General: 43 year old Caucasian woman who is asleep and difficult to arouse, but eventually became arousable. No acute distress. Eyes: PERRL, normal lids, irises & conjunctiva ENT: grossly normal hearing, lips & tongue Neck: no LAD, masses or thyromegaly Cardiovascular: RRR, no m/r/g. No LE edema. Telemetry: SR, no arrhythmias  Respiratory: CTA bilaterally, no w/r/r. Normal respiratory effort. Abdomen: soft, ntnd, mildly obese, positive bowel sounds. Skin: no rash or induration seen on limited exam Musculoskeletal: grossly normal tone BUE/BLE. Noted muscle jerks occasionally while sleeping. Psychiatric: She is lethargic. She awakens but then falls back to sleep. She mumbles and speaks incoherently. The patient clearly stated "no " when I asked if she was trying to hurt herself with taking too many medications. Neurologic: She is lethargic and encephalopathic. She speaks incoherently most of the time, but occasionally her speech is clear. She is oriented to herself" the hospital". She is not oriented to the city, the  name of the hospital, and several questions which her mother stated that the answers were incorrect. No obvious facial droop. No obvious cranial nerve deficits with the exception of her speech which is garbled and slurred. She has occasional muscle jerks in her arms and her legs, but no sustained tonic-clonic activity.           Labs on Admission:  Basic Metabolic Panel:  Recent Labs Lab 05/17/13 0919  NA 134*  K 3.0*  CL 97  CO2 25  GLUCOSE 109*  BUN 18  CREATININE 1.58*  CALCIUM 10.2   Liver Function Tests:  Recent Labs Lab 05/17/13 0919  AST 51*  ALT 51*  ALKPHOS 86  BILITOT 0.3  PROT 6.9  ALBUMIN 3.5   No results found for this basename: LIPASE, AMYLASE,  in the last 168 hours No results found for this basename: AMMONIA,  in the last 168 hours CBC:  Recent Labs Lab 05/17/13 0919  WBC 13.1*  NEUTROABS 9.0*  HGB 13.2  HCT 38.0  MCV 92.0  PLT 346  Cardiac Enzymes: No results found for this basename: CKTOTAL, CKMB, CKMBINDEX, TROPONINI,  in the last 168 hours  BNP (last 3 results) No results found for this basename: PROBNP,  in the last 8760 hours CBG: No results found for this basename: GLUCAP,  in the last 168 hours  Radiological Exams on Admission: Dg Chest 2 View  05/17/2013   CLINICAL DATA:  Altered mental status, difficulty breathing  EXAM: CHEST  2 VIEW  COMPARISON:  11/20/2011  FINDINGS: The heart size and mediastinal contours are within normal limits. Both lungs are clear. The visualized skeletal structures are unremarkable.  IMPRESSION: No active cardiopulmonary disease.   Electronically Signed   By: Inez Catalina M.D.   On: 05/17/2013 10:55   Ct Head Wo Contrast  05/17/2013   CLINICAL DATA:  Altered mental status and twitching  EXAM: CT HEAD WITHOUT CONTRAST  TECHNIQUE: Contiguous axial images were obtained from the base of the skull through the vertex without intravenous contrast.  COMPARISON:  CT HEAD W/O CM dated 09/16/2012; MR HEAD W/O CM dated  09/17/2012  FINDINGS: Sinuses/Soft tissues: Clear paranasal sinuses and mastoid air cells.  Intracranial: Motion degradation, despite 2 attempts. This is mild to moderate. Given the extent of motion degradation, no mass lesion, hemorrhage, hydrocephalus, acute infarct, intra-axial, or extra-axial fluid collection.  IMPRESSION: 1. Mild to moderately motion degraded exam. 2. Given this factor, no acute intracranial abnormality.   Electronically Signed   By: Abigail Miyamoto M.D.   On: 05/17/2013 10:44    EKG: Independently reviewed. As above.  Assessment/Plan Principal Problem:   Acute encephalopathy Active Problems:   Drug-drug interaction   Muscle jerks during sleep   Fibromyalgia   AKI (acute kidney injury)   ADD (attention deficit disorder)   Hypokalemia   Hyponatremia   Transaminitis   Tobacco abuse   1. This is a 43 year old woman who presents with acute encephalopathy after being found down at home by her son this morning. She had a similar episode in June of last year with hypotension, encephalopathy, and a urine drug screen positive for benzodiazepines and amphetamines. She is treated with Klonopin and Adderall for chronic anxiety and ADD respectively. She is also treated with Cymbalta for depression and Ambien for insomnia. Apparently, she was recently restarted on Lyrica. She was also started on oxycodone and Flexeril for pain. Lyrica had been discontinued approximately 6 weeks ago before it was restarted, but she could not tell me why. Listed also is tramadol as one of her home medications. With all of these medications on board, it is likely that she has a toxic effects from drug drug interactions. She is protecting her airway. She appears to be volume depleted given her mild hyponatremia and acute renal failure. There appears to be no evidence of an infectious source of her symptomatology. The patient also denies suicidal ideation. Her mother does not endorse severe depression which would  cause her to intentionally overdose. Will monitor her closely in the step down unit.   Plan: 1. Will start IV fluid hydration with normal saline with 40 mEq of potassium chloride added. 2. We'll hold all psychotropic and opiate analgesic medications. 3. Neuro checks every 4 hours. 4. We'll place a nicotine patch. Tobacco cessation counseling when she is more alert. 5. For further evaluation, we'll order an EEG, TSH, total CK to rule out rhabdomyolysis, troponin I.    Code Status: Full code Family Communication: Discuss with her mother. Disposition Plan: To be determined.  Time spent:  One hour and 15 minutes.  [facility] Pager [phone]

## 2013-05-17 NOTE — ED Provider Notes (Signed)
CSN: [id number]     Arrival date & time 05/17/13  [id number] History   First MD Initiated Contact with Patient 05/17/13 [phone]     Chief Complaint  Patient presents with  . Altered Mental Status    HPI Pt was seen at 0955. Per pt, her mother and her son, c/o gradual onset and persistence of constant AMS that began approximately 2200 last night. Pt's son states pt woke him up from sleep and told him "it was time for school." Pt's son states he went back to sleep and when he woke up approximately 0600 he found his mother on the floor. States he was able to "wake her up by talking to her." He called pt's mother. Pt does not recall any events from today or yesterday, cannot recall much of her PMHx. Pt's mother states pt has hx of same, was "admitted to the ICU for SIRS" because she "was confused, her WBC count was elevated, her BP was low and her Na and K were low." Mother states the cause for all was never found. Pt's mother does report pt was off lyrica for 6 weeks and restarted it yesterday. Pt currently denies any specific complaints. Denies CP/palpitations, no SOB/cough, no abd pain, no N/V/D, no fevers.    Past Medical History  Diagnosis Date  . Thyroid disease     Hypothyroidism  . Goiter   . Depression   . Hypertension   . Fibromyalgia   . Arthritis   . Elevated WBC count     unknown cause  . Anxiety   . Memory loss since 03/2012  . Chronic pain    Past Surgical History  Procedure Laterality Date  . Cesarean section  1995, 2001  . Partial hysterectomy  2009  . Abdominal hysterectomy    . Appendectomy    . Abdominal surgery    . Tonsillectomy  1998   Family History  Problem Relation Age of Onset  . Hypertension Other     Parent   History  Substance Use Topics  . Smoking status: Current Every Day Smoker -- 1.00 packs/day for 10 years  . Smokeless tobacco: Never Used  . Alcohol Use: No    Review of Systems ROS: Statement: All systems negative except as marked or noted in the  HPI; Constitutional: Negative for fever and chills. ; ; Eyes: Negative for eye pain, redness and discharge. ; ; ENMT: Negative for ear pain, hoarseness, nasal congestion, sinus pressure and sore throat. ; ; Cardiovascular: Negative for chest pain, palpitations, diaphoresis, dyspnea and peripheral edema. ; ; Respiratory: Negative for cough, wheezing and stridor. ; ; Gastrointestinal: Negative for nausea, vomiting, diarrhea, abdominal pain, blood in stool, hematemesis, jaundice and rectal bleeding. . ; ; Genitourinary: Negative for dysuria, flank pain and hematuria. ; ; Musculoskeletal: Negative for back pain and neck pain. Negative for swelling and trauma.; ; Skin: Negative for pruritus, rash, abrasions, blisters, bruising and skin lesion.; ; Neuro: Negative for headache, lightheadedness and neck stiffness. Negative for weakness, extremity weakness, paresthesias, involuntary movement, seizure and +AMS, syncope.     Allergies  Hct and Lyrica  Home Medications   Current Outpatient Rx  Name  Route  Sig  Dispense  Refill  . amphetamine-dextroamphetamine (ADDERALL XR) 30 MG 24 hr capsule   Oral   Take 30 mg by mouth every morning.         . clonazePAM (KLONOPIN) 0.5 MG tablet   Oral   Take 0.5 mg by mouth 2 (two)  times daily.         . DULoxetine (CYMBALTA) 60 MG capsule   Oral   Take 60 mg by mouth daily.         Marland Kitchen levothyroxine (SYNTHROID, LEVOTHROID) 88 MCG tablet   Oral   Take 88 mcg by mouth daily.         Marland Kitchen lisinopril (PRINIVIL,ZESTRIL) 20 MG tablet   Oral   Take 20 mg by mouth daily.         . meloxicam (MOBIC) 15 MG tablet   Oral   Take 15 mg by mouth daily.         Marland Kitchen oxyCODONE-acetaminophen (PERCOCET/ROXICET) 5-325 MG per tablet   Oral   Take 1-2 tablets by mouth every 6 (six) hours as needed for pain.   15 tablet   0   . pregabalin (LYRICA) 300 MG capsule   Oral   Take 300 mg by mouth daily.         . traMADol (ULTRAM) 50 MG tablet   Oral   Take 50 mg  by mouth every 6 (six) hours as needed for pain.         Marland Kitchen zolpidem (AMBIEN) 10 MG tablet   Oral   Take 10 mg by mouth at bedtime as needed for sleep.          BP 93/59  Pulse 103  Resp 18  SpO2 98% Filed Vitals:   05/17/13 1056 05/17/13 1058 05/17/13 1102 05/17/13 1223  BP: 94/64 89/75 93/59  108/54  Pulse: 97 107 103 78  Resp:  18  19  SpO2:    98%    Physical Exam 1000: Physical examination:  Nursing notes reviewed; Vital signs and O2 SAT reviewed;  Constitutional: Well developed, Well nourished, In no acute distress; Head:  Normocephalic, atraumatic; Eyes: EOMI, PERRL, No scleral icterus; ENMT: Mouth and pharynx normal, Mucous membranes dry; Neck: Supple, Full range of motion, No lymphadenopathy. No meningeal signs; Cardiovascular: Regular rate and rhythm, No gallop; Respiratory: Breath sounds clear & equal bilaterally, No wheezes.  Speaking full sentences with ease, Normal respiratory effort/excursion; Chest: Nontender, Movement normal; Abdomen: Soft, Nontender, Nondistended, Normal bowel sounds; Genitourinary: No CVA tenderness; Extremities: Pulses normal, No tenderness, No edema, No calf edema or asymmetry.; Neuro: Awake, alert, confused re: events. Falls asleep while talking to her, but awakens to her name. Major CN grossly intact.Speech slurred.  No facial droop.  No nystagmus. Grips equal. Strength 5/5 equal bilat UE's and LE's.  DTR 2/4 equal bilat UE's and LE's.  No gross sensory deficits.  Normal cerebellar testing bilat UE's (finger-nose) and LE's (heel-shin).; Skin: Color normal, Warm, Dry.   ED Course  Procedures   EKG Interpretation    Date/Time:  Saturday May 17 2013 09:05:44 EST Ventricular Rate:  92 PR Interval:  140 QRS Duration: 82 QT Interval:  370 QTC Calculation: 457 R Axis:   29 Text Interpretation:  Normal sinus rhythm Normal ECG When compared with ECG of 11/03/2012 No significant change was found Confirmed by [facility]  MD, Nunzio Cory [phone]) on  05/17/2013 11:19:23 AM            MDM  MDM Reviewed: previous chart, nursing note and vitals Reviewed previous: labs and ECG Interpretation: labs, ECG, x-ray and CT scan     Results for orders placed during the hospital encounter of 05/17/13  CBC WITH DIFFERENTIAL      Result Value Range   WBC 13.1 (*) 4.0 - 10.5 K/uL  RBC 4.13  3.87 - 5.11 MIL/uL   Hemoglobin 13.2  12.0 - 15.0 g/dL   HCT 38.0  36.0 - 46.0 %   MCV 92.0  78.0 - 100.0 fL   MCH 32.0  26.0 - 34.0 pg   MCHC 34.7  30.0 - 36.0 g/dL   RDW 14.0  11.5 - 15.5 %   Platelets 346  150 - 400 K/uL   Neutrophils Relative % 68  43 - 77 %   Neutro Abs 9.0 (*) 1.7 - 7.7 K/uL   Lymphocytes Relative 21  12 - 46 %   Lymphs Abs 2.7  0.7 - 4.0 K/uL   Monocytes Relative 6  3 - 12 %   Monocytes Absolute 0.7  0.1 - 1.0 K/uL   Eosinophils Relative 5  0 - 5 %   Eosinophils Absolute 0.7  0.0 - 0.7 K/uL   Basophils Relative 0  0 - 1 %   Basophils Absolute 0.0  0.0 - 0.1 K/uL  COMPREHENSIVE METABOLIC PANEL      Result Value Range   Sodium 134 (*) 137 - 147 mEq/L   Potassium 3.0 (*) 3.7 - 5.3 mEq/L   Chloride 97  96 - 112 mEq/L   CO2 25  19 - 32 mEq/L   Glucose, Bld 109 (*) 70 - 99 mg/dL   BUN 18  6 - 23 mg/dL   Creatinine, Ser 1.58 (*) 0.50 - 1.10 mg/dL   Calcium 10.2  8.4 - 10.5 mg/dL   Total Protein 6.9  6.0 - 8.3 g/dL   Albumin 3.5  3.5 - 5.2 g/dL   AST 51 (*) 0 - 37 U/L   ALT 51 (*) 0 - 35 U/L   Alkaline Phosphatase 86  39 - 117 U/L   Total Bilirubin 0.3  0.3 - 1.2 mg/dL   GFR calc non Af Amer 39 (*) >90 mL/min   GFR calc Af Amer 45 (*) >90 mL/min  URINALYSIS, ROUTINE W REFLEX MICROSCOPIC      Result Value Range   Color, Urine STRAW (*) YELLOW   APPearance CLEAR  CLEAR   Specific Gravity, Urine <1.005 (*) 1.005 - 1.030   pH 6.5  5.0 - 8.0   Glucose, UA NEGATIVE  NEGATIVE mg/dL   Hgb urine dipstick SMALL (*) NEGATIVE   Bilirubin Urine NEGATIVE  NEGATIVE   Ketones, ur NEGATIVE  NEGATIVE mg/dL   Protein, ur  NEGATIVE  NEGATIVE mg/dL   Urobilinogen, UA 0.2  0.0 - 1.0 mg/dL   Nitrite NEGATIVE  NEGATIVE   Leukocytes, UA NEGATIVE  NEGATIVE  URINE RAPID DRUG SCREEN (HOSP PERFORMED)      Result Value Range   Opiates POSITIVE (*) NONE DETECTED   Cocaine NONE DETECTED  NONE DETECTED   Benzodiazepines NONE DETECTED  NONE DETECTED   Amphetamines POSITIVE (*) NONE DETECTED   Tetrahydrocannabinol NONE DETECTED  NONE DETECTED   Barbiturates NONE DETECTED  NONE DETECTED  ACETAMINOPHEN LEVEL      Result Value Range   Acetaminophen (Tylenol), Serum <15.0  10 - 30 ug/mL  SALICYLATE LEVEL      Result Value Range   Salicylate Lvl 123456 (*) 2.8 - 20.0 mg/dL  ETHANOL      Result Value Range   Alcohol, Ethyl (B) <11  0 - 11 mg/dL  PREGNANCY, URINE      Result Value Range   Preg Test, Ur NEGATIVE  NEGATIVE  URINE MICROSCOPIC-ADD ON      Result Value Range  Squamous Epithelial / LPF FEW (*) RARE   WBC, UA 0-2  <3 WBC/hpf   RBC / HPF 0-2  <3 RBC/hpf   Bacteria, UA RARE  RARE   Dg Chest 2 View 05/17/2013   CLINICAL DATA:  Altered mental status, difficulty breathing  EXAM: CHEST  2 VIEW  COMPARISON:  11/20/2011  FINDINGS: The heart size and mediastinal contours are within normal limits. Both lungs are clear. The visualized skeletal structures are unremarkable.  IMPRESSION: No active cardiopulmonary disease.   Electronically Signed   By: Inez Catalina M.D.   On: 05/17/2013 10:55   Ct Head Wo Contrast 05/17/2013   CLINICAL DATA:  Altered mental status and twitching  EXAM: CT HEAD WITHOUT CONTRAST  TECHNIQUE: Contiguous axial images were obtained from the base of the skull through the vertex without intravenous contrast.  COMPARISON:  CT HEAD W/O CM dated 09/16/2012; MR HEAD W/O CM dated 09/17/2012  FINDINGS: Sinuses/Soft tissues: Clear paranasal sinuses and mastoid air cells.  Intracranial: Motion degradation, despite 2 attempts. This is mild to moderate. Given the extent of motion degradation, no mass lesion, hemorrhage,  hydrocephalus, acute infarct, intra-axial, or extra-axial fluid collection.  IMPRESSION: 1. Mild to moderately motion degraded exam. 2. Given this factor, no acute intracranial abnormality.   Electronically Signed   By: Abigail Miyamoto M.D.   On: 05/17/2013 10:44   Results for KAROLYNE, TIMMONS (MRN [id number]) as of 05/17/2013 12:56  Ref. Range 09/19/2012 06:15 11/03/2012 14:46 11/04/2012 12:10 05/17/2013 09:19  BUN Latest Range: 6-23 mg/dL 15 5 (L) 5 (L) 18  Creatinine Latest Range: 0.50-1.10 mg/dL 0.90 0.62 0.62 1.58 (H)    1150:  Orthostatic on VS. BUN/Cr elevated from previous; will continue IVF.  Potassium repleted IV. Pt continues confused, but neuro exam otherwise intact, she is lethargic but becomes awake/alert when her name is spoken, resps easy, NAD. Dx and testing d/w pt and family.  Questions answered.  Verb understanding, agreeable to admit.  T/C to [facility]. Caryn Section, case discussed, including:  HPI, pertinent PM/SHx, VS/PE, dx testing, ED course and treatment:  Agreeable to admit, requests to write temporary orders, obtain tele bed to team 2.   Alfonzo Feller, DO 05/20/13 1102

## 2013-05-17 NOTE — ED Notes (Signed)
Son reports pt was normal around 8pm last night.  At 10pm pt woke son up confused, told son it was time for school.  Pt's son went back to bed and this am around 0600 found pt laying on the floor asleep.  Pt responsive to voice.  Pt's mother in ED with pt and reports pt has problems with her NA and K dropping and she gets confused.  Also reports pt takes lyrica for fibromyalgia and was off of it for approx 6 weeks.  Reports started taking it again yesterday.  Pupils equal and reactive, grips strong and equal, face symmetrical, and pt's leg strength equal.  Pt c/o chronic lower back pain.  Pt presently oriented to person, place, and time.  Pt's son and mother at bedside.

## 2013-05-18 DIAGNOSIS — M6282 Rhabdomyolysis: Secondary | ICD-10-CM

## 2013-05-18 DIAGNOSIS — T887XXA Unspecified adverse effect of drug or medicament, initial encounter: Secondary | ICD-10-CM

## 2013-05-18 LAB — CBC
HEMATOCRIT: 35.4 % — AB (ref 36.0–46.0)
HEMOGLOBIN: 12.3 g/dL (ref 12.0–15.0)
MCH: 32.3 pg (ref 26.0–34.0)
MCHC: 34.7 g/dL (ref 30.0–36.0)
MCV: 92.9 fL (ref 78.0–100.0)
Platelets: 301 10*3/uL (ref 150–400)
RBC: 3.81 MIL/uL — ABNORMAL LOW (ref 3.87–5.11)
RDW: 14.1 % (ref 11.5–15.5)
WBC: 9.5 10*3/uL (ref 4.0–10.5)

## 2013-05-18 LAB — COMPREHENSIVE METABOLIC PANEL
ALK PHOS: 75 U/L (ref 39–117)
ALT: 37 U/L — ABNORMAL HIGH (ref 0–35)
AST: 40 U/L — ABNORMAL HIGH (ref 0–37)
Albumin: 2.9 g/dL — ABNORMAL LOW (ref 3.5–5.2)
BUN: 13 mg/dL (ref 6–23)
CALCIUM: 8.5 mg/dL (ref 8.4–10.5)
CO2: 22 mEq/L (ref 19–32)
CREATININE: 1.11 mg/dL — AB (ref 0.50–1.10)
Chloride: 110 mEq/L (ref 96–112)
GFR calc Af Amer: 69 mL/min — ABNORMAL LOW (ref >90)
GFR calc non Af Amer: 60 mL/min — ABNORMAL LOW (ref >90)
GLUCOSE: 103 mg/dL — AB (ref 70–99)
Potassium: 4.1 mEq/L (ref 3.7–5.3)
Sodium: 143 mEq/L (ref 137–147)
Total Bilirubin: 0.4 mg/dL (ref 0.3–1.2)
Total Protein: 6.2 g/dL (ref 6.0–8.3)

## 2013-05-18 LAB — CK TOTAL AND CKMB (NOT AT ARMC)
CK, MB: 103.7 ng/mL (ref 0.3–4.0)
Relative Index: 4.4 — ABNORMAL HIGH (ref 0.0–2.5)
Total CK: 2331 U/L — ABNORMAL HIGH (ref 7–177)

## 2013-05-18 LAB — TSH: TSH: 4.15 u[IU]/mL (ref 0.350–4.500)

## 2013-05-18 LAB — MRSA PCR SCREENING: MRSA by PCR: NEGATIVE

## 2013-05-18 LAB — TROPONIN I

## 2013-05-18 MED ORDER — DULOXETINE HCL 30 MG PO CPEP
30.0000 mg | ORAL_CAPSULE | Freq: Every day | ORAL | Status: DC
  Administered 2013-05-18 – 2013-05-19 (×2): 30 mg via ORAL
  Filled 2013-05-18 (×2): qty 1

## 2013-05-18 MED ORDER — AMPHETAMINE-DEXTROAMPHET ER 20 MG PO CP24: 20.0000 mg | ORAL_CAPSULE | Freq: Every day | ORAL | Status: DC

## 2013-05-18 MED ORDER — CLONAZEPAM 0.5 MG PO TABS
0.2500 mg | ORAL_TABLET | Freq: Two times a day (BID) | ORAL | Status: DC
  Administered 2013-05-18 – 2013-05-19 (×3): 0.25 mg via ORAL
  Filled 2013-05-18 (×3): qty 1

## 2013-05-18 MED ORDER — POTASSIUM CHLORIDE IN NACL 20-0.9 MEQ/L-% IV SOLN
INTRAVENOUS | Status: DC
  Administered 2013-05-18 – 2013-05-19 (×2): via INTRAVENOUS

## 2013-05-18 NOTE — Progress Notes (Signed)
[facility] PROGRESS NOTE  [name] GLO:[id number] DOB: 11-Oct-1970 DOA: 05/17/2013 PCP: Aura Dials, PA-C    Code Status: Full code Family Communication: Discuss with her mother 05/17/13. Disposition Plan: To be determined.   Consultants:  None  Procedures:  EEG, pending  Antibiotics:  None  HPI/Subjective: Apparently, the patient became agitated and confused last night. She was given Haldol which calmed her down. This morning, she is alert and much more oriented compared to yesterday. She does not recall what happened yesterday, but overnight, she remembers being agitated because she could not get out of bed. I informed her that that was my order. Currently, she has no complaints of headache, chest pain, shortness of breath, or dizziness.  Objective: Filed Vitals:   05/18/13 1505  BP:   Pulse:   Temp: 97.7 F (36.5 C)  Resp:     Intake/Output Summary (Last 24 hours) at 05/18/13 1506 Last data filed at 05/18/13 1300  Gross per 24 hour  Intake 2277.08 ml  Output      0 ml  Net 2277.08 ml   Filed Weights   05/17/13 2200 05/18/13 0500  Weight: 69 kg (152 lb 1.9 oz) 69.7 kg (153 lb 10.6 oz)    Exam:   General:  43 year old Caucasian woman, lying in bed, in no acute distress. She is clearly no longer encephalopathic.  Cardiovascular: S1, S2, with a soft systolic murmur.  Respiratory: Clear to auscultation bilaterally.  Abdomen: Positive bowel sounds, soft, nontender, nondistended.  Musculoskeletal/extremities: Good muscle tone. No acute hot red joints. No pedal edema. Pedal pulses palpable.   Neuropsychiatric: She is not encephalopathic. She is alert and oriented to herself, place, city, and year. Her speech is clear and without dysarthria or any abnormalities. Cranial nerves II through XII are grossly intact. No evidence of jerky motions of her arms or legs. She denies suicidal intent. She is not sure what happened at home other than  acknowledging that she was recently restarted Lyrica and recently given Flexeril and oxycodone for back pain. She does not recall why he Lyrica was stopped before it was restarted.  Data Reviewed: Basic Metabolic Panel:  Recent Labs Lab 05/17/13 0919 05/17/13 1405 05/18/13 0229  NA 134*  --  143  K 3.0*  --  4.1  CL 97  --  110  CO2 25  --  22  GLUCOSE 109*  --  103*  BUN 18  --  13  CREATININE 1.58*  --  1.11*  CALCIUM 10.2  --  8.5  MG  --  2.0  --    Liver Function Tests:  Recent Labs Lab 05/17/13 0919 05/18/13 0229  AST 51* 40*  ALT 51* 37*  ALKPHOS 86 75  BILITOT 0.3 0.4  PROT 6.9 6.2  ALBUMIN 3.5 2.9*   No results found for this basename: LIPASE, AMYLASE,  in the last 168 hours No results found for this basename: AMMONIA,  in the last 168 hours CBC:  Recent Labs Lab 05/17/13 0919 05/18/13 0229  WBC 13.1* 9.5  NEUTROABS 9.0*  --   HGB 13.2 12.3  HCT 38.0 35.4*  MCV 92.0 92.9  PLT 346 301   Cardiac Enzymes:  Recent Labs Lab 05/17/13 1405 05/17/13 1929 05/18/13 0229  CKTOTAL 3409*  --  2331*  CKMB  --   --  103.7*  TROPONINI <0.30 <0.30 <0.30   BNP (last 3 results) No results found for this basename: PROBNP,  in the last 8760 hours CBG:  No results found for this basename: GLUCAP,  in the last 168 hours  Recent Results (from the past 240 hour(s))  MRSA PCR SCREENING     Status: None   Collection Time    05/17/13 11:56 PM      Result Value Range Status   MRSA by PCR NEGATIVE  NEGATIVE Final   Comment:            The GeneXpert MRSA Assay (FDA     approved for NASAL specimens     only), is one component of a     comprehensive MRSA colonization     surveillance program. It is not     intended to diagnose MRSA     infection nor to guide or     monitor treatment for     MRSA infections.     Studies: Dg Chest 2 View  05/17/2013   CLINICAL DATA:  Altered mental status, difficulty breathing  EXAM: CHEST  2 VIEW  COMPARISON:  11/20/2011   FINDINGS: The heart size and mediastinal contours are within normal limits. Both lungs are clear. The visualized skeletal structures are unremarkable.  IMPRESSION: No active cardiopulmonary disease.   Electronically Signed   By: Inez Catalina M.D.   On: 05/17/2013 10:55   Ct Head Wo Contrast  05/17/2013   CLINICAL DATA:  Altered mental status and twitching  EXAM: CT HEAD WITHOUT CONTRAST  TECHNIQUE: Contiguous axial images were obtained from the base of the skull through the vertex without intravenous contrast.  COMPARISON:  CT HEAD W/O CM dated 09/16/2012; MR HEAD W/O CM dated 09/17/2012  FINDINGS: Sinuses/Soft tissues: Clear paranasal sinuses and mastoid air cells.  Intracranial: Motion degradation, despite 2 attempts. This is mild to moderate. Given the extent of motion degradation, no mass lesion, hemorrhage, hydrocephalus, acute infarct, intra-axial, or extra-axial fluid collection.  IMPRESSION: 1. Mild to moderately motion degraded exam. 2. Given this factor, no acute intracranial abnormality.   Electronically Signed   By: Abigail Miyamoto M.D.   On: 05/17/2013 10:44    Scheduled Meds: . levothyroxine  100 mcg Oral QAC breakfast  . nicotine  7 mg Transdermal Daily   Continuous Infusions: . 0.9 % NaCl with KCl 40 mEq / L 125 mL/hr at 05/18/13 1300   Assessment:  Principal Problem:   Acute encephalopathy Active Problems:   Drug-drug interaction   Muscle jerks during sleep   Depression   Fibromyalgia   Unspecified hypothyroidism   AKI (acute kidney injury)   ADD (attention deficit disorder)   Hypokalemia   Hyponatremia   Transaminitis   Tobacco abuse   1. Acute encephalopathy.  She had a similar episode in June of last year with hypotension, encephalopathy, and a urine drug screen positive for benzodiazepines and amphetamines. During this hospitalization, her urine drug screen was positive for amphetamines and opiates, consistent with her management with Adderall and Klonopin. She is  treated with Klonopin and Adderall for chronic anxiety and ADD respectively. She is also treated with Cymbalta for depression and Ambien for insomnia. Apparently, she was recently restarted on Lyrica. She was also started on oxycodone and Flexeril for pain. Lyrica had been discontinued approximately 6 weeks ago before it was restarted, but she could not tell me why. Interestingly, Lyrica is listed as a "allergy" with the side effect being "it makes me feel stupid". Listed also is tramadol as one of her home medications. With all of these medications on board, it is likely that she has toxic  effects from drug drug interactions, particularly with the addition of Lyrica, cyclobenzaprine, and oxycodone. She is much improved this morning, although, apparently she had some confusion and agitation overnight..  2. Muscle jerks during sleep. Currently resolved. This is likely the toxic effects of Lyrica and its interactions with her other psychotropic medications.  3. Acute kidney injury. Likely from prerenal azotemia/hypovolemia. Resolving on IV fluid hydration. 4. Hyponatremia, secondary to hypovolemia. Currently resolved status post IV fluids. 5. Hypokalemia. Currently supplemented repleted. Now resolved. 6. Rhabdomyolysis. This is likely secondary to falling at home. We'll continue IV fluid hydration. 7. Mild Transaminitis. This likely secondary to rhabdomyolysis. Her liver transaminases are decreasing. 8. Hypothyroidism. We'll continue Synthroid. Her TSH is within normal limits at 4.1.  9. Depression/ADD/chronic anxiety. As above, she is treated chronically with Cymbalta and Adderall respectively. She is also treated with Klonopin for anxiety. These medications have been on hold secondary to her encephalopathy but it will be restarted at lower doses. There was no evidence of intentional overdose or suicidal intention.  10. Fibromyalgia. Apparently she was recently restarted on Lyrica, Flexeril, and  oxycodone. She had been treated with tramadol. All are now on hold. We'll treat her pain with as needed Tylenol for now.  11. Tobacco abuse. The patient was advised to stop smoking.     Plan: 1. Restart Klonopin and Cymbalta at lower doses. Restart Adderall tomorrow. 2. Continue IV fluid hydration with less potassium in the IV fluids. 3. EEG ordered for tomorrow. 4. Will recheck her total CK tomorrow. 5. Ambulate with assistance. 6. Consider referral to outpatient psychology or psychiatry. 7. The patient was advised to discontinue Lyrica indefinitely.  Time spent: 40 minutes.    [facility] Pager [phone]. If 7PM-7AM, please contact night-coverage at [email or web address], password [facility] 05/18/2013, 3:06 PM  LOS: 1 day

## 2013-05-18 NOTE — Progress Notes (Signed)
Utilization review completed.  

## 2013-05-18 NOTE — Progress Notes (Signed)
Patient transferred to 300 department via wheelchair and report called.

## 2013-05-18 NOTE — Progress Notes (Signed)
Pt was getting very agitated as well as confused. Additionally, she spent over an hour picking at her bed linens. Pt was conversing with me, and then started whistling and calling for a dog that was not in the room. I asked her if she knew where she was, and she said the hospital. Later, she started crying without provocation.  She also stated that she felt like she has been confused for some time, and that other members of her family have noticed like her father, who she said has been calling more frequently due to being concerned about her wellbeing. PA was notified, and haldol was ordered and given.

## 2013-05-19 LAB — COMPREHENSIVE METABOLIC PANEL
ALT: 27 U/L (ref 0–35)
AST: 21 U/L (ref 0–37)
Albumin: 2.9 g/dL — ABNORMAL LOW (ref 3.5–5.2)
Alkaline Phosphatase: 80 U/L (ref 39–117)
BUN: 12 mg/dL (ref 6–23)
CO2: 20 meq/L (ref 19–32)
Calcium: 8.4 mg/dL (ref 8.4–10.5)
Chloride: 112 mEq/L (ref 96–112)
Creatinine, Ser: 0.69 mg/dL (ref 0.50–1.10)
GFR calc non Af Amer: >90 mL/min (ref >90)
GLUCOSE: 118 mg/dL — AB (ref 70–99)
POTASSIUM: 4.3 meq/L (ref 3.7–5.3)
Sodium: 143 mEq/L (ref 137–147)
TOTAL PROTEIN: 6.3 g/dL (ref 6.0–8.3)
Total Bilirubin: 0.2 mg/dL — ABNORMAL LOW (ref 0.3–1.2)

## 2013-05-19 LAB — CBC
HCT: 37.4 % (ref 36.0–46.0)
HEMOGLOBIN: 12.7 g/dL (ref 12.0–15.0)
MCH: 31.6 pg (ref 26.0–34.0)
MCHC: 34 g/dL (ref 30.0–36.0)
MCV: 93 fL (ref 78.0–100.0)
Platelets: 294 10*3/uL (ref 150–400)
RBC: 4.02 MIL/uL (ref 3.87–5.11)
RDW: 13.9 % (ref 11.5–15.5)
WBC: 11.3 10*3/uL — ABNORMAL HIGH (ref 4.0–10.5)

## 2013-05-19 LAB — CK: Total CK: 813 U/L — ABNORMAL HIGH (ref 7–177)

## 2013-05-19 NOTE — Discharge Summary (Signed)
The patient was seen and examined. She was discussed with nurse practitioner, Ms. Renard Hamper. The patient is much improved clinically and symptomatically. To reiterate, the patient was instructed to discontinue Lyrica indefinitely and to discontinue cyclobenzaprine. All of her psychotropic medications were initially withheld during hospitalization and was she improved clinically and symptomatically, all were restarted at discharge with exception of Lyrica and cyclobenzaprine. It was obvious that the patient had drug-drug interactions exacerbated by the recent addition of Lyrica, cyclobenzaprine, and oxycodone which caused toxic metabolic encephalopathy. EEG was considered, but was canceled as the patient improved without any evidence of seizures or known seizures.

## 2013-05-19 NOTE — Progress Notes (Signed)
Patient states understanding of discharge instructions.  

## 2013-05-19 NOTE — Discharge Summary (Signed)
Physician Discharge Summary  [name] SEG:[id number] DOB: [birth year] DOA: 05/17/2013  PCP: Aura Dials, PA-C  Admit date: 05/17/2013 Discharge date: 05/19/2013  Time spent: 40 minutes  Recommendations for Outpatient Follow-up:  1. Has appointment with [name] PA at [facility] 05/22/13. Recommend review of medications and evaluation of need for counseling.   Discharge Diagnoses:    Acute encephalopathy   Depression   Fibromyalgia   Unspecified hypothyroidism   AKI (acute kidney injury)   ADD (attention deficit disorder)   Hypokalemia   Hyponatremia   Transaminitis   Tobacco abuse   Muscle jerks during sleep   Drug-drug interaction   Rhabdomyolysis   Discharge Condition: stable  Diet recommendation: heart healthy  Filed Weights   05/17/13 2200 05/18/13 0500  Weight: 69 kg (152 lb 1.9 oz) 69.7 kg (153 lb 10.6 oz)    History of present illness:  Sharon Atkins is a 43 y.o. female with a history of fibromyalgia, depression, and ADD. She also has a history of SIRS/sepsis, acute encephalopathy, an acute renal injury in June 2014. During that hospitalization, her urine drug screen was positive for amphetamines and benzodiazepines. The workup was essentially negative for an infectious source of her sepsis/hypotension. On 05/17/13 she presented with altered mental status. She was lethargic and intermittently interactive. The history provided by the patient's mother and the ED notes and staff. Accordingly, the patient began to have altered mental status evening of 05/16/13.Marland Kitchen Her son reported that she had awakened him in the middle of the night and told him "it was time for school". He went back to sleep, but then woke up at approximately 6:00 AM to find his mother on the floor. He was able to "wake her up by talking to her". Per the history provided, the patient was recently started on Lyrica, Flexeril, and oxycodone for pain by her primary care provider.  Apparently, she had been off of Lyrica for 6 weeks, but it was restarted yesterday. Her mother reports that the patient has a history of oxycodone addiction requiring treatment.  In the ED the patient was lethargic and able to answer some questions, but without reliability according to her mother. In the ED, the patient's blood pressure ranged from 89/75-108/72. She was oxygenating 98% on supplement oxygen. CT scan of her head revealed no acute intracranial findings. Her chest x-ray revealed no acute cardiopulmonary disease. Her EKG revealed normal sinus rhythm with poor R-wave progression at 92 beats per minute. Her urine drug screen was positive for amphetamines and opiates. Her lab data significant for a serum sodium of 134, potassium of 3.0, WBC of 13.1, and creatinine 1.58. She was admitted for further evaluation and management.   Hospital Course:  1. Acute encephalopathy.  She had a similar episode in June of last year with hypotension, encephalopathy, and a urine drug screen positive for benzodiazepines and amphetamines. During that hospitalization, her urine drug screen was positive for amphetamines and opiates, consistent with her management with Adderall and Klonopin. She is treated with Klonopin and Adderall for chronic anxiety and ADD respectively. She is also treated with Cymbalta for depression and Ambien for insomnia. Apparently, she was recently restarted on Lyrica. She was also started on oxycodone and Flexeril for pain. Lyrica had been discontinued approximately 6 weeks ago before it was restarted, but It is unclear why. Interestingly, Lyrica is listed as a "allergy" with the side effect being "it makes me feel stupid". Listed also is tramadol as one of  her home medications. With all of these medications on board, it is likely that she has toxic effects from drug drug interactions, particularly with the addition of Lyrica, cyclobenzaprine, and oxycodone. On admission  Lyrical was discontinued  and cymbalta dose reduced. She quickly improved. At discharge she is alert and oriented, calm and cooperative. She will see her PCP 05/22/13 for close follow up and evaluation of medications.   2. Muscle jerks during sleep. Resolved. This is likely the toxic effects of Lyrica and its interactions with her other psychotropic medications.   3. Acute kidney injury. Likely from prerenal azotemia/hypovolemia. Resolved at discharge after IV fluid hydration.  4. Hyponatremia, secondary to hypovolemia. Currently resolved status post IV fluids.  5. Hypokalemia. Supplemented and resolved.  6. Rhabdomyolysis. This is likely secondary to falling at home. Provided with  IV fluid hydration. CK 813 at discharge down from 3409 on admission.  7. Mild Transaminitis. This likely secondary to rhabdomyolysis. Resolved at discharge  8. Hypothyroidism. We'll continue Synthroid. Her TSH is within normal limits at 4.1.  9. Depression/ADD/chronic anxiety. As above, she is treated chronically with Cymbalta and Adderall respectively. She is also treated with Klonopin for anxiety. Will continue these medications at discharge.  There was no evidence of intentional overdose or suicidal intention.  10. Fibromyalgia. Apparently she was recently restarted on Lyrica, Flexeril, and oxycodone. She had been treated with tramadol. All held initially. At discharge Lyrica and Flexeril discontinued. Cymbalta, Adderall and Klonopin during hospitalization and she tolerated without problem.   11. Tobacco abuse. The patient was advised to stop smoking.   Procedures:  none  Consultations:  none  Discharge Exam: Filed Vitals:   05/19/13 0515  BP: 133/83  Pulse: 74  Temp: 97.6 F (36.4 C)  Resp: 16    General: well nourished NAD calm cooperative Cardiovascular: RRR No MGR No LE edema Respiratory: normal effort BS are clear bilaterally. i hear no wheeze Neuro: Alert and oriented x3. Her speech is clear. Cranial nerve II-XII  intact  Discharge Instructions     Medication List    STOP taking these medications       cyclobenzaprine 10 MG tablet  Commonly known as:  FLEXERIL     pregabalin 300 MG capsule  Commonly known as:  LYRICA      TAKE these medications       amphetamine-dextroamphetamine 20 MG 24 hr capsule  Commonly known as:  ADDERALL XR  Take 20 mg by mouth daily.     clonazePAM 0.5 MG tablet  Commonly known as:  KLONOPIN  Take 0.5 mg by mouth 2 (two) times daily.     DULoxetine 60 MG capsule  Commonly known as:  CYMBALTA  Take 60 mg by mouth daily.     levothyroxine 100 MCG tablet  Commonly known as:  SYNTHROID, LEVOTHROID  Take 100 mcg by mouth daily before breakfast.     lisinopril 20 MG tablet  Commonly known as:  PRINIVIL,ZESTRIL  Take 20 mg by mouth daily.     traMADol 50 MG tablet  Commonly known as:  ULTRAM  Take 50 mg by mouth 2 (two) times daily.     zolpidem 10 MG tablet  Commonly known as:  AMBIEN  Take 10 mg by mouth at bedtime.       Allergies  Allergen Reactions  . Hct [Hydrochlorothiazide] Other (See Comments)    "affects potassium more than normal"  . Lyrica [Pregabalin] Other (See Comments)    "makes her feel stupid"  Follow-up Information   Follow up with SPENCER,SARA C, PA-C. Schedule an appointment as soon as possible for a visit on 05/22/2013. (has appointment 05/22/13 at 8:15am. recommend evaluation of medications )    Specialty:  Physician Assistant   Contact information:   [place] [id number]        The results of significant diagnostics from this hospitalization (including imaging, microbiology, ancillary and laboratory) are listed below for reference.    Significant Diagnostic Studies: Dg Chest 2 View  05/17/2013   CLINICAL DATA:  Altered mental status, difficulty breathing  EXAM: CHEST  2 VIEW  COMPARISON:  11/20/2011  FINDINGS: The heart size and mediastinal contours are within normal limits. Both  lungs are clear. The visualized skeletal structures are unremarkable.  IMPRESSION: No active cardiopulmonary disease.   Electronically Signed   By: Inez Catalina M.D.   On: 05/17/2013 10:55   Ct Head Wo Contrast  05/17/2013   CLINICAL DATA:  Altered mental status and twitching  EXAM: CT HEAD WITHOUT CONTRAST  TECHNIQUE: Contiguous axial images were obtained from the base of the skull through the vertex without intravenous contrast.  COMPARISON:  CT HEAD W/O CM dated 09/16/2012; MR HEAD W/O CM dated 09/17/2012  FINDINGS: Sinuses/Soft tissues: Clear paranasal sinuses and mastoid air cells.  Intracranial: Motion degradation, despite 2 attempts. This is mild to moderate. Given the extent of motion degradation, no mass lesion, hemorrhage, hydrocephalus, acute infarct, intra-axial, or extra-axial fluid collection.  IMPRESSION: 1. Mild to moderately motion degraded exam. 2. Given this factor, no acute intracranial abnormality.   Electronically Signed   By: Abigail Miyamoto M.D.   On: 05/17/2013 10:44    Microbiology: Recent Results (from the past 240 hour(s))  MRSA PCR SCREENING     Status: None   Collection Time    05/17/13 11:56 PM      Result Value Range Status   MRSA by PCR NEGATIVE  NEGATIVE Final   Comment:            The GeneXpert MRSA Assay (FDA     approved for NASAL specimens     only), is one component of a     comprehensive MRSA colonization     surveillance program. It is not     intended to diagnose MRSA     infection nor to guide or     monitor treatment for     MRSA infections.     Labs: Basic Metabolic Panel:  Recent Labs Lab 05/17/13 0919 05/17/13 1405 05/18/13 0229 05/19/13 0522  NA 134*  --  143 143  K 3.0*  --  4.1 4.3  CL 97  --  110 112  CO2 25  --  22 20  GLUCOSE 109*  --  103* 118*  BUN 18  --  13 12  CREATININE 1.58*  --  1.11* 0.69  CALCIUM 10.2  --  8.5 8.4  MG  --  2.0  --   --    Liver Function Tests:  Recent Labs Lab 05/17/13 0919 05/18/13 0229  05/19/13 0522  AST 51* 40* 21  ALT 51* 37* 27  ALKPHOS 86 75 80  BILITOT 0.3 0.4 0.2*  PROT 6.9 6.2 6.3  ALBUMIN 3.5 2.9* 2.9*   No results found for this basename: LIPASE, AMYLASE,  in the last 168 hours No results found for this basename: AMMONIA,  in the last 168 hours CBC:  Recent Labs Lab 05/17/13 0919 05/18/13 0229 05/19/13  0522  WBC 13.1* 9.5 11.3*  NEUTROABS 9.0*  --   --   HGB 13.2 12.3 12.7  HCT 38.0 35.4* 37.4  MCV 92.0 92.9 93.0  PLT 346 301 294   Cardiac Enzymes:  Recent Labs Lab 05/17/13 1405 05/17/13 1929 05/18/13 0229 05/19/13 0522  CKTOTAL 3409*  --  2331* 813*  CKMB  --   --  103.7*  --   TROPONINI <0.30 <0.30 <0.30  --    BNP: BNP (last 3 results) No results found for this basename: PROBNP,  in the last 8760 hours CBG: No results found for this basename: GLUCAP,  in the last 168 hours     Signed:  Radene Gunning  [facility] 05/19/2013, 11:03 AM

## 2013-05-22 DIAGNOSIS — F331 Major depressive disorder, recurrent, moderate: Secondary | ICD-10-CM | POA: Insufficient documentation

## 2013-06-08 ENCOUNTER — Emergency Department (HOSPITAL_COMMUNITY)
Admission: EM | Admit: 2013-06-08 | Discharge: 2013-06-08 | Disposition: A | Discharge status: Home / Self Care | Payer: Medicaid Other | Attending: Emergency Medicine | Admitting: Emergency Medicine

## 2013-06-08 ENCOUNTER — Encounter (HOSPITAL_COMMUNITY): Payer: Self-pay | Admitting: Emergency Medicine

## 2013-06-08 DIAGNOSIS — R000 Tachycardia, unspecified: Secondary | ICD-10-CM | POA: Insufficient documentation

## 2013-06-08 DIAGNOSIS — Z8739 Personal history of other diseases of the musculoskeletal system and connective tissue: Secondary | ICD-10-CM | POA: Insufficient documentation

## 2013-06-08 DIAGNOSIS — J069 Acute upper respiratory infection, unspecified: Secondary | ICD-10-CM | POA: Insufficient documentation

## 2013-06-08 DIAGNOSIS — E049 Nontoxic goiter, unspecified: Secondary | ICD-10-CM | POA: Insufficient documentation

## 2013-06-08 DIAGNOSIS — E039 Hypothyroidism, unspecified: Secondary | ICD-10-CM | POA: Insufficient documentation

## 2013-06-08 DIAGNOSIS — G8929 Other chronic pain: Secondary | ICD-10-CM | POA: Insufficient documentation

## 2013-06-08 DIAGNOSIS — I10 Essential (primary) hypertension: Secondary | ICD-10-CM | POA: Insufficient documentation

## 2013-06-08 DIAGNOSIS — F17200 Nicotine dependence, unspecified, uncomplicated: Secondary | ICD-10-CM | POA: Insufficient documentation

## 2013-06-08 DIAGNOSIS — F411 Generalized anxiety disorder: Secondary | ICD-10-CM | POA: Insufficient documentation

## 2013-06-08 DIAGNOSIS — Z79899 Other long term (current) drug therapy: Secondary | ICD-10-CM | POA: Insufficient documentation

## 2013-06-08 DIAGNOSIS — F988 Other specified behavioral and emotional disorders with onset usually occurring in childhood and adolescence: Secondary | ICD-10-CM | POA: Insufficient documentation

## 2013-06-08 DIAGNOSIS — F329 Major depressive disorder, single episode, unspecified: Secondary | ICD-10-CM | POA: Insufficient documentation

## 2013-06-08 DIAGNOSIS — F3289 Other specified depressive episodes: Secondary | ICD-10-CM | POA: Insufficient documentation

## 2013-06-08 MED ORDER — BENZONATATE 100 MG PO CAPS: 100.0000 mg | ORAL_CAPSULE | Freq: Three times a day (TID) | ORAL | Status: DC

## 2013-06-08 NOTE — ED Notes (Signed)
Pt c/o flu like symptoms since Friday-body aches, cough, fever and nausea; vomited on Friday but denies now, denies diarrhea

## 2013-06-08 NOTE — Discharge Instructions (Signed)

## 2013-06-08 NOTE — ED Provider Notes (Signed)
CSN: [id number]     Arrival date & time 06/08/13  1120 History  This chart was scribed for Sharon Atkins B. Karle Starch, MD by Roxan Diesel, ED scribe.  This patient was seen in room APFT20/APFT20 and the patient's care was started at 11:53 AM.    Chief Complaint  Patient presents with  . Influenza    The history is provided by the patient. No language interpreter was used.    HPI Comments: Sharon Atkins is a 43 y.o. female who presents to the Emergency Department complaining of 2 days of persistent worsening URI symptoms.  Pt reports she initially developed nausea and vomited several times.  This resolved but she has since developed sore throat, generalized body aches, chills, productive cough, and decreased appetite.  She denies diarrhea, measured fever, or rash.  She states she has been tolerating fluids well.  Pt is a current smoker.  PCP is Dr. [name]   Past Medical History  Diagnosis Date  . Thyroid disease     Hypothyroidism  . Goiter   . Depression   . Hypertension   . Fibromyalgia   . Arthritis   . Elevated WBC count     unknown cause  . Anxiety   . Memory loss since 03/2012  . Chronic pain   . ADD (attention deficit disorder)   . Tobacco abuse     Past Surgical History  Procedure Laterality Date  . Cesarean section  1995, 2001  . Partial hysterectomy  2009  . Abdominal hysterectomy    . Appendectomy    . Abdominal surgery    . Tonsillectomy  1998    Family History  Problem Relation Age of Onset  . Hypertension Other     Parent    History  Substance Use Topics  . Smoking status: Current Every Day Smoker -- 1.00 packs/day for 10 years    Types: Cigarettes  . Smokeless tobacco: Never Used  . Alcohol Use: No    OB History   Grav Para Term Preterm Abortions TAB SAB Ect Mult Living                   Review of Systems A complete 10 system review of systems was obtained and all systems are negative except as noted in the HPI and PMH.      Allergies  Hct and Lyrica  Home Medications   Current Outpatient Rx  Name  Route  Sig  Dispense  Refill  . amphetamine-dextroamphetamine (ADDERALL XR) 20 MG 24 hr capsule   Oral   Take 20 mg by mouth daily.         . clonazePAM (KLONOPIN) 0.5 MG tablet   Oral   Take 0.5 mg by mouth 2 (two) times daily.         . DULoxetine (CYMBALTA) 60 MG capsule   Oral   Take 60 mg by mouth daily.         Marland Kitchen levothyroxine (SYNTHROID, LEVOTHROID) 100 MCG tablet   Oral   Take 100 mcg by mouth daily before breakfast.         . lisinopril (PRINIVIL,ZESTRIL) 20 MG tablet   Oral   Take 20 mg by mouth daily.         . traMADol (ULTRAM) 50 MG tablet   Oral   Take 50 mg by mouth 2 (two) times daily.          Marland Kitchen zolpidem (AMBIEN) 10 MG tablet   Oral  Take 10 mg by mouth at bedtime.           BP 121/80  Pulse 116  Temp(Src) 98.1 F (36.7 C) (Oral)  Resp 18  Ht 5\' 1"  (1.549 m)  Wt 145 lb (65.772 kg)  BMI 27.41 kg/m2  SpO2 99%  Physical Exam  Nursing note and vitals reviewed. Constitutional: She is oriented to person, place, and time. She appears well-developed and well-nourished.  HENT:  Head: Normocephalic and atraumatic.  Mouth/Throat: Oropharynx is clear and moist. Mucous membranes are dry. No oropharyngeal exudate, posterior oropharyngeal edema or posterior oropharyngeal erythema.  Eyes: EOM are normal. Pupils are equal, round, and reactive to light.  Neck: Normal range of motion. Neck supple.  Cardiovascular: Regular rhythm, normal heart sounds and intact distal pulses.  Tachycardia present.   No murmur heard. Pulmonary/Chest: Effort normal and breath sounds normal. No respiratory distress. She has no wheezes. She has no rales.  Abdominal: Bowel sounds are normal. She exhibits no distension. There is no tenderness.  Musculoskeletal: Normal range of motion. She exhibits no edema and no tenderness.  Neurological: She is alert and oriented to person, place, and  time. She has normal strength. No cranial nerve deficit or sensory deficit.  Skin: Skin is warm and dry. No rash noted.  Psychiatric: She has a normal mood and affect.    ED Course  Procedures (including critical care time)  DIAGNOSTIC STUDIES: Oxygen Saturation is 99% on room air, normal by my interpretation.    COORDINATION OF CARE: 11:58 AM-Informed pt that symptoms are likely due to self-limiting viral infection.  Discussed treatment plan which includes rest, hydration, symptomatic relief, and f/u with PCP if symptoms do not improve.  Pt expressed understanding and agreed with plan.     Labs Review Labs Reviewed - No data to display  Imaging Review No results found.   EKG Interpretation None      MDM   Final diagnoses:  URI, acute    Pt with URI symptoms, has mild tachycardia, ?dehydration but tolerating PO fluids well. Otherwise looks well. Advised plenty of fluid, rest and OTC symptomatic medications.   I personally performed the services described in this documentation, which was scribed in my presence. The recorded information has been reviewed and is accurate.      Abdulloh Ullom B. Karle Starch, MD 06/08/13 1233

## 2013-11-21 ENCOUNTER — Emergency Department (HOSPITAL_COMMUNITY)
Admission: EM | Admit: 2013-11-21 | Discharge: 2013-11-21 | Disposition: A | Discharge status: Home / Self Care | Payer: Medicaid Other | Attending: Emergency Medicine | Admitting: Emergency Medicine

## 2013-11-21 ENCOUNTER — Emergency Department (HOSPITAL_COMMUNITY): Payer: Medicaid Other

## 2013-11-21 ENCOUNTER — Encounter (HOSPITAL_COMMUNITY): Payer: Self-pay | Admitting: Emergency Medicine

## 2013-11-21 DIAGNOSIS — F329 Major depressive disorder, single episode, unspecified: Secondary | ICD-10-CM | POA: Insufficient documentation

## 2013-11-21 DIAGNOSIS — Y9389 Activity, other specified: Secondary | ICD-10-CM | POA: Insufficient documentation

## 2013-11-21 DIAGNOSIS — Y92009 Unspecified place in unspecified non-institutional (private) residence as the place of occurrence of the external cause: Secondary | ICD-10-CM

## 2013-11-21 DIAGNOSIS — F988 Other specified behavioral and emotional disorders with onset usually occurring in childhood and adolescence: Secondary | ICD-10-CM | POA: Diagnosis not present

## 2013-11-21 DIAGNOSIS — M129 Arthropathy, unspecified: Secondary | ICD-10-CM | POA: Diagnosis not present

## 2013-11-21 DIAGNOSIS — G8929 Other chronic pain: Secondary | ICD-10-CM | POA: Diagnosis not present

## 2013-11-21 DIAGNOSIS — R296 Repeated falls: Secondary | ICD-10-CM | POA: Diagnosis not present

## 2013-11-21 DIAGNOSIS — F411 Generalized anxiety disorder: Secondary | ICD-10-CM | POA: Diagnosis not present

## 2013-11-21 DIAGNOSIS — F3289 Other specified depressive episodes: Secondary | ICD-10-CM | POA: Insufficient documentation

## 2013-11-21 DIAGNOSIS — W19XXXA Unspecified fall, initial encounter: Secondary | ICD-10-CM

## 2013-11-21 DIAGNOSIS — Z79899 Other long term (current) drug therapy: Secondary | ICD-10-CM | POA: Diagnosis not present

## 2013-11-21 DIAGNOSIS — F17200 Nicotine dependence, unspecified, uncomplicated: Secondary | ICD-10-CM | POA: Insufficient documentation

## 2013-11-21 DIAGNOSIS — Z791 Long term (current) use of non-steroidal anti-inflammatories (NSAID): Secondary | ICD-10-CM | POA: Insufficient documentation

## 2013-11-21 DIAGNOSIS — IMO0002 Reserved for concepts with insufficient information to code with codable children: Secondary | ICD-10-CM | POA: Insufficient documentation

## 2013-11-21 DIAGNOSIS — S300XXA Contusion of lower back and pelvis, initial encounter: Secondary | ICD-10-CM | POA: Insufficient documentation

## 2013-11-21 DIAGNOSIS — Y929 Unspecified place or not applicable: Secondary | ICD-10-CM | POA: Diagnosis not present

## 2013-11-21 DIAGNOSIS — I10 Essential (primary) hypertension: Secondary | ICD-10-CM | POA: Diagnosis not present

## 2013-11-21 DIAGNOSIS — E039 Hypothyroidism, unspecified: Secondary | ICD-10-CM | POA: Diagnosis not present

## 2013-11-21 LAB — CBC WITH DIFFERENTIAL/PLATELET
Basophils Absolute: 0.1 10*3/uL (ref 0.0–0.1)
Basophils Relative: 1 % (ref 0–1)
EOS ABS: 0.6 10*3/uL (ref 0.0–0.7)
EOS PCT: 4 % (ref 0–5)
HCT: 42.3 % (ref 36.0–46.0)
HEMOGLOBIN: 14.3 g/dL (ref 12.0–15.0)
LYMPHS ABS: 4.4 10*3/uL — AB (ref 0.7–4.0)
LYMPHS PCT: 31 % (ref 12–46)
MCH: 31.3 pg (ref 26.0–34.0)
MCHC: 33.8 g/dL (ref 30.0–36.0)
MCV: 92.6 fL (ref 78.0–100.0)
MONOS PCT: 7 % (ref 3–12)
Monocytes Absolute: 0.9 10*3/uL (ref 0.1–1.0)
Neutro Abs: 8.2 10*3/uL — ABNORMAL HIGH (ref 1.7–7.7)
Neutrophils Relative %: 57 % (ref 43–77)
PLATELETS: 349 10*3/uL (ref 150–400)
RBC: 4.57 MIL/uL (ref 3.87–5.11)
RDW: 13.9 % (ref 11.5–15.5)
WBC: 14.2 10*3/uL — ABNORMAL HIGH (ref 4.0–10.5)

## 2013-11-21 LAB — COMPREHENSIVE METABOLIC PANEL
ALT: 15 U/L (ref 0–35)
ANION GAP: 13 (ref 5–15)
AST: 12 U/L (ref 0–37)
Albumin: 3.7 g/dL (ref 3.5–5.2)
Alkaline Phosphatase: 108 U/L (ref 39–117)
BUN: 7 mg/dL (ref 6–23)
CO2: 24 meq/L (ref 19–32)
Calcium: 9.5 mg/dL (ref 8.4–10.5)
Chloride: 105 mEq/L (ref 96–112)
Creatinine, Ser: 1.11 mg/dL — ABNORMAL HIGH (ref 0.50–1.10)
GFR, EST AFRICAN AMERICAN: 69 mL/min — AB (ref >90)
GFR, EST NON AFRICAN AMERICAN: 59 mL/min — AB (ref >90)
GLUCOSE: 91 mg/dL (ref 70–99)
Potassium: 3.2 mEq/L — ABNORMAL LOW (ref 3.7–5.3)
SODIUM: 142 meq/L (ref 137–147)
TOTAL PROTEIN: 7.4 g/dL (ref 6.0–8.3)
Total Bilirubin: 0.2 mg/dL — ABNORMAL LOW (ref 0.3–1.2)

## 2013-11-21 LAB — URINALYSIS, ROUTINE W REFLEX MICROSCOPIC
Bilirubin Urine: NEGATIVE
GLUCOSE, UA: NEGATIVE mg/dL
Hgb urine dipstick: NEGATIVE
KETONES UR: NEGATIVE mg/dL
Leukocytes, UA: NEGATIVE
Nitrite: NEGATIVE
PROTEIN: NEGATIVE mg/dL
Specific Gravity, Urine: 1.01 (ref 1.005–1.030)
UROBILINOGEN UA: 0.2 mg/dL (ref 0.0–1.0)
pH: 7 (ref 5.0–8.0)

## 2013-11-21 LAB — LACTIC ACID, PLASMA: Lactic Acid, Venous: 0.9 mmol/L (ref 0.5–2.2)

## 2013-11-21 LAB — TROPONIN I: Troponin I: <0.3 ng/mL (ref <0.30)

## 2013-11-21 MED ORDER — KETOROLAC TROMETHAMINE 30 MG/ML IJ SOLN
30.0000 mg | Freq: Once | INTRAMUSCULAR | Status: AC
  Administered 2013-11-21: 30 mg via INTRAVENOUS
  Filled 2013-11-21: qty 1

## 2013-11-21 MED ORDER — SODIUM CHLORIDE 0.9 % IV SOLN
1000.0000 mL | INTRAVENOUS | Status: DC
Start: 2013-11-21 — End: 2013-11-21

## 2013-11-21 MED ORDER — NAPROXEN 500 MG PO TABS: 500.0000 mg | ORAL_TABLET | Freq: Two times a day (BID) | ORAL | Status: DC

## 2013-11-21 MED ORDER — SODIUM CHLORIDE 0.9 % IV SOLN
1000.0000 mL | Freq: Once | INTRAVENOUS | Status: AC
  Administered 2013-11-21: 1000 mL via INTRAVENOUS

## 2013-11-21 NOTE — ED Provider Notes (Signed)
CSN: [id number]     Arrival date & time 11/21/13  0421 History   First MD Initiated Contact with Patient 11/21/13 [phone]     Chief Complaint  Patient presents with  . Weakness     (Consider location/radiation/quality/duration/timing/severity/associated sxs/prior Treatment) Patient is a 43 y.o. female presenting with weakness. The history is provided by the patient.  Weakness  She states that she got up from bed and felt weak and fell down. She injured her left gluteal area when she fell. She rates pain at 6/10. She checked her pressure was 72/32. She had been admitted to the hospital 6 months ago with low blood pressure. She states that she didn't quite feel right when she went to bed but could not explain it any further. She denies chest pain, heaviness, tightness, pressure. She denies nausea or vomiting. She denies fever or chills. She denies urinary urgency, frequency, tenesmus, dysuria. There's been no constipation or diarrhea.  Past Medical History  Diagnosis Date  . Thyroid disease     Hypothyroidism  . Goiter   . Depression   . Hypertension   . Fibromyalgia   . Arthritis   . Elevated WBC count     unknown cause  . Anxiety   . Memory loss since 03/2012  . Chronic pain   . ADD (attention deficit disorder)   . Tobacco abuse    Past Surgical History  Procedure Laterality Date  . Cesarean section  1995, 2001  . Partial hysterectomy  2009  . Abdominal hysterectomy    . Appendectomy    . Abdominal surgery    . Tonsillectomy  1998   Family History  Problem Relation Age of Onset  . Hypertension Other     Parent   History  Substance Use Topics  . Smoking status: Current Every Day Smoker -- 1.00 packs/day for 10 years    Types: Cigarettes  . Smokeless tobacco: Never Used  . Alcohol Use: No   OB History   Grav Para Term Preterm Abortions TAB SAB Ect Mult Living                 Review of Systems  Neurological: Positive for weakness.  All other systems reviewed and  are negative.     Allergies  Hct and Lyrica  Home Medications   Prior to Admission medications   Medication Sig Start Date End Date Taking? Authorizing Provider  clonazePAM (KLONOPIN) 0.5 MG tablet Take 0.5 mg by mouth 2 (two) times daily.   Yes Historical Provider, MD  DULoxetine (CYMBALTA) 60 MG capsule Take 60 mg by mouth daily.   Yes Historical Provider, MD  fenofibrate 160 MG tablet Take 160 mg by mouth daily.   Yes Historical Provider, MD  levothyroxine (SYNTHROID, LEVOTHROID) 100 MCG tablet Take 100 mcg by mouth daily before breakfast.   Yes Historical Provider, MD  lisdexamfetamine (VYVANSE) 30 MG capsule Take 30 mg by mouth daily.   Yes Historical Provider, MD  lisinopril (PRINIVIL,ZESTRIL) 20 MG tablet Take 20 mg by mouth daily.   Yes Historical Provider, MD  meloxicam (MOBIC) 15 MG tablet Take 15 mg by mouth daily.   Yes Historical Provider, MD  potassium chloride SA (K-DUR,KLOR-CON) 20 MEQ tablet Take 20 mEq by mouth 2 (two) times daily.   Yes Historical Provider, MD  zolpidem (AMBIEN) 10 MG tablet Take 5 mg by mouth at bedtime.    Yes Historical Provider, MD  amphetamine-dextroamphetamine (ADDERALL XR) 20 MG 24 hr capsule Take 20 mg  by mouth daily.    Historical Provider, MD  benzonatate (TESSALON) 100 MG capsule Take 1 capsule (100 mg total) by mouth every 8 (eight) hours. 06/08/13   Charles B. Karle Starch, MD  traMADol (ULTRAM) 50 MG tablet Take 50 mg by mouth 2 (two) times daily.     Historical Provider, MD   BP 138/93  Pulse 84  Temp(Src) 98.1 F (36.7 C) (Oral)  Resp 20  Ht 5' (1.524 m)  Wt 137 lb (62.143 kg)  BMI 26.76 kg/m2  SpO2 99% Physical Exam  Nursing note and vitals reviewed.  43 year old female, resting comfortably and in no acute distress. Vital signs are significant for borderline hypertension. Oxygen saturation is 99%, which is normal. Head is normocephalic and atraumatic. PERRLA, EOMI. Oropharynx is clear. Neck is nontender and supple without adenopathy  or JVD. Back is nontender and there is no CVA tenderness. Lungs are clear without rales, wheezes, or rhonchi. Chest is nontender. Heart has regular rate and rhythm without murmur. Abdomen is soft, flat, nontender without masses or hepatosplenomegaly and peristalsis is normoactive. Extremities have no cyanosis or edema, full range of motion is present. There is mild tenderness to palpation of the left posterior iliac crest. Skin is warm and dry without rash. Neurologic: Mental status is normal, cranial nerves are intact, there are no motor or sensory deficits.  ED Course  Procedures (including critical care time) Labs Review Results for orders placed during the hospital encounter of 11/21/13  CBC WITH DIFFERENTIAL      Result Value Ref Range   WBC 14.2 (*) 4.0 - 10.5 K/uL   RBC 4.57  3.87 - 5.11 MIL/uL   Hemoglobin 14.3  12.0 - 15.0 g/dL   HCT 42.3  36.0 - 46.0 %   MCV 92.6  78.0 - 100.0 fL   MCH 31.3  26.0 - 34.0 pg   MCHC 33.8  30.0 - 36.0 g/dL   RDW 13.9  11.5 - 15.5 %   Platelets 349  150 - 400 K/uL   Neutrophils Relative % 57  43 - 77 %   Neutro Abs 8.2 (*) 1.7 - 7.7 K/uL   Lymphocytes Relative 31  12 - 46 %   Lymphs Abs 4.4 (*) 0.7 - 4.0 K/uL   Monocytes Relative 7  3 - 12 %   Monocytes Absolute 0.9  0.1 - 1.0 K/uL   Eosinophils Relative 4  0 - 5 %   Eosinophils Absolute 0.6  0.0 - 0.7 K/uL   Basophils Relative 1  0 - 1 %   Basophils Absolute 0.1  0.0 - 0.1 K/uL  COMPREHENSIVE METABOLIC PANEL      Result Value Ref Range   Sodium 142  137 - 147 mEq/L   Potassium 3.2 (*) 3.7 - 5.3 mEq/L   Chloride 105  96 - 112 mEq/L   CO2 24  19 - 32 mEq/L   Glucose, Bld 91  70 - 99 mg/dL   BUN 7  6 - 23 mg/dL   Creatinine, Ser 1.11 (*) 0.50 - 1.10 mg/dL   Calcium 9.5  8.4 - 10.5 mg/dL   Total Protein 7.4  6.0 - 8.3 g/dL   Albumin 3.7  3.5 - 5.2 g/dL   AST 12  0 - 37 U/L   ALT 15  0 - 35 U/L   Alkaline Phosphatase 108  39 - 117 U/L   Total Bilirubin 0.2 (*) 0.3 - 1.2 mg/dL   GFR  calc non Af  Amer 59 (*) >90 mL/min   GFR calc Af Amer 69 (*) >90 mL/min   Anion gap 13  5 - 15  LACTIC ACID, PLASMA      Result Value Ref Range   Lactic Acid, Venous 0.9  0.5 - 2.2 mmol/L  URINALYSIS, ROUTINE W REFLEX MICROSCOPIC      Result Value Ref Range   Color, Urine YELLOW  YELLOW   APPearance CLEAR  CLEAR   Specific Gravity, Urine 1.010  1.005 - 1.030   pH 7.0  5.0 - 8.0   Glucose, UA NEGATIVE  NEGATIVE mg/dL   Hgb urine dipstick NEGATIVE  NEGATIVE   Bilirubin Urine NEGATIVE  NEGATIVE   Ketones, ur NEGATIVE  NEGATIVE mg/dL   Protein, ur NEGATIVE  NEGATIVE mg/dL   Urobilinogen, UA 0.2  0.0 - 1.0 mg/dL   Nitrite NEGATIVE  NEGATIVE   Leukocytes, UA NEGATIVE  NEGATIVE  TROPONIN I      Result Value Ref Range   Troponin I <0.30  <0.30 ng/mL   Imaging Review Dg Pelvis 1-2 Views  11/21/2013   CLINICAL DATA:  Status post fall; left hip pain.  EXAM: PELVIS - 1-2 VIEW  COMPARISON:  None.  FINDINGS: There is no evidence of fracture or dislocation. Both femoral heads are seated normally within their respective acetabula. No significant degenerative change is appreciated. The sacroiliac joints are unremarkable in appearance.  The visualized bowel gas pattern is grossly unremarkable in appearance.  IMPRESSION: No evidence of fracture or dislocation.   Electronically Signed   By: Garald Balding M.D.   On: 11/21/2013 06:10    Images viewed by me.   MDM   Final diagnoses:  Fall at home, initial encounter  Contusion, buttock, initial encounter    Fall at term with apparent contusion of the left side of the pelvis. X-rays will be obtained. Report of hypotension at home which is not confirmed here. Orthostatic vital signs will be checked as well as lactic acid level. Her records are reviewed and she had admitted to the hospital with hypotension and encephalopathy without clear cause for either being found.  X-rays are unremarkable. Lactic acid level has come back normal. Orthostatic vital  signs show no abnormal drop in blood pressure or rise in pulse. I suspect that her home readings with hypotension was factitious since she shows no evidence of hypotension in the ED. He is complaining of ongoing pain and is requesting something for pain. I reviewed her records on the electronic controlled substance reporting website and she had a prescription for Tylenol with codeine #3 field on August 7. When asked about recent narcotic prescriptions, she states that she has not had any. She is actually had 8 separate narcotic prescriptions over the last 6 months. I'm concerned about this and will not prescribe any narcotics. She is given a prescription for naproxen.  Delora Fuel, MD 54/09/81 [id number]

## 2013-11-21 NOTE — ED Notes (Signed)
Pt states she has been feeling weak and dizzy at home, states she took her own blood pressure and it was 72/32  With a "low pulse"

## 2013-11-21 NOTE — ED Notes (Signed)
Patient sitting up on side of bed; states she can't take the pain.  Lab tech in to recollect blood.

## 2013-11-21 NOTE — Discharge Instructions (Signed)
Fall Prevention and Home Safety Falls cause injuries and can affect all age groups. It is possible to use preventive measures to significantly decrease the likelihood of falls. There are many simple measures which can make your home safer and prevent falls. OUTDOORS  Repair cracks and edges of walkways and driveways.  Remove high doorway thresholds.  Trim shrubbery on the main path into your home.  Have good outside lighting.  Clear walkways of tools, rocks, debris, and clutter.  Check that handrails are not broken and are securely fastened. Both sides of steps should have handrails.  Have leaves, snow, and ice cleared regularly.  Use sand or salt on walkways during winter months.  In the garage, clean up grease or oil spills. BATHROOM  Install night lights.  Install grab bars by the toilet and in the tub and shower.  Use non-skid mats or decals in the tub or shower.  Place a plastic non-slip stool in the shower to sit on, if needed.  Keep floors dry and clean up all water on the floor immediately.  Remove soap buildup in the tub or shower on a regular basis.  Secure bath mats with non-slip, double-sided rug tape.  Remove throw rugs and tripping hazards from the floors. BEDROOMS  Install night lights.  Make sure a bedside light is easy to reach.  Do not use oversized bedding.  Keep a telephone by your bedside.  Have a firm chair with side arms to use for getting dressed.  Remove throw rugs and tripping hazards from the floor. KITCHEN  Keep handles on pots and pans turned toward the center of the stove. Use back burners when possible.  Clean up spills quickly and allow time for drying.  Avoid walking on wet floors.  Avoid hot utensils and knives.  Position shelves so they are not too high or low.  Place commonly used objects within easy reach.  If necessary, use a sturdy step stool with a grab bar when reaching.  Keep electrical cables out of the  way.  Do not use floor polish or wax that makes floors slippery. If you must use wax, use non-skid floor wax.  Remove throw rugs and tripping hazards from the floor. STAIRWAYS  Never leave objects on stairs.  Place handrails on both sides of stairways and use them. Fix any loose handrails. Make sure handrails on both sides of the stairways are as long as the stairs.  Check carpeting to make sure it is firmly attached along stairs. Make repairs to worn or loose carpet promptly.  Avoid placing throw rugs at the top or bottom of stairways, or properly secure the rug with carpet tape to prevent slippage. Get rid of throw rugs, if possible.  Have an electrician put in a light switch at the top and bottom of the stairs. OTHER FALL PREVENTION TIPS  Wear low-heel or rubber-soled shoes that are supportive and fit well. Wear closed toe shoes.  When using a stepladder, make sure it is fully opened and both spreaders are firmly locked. Do not climb a closed stepladder.  Add color or contrast paint or tape to grab bars and handrails in your home. Place contrasting color strips on first and last steps.  Learn and use mobility aids as needed. Install an electrical emergency response system.  Turn on lights to avoid dark areas. Replace light bulbs that burn out immediately. Get light switches that glow.  Arrange furniture to create clear pathways. Keep furniture in the same place.  Firmly attach carpet with non-skid or double-sided tape.  Eliminate uneven floor surfaces.  Select a carpet pattern that does not visually hide the edge of steps.  Be aware of all pets. OTHER HOME SAFETY TIPS  Set the water temperature for 120 F (48.8 C).  Keep emergency numbers on or near the telephone.  Keep smoke detectors on every level of the home and near sleeping areas. Document Released: 03/17/2002 Document Revised: 09/26/2011 Document Reviewed: 06/16/2011 [facility] Patient Information 2015  [place], [place]. This information is not intended to replace advice given to you by your health care provider. Make sure you discuss any questions you have with your health care provider.   Contusion A contusion is a deep bruise. Contusions are the result of an injury that caused bleeding under the skin. The contusion may turn blue, purple, or yellow. Minor injuries will give you a painless contusion, but more severe contusions may stay painful and swollen for a few weeks.  CAUSES  A contusion is usually caused by a blow, trauma, or direct force to an area of the body. SYMPTOMS   Swelling and redness of the injured area.  Bruising of the injured area.  Tenderness and soreness of the injured area.  Pain. DIAGNOSIS  The diagnosis can be made by taking a history and physical exam. An X-ray, CT scan, or MRI may be needed to determine if there were any associated injuries, such as fractures. TREATMENT  Specific treatment will depend on what area of the body was injured. In general, the best treatment for a contusion is resting, icing, elevating, and applying cold compresses to the injured area. Over-the-counter medicines may also be recommended for pain control. Ask your caregiver what the best treatment is for your contusion. HOME CARE INSTRUCTIONS   Put ice on the injured area.  Put ice in a plastic bag.  Place a towel between your skin and the bag.  Leave the ice on for 15-20 minutes, 3-4 times a day, or as directed by your health care provider.  Only take over-the-counter or prescription medicines for pain, discomfort, or fever as directed by your caregiver. Your caregiver may recommend avoiding anti-inflammatory medicines (aspirin, ibuprofen, and naproxen) for 48 hours because these medicines may increase bruising.  Rest the injured area.  If possible, elevate the injured area to reduce swelling. SEEK IMMEDIATE MEDICAL CARE IF:   You have increased bruising or swelling.  You have  pain that is getting worse.  Your swelling or pain is not relieved with medicines. MAKE SURE YOU:   Understand these instructions.  Will watch your condition.  Will get help right away if you are not doing well or get worse. Document Released: 01/04/2005 Document Revised: 04/01/2013 Document Reviewed: 01/30/2011 [facility] Patient Information 2015 [place], [place]. This information is not intended to replace advice given to you by your health care provider. Make sure you discuss any questions you have with your health care provider.  Naproxen and naproxen sodium oral immediate-release tablets What is this medicine? NAPROXEN (na PROX en) is a non-steroidal anti-inflammatory drug (NSAID). It is used to reduce swelling and to treat pain. This medicine may be used for dental pain, headache, or painful monthly periods. It is also used for painful joint and muscular problems such as arthritis, tendinitis, bursitis, and gout. This medicine may be used for other purposes; ask your health care provider or pharmacist if you have questions. COMMON BRAND NAME(S): Aflaxen, Aleve, Aleve Arthritis, All Day Relief, Anaprox, Anaprox DS, Naprosyn  What should I tell my health care provider before I take this medicine? They need to know if you have any of these conditions: -asthma -cigarette smoker -drink more than 3 alcohol containing drinks a day -heart disease or circulation problems such as heart failure or leg edema (fluid retention) -high blood pressure -kidney disease -liver disease -stomach bleeding or ulcers -an unusual or allergic reaction to naproxen, aspirin, other NSAIDs, other medicines, foods, dyes, or preservatives -pregnant or trying to get pregnant -breast-feeding How should I use this medicine? Take this medicine by mouth with a glass of water. Follow the directions on the prescription label. Take it with food if your stomach gets upset. Try to not lie down for at least 10 minutes after  you take it. Take your medicine at regular intervals. Do not take your medicine more often than directed. Long-term, continuous use may increase the risk of heart attack or stroke. A special MedGuide will be given to you by the pharmacist with each prescription and refill. Be sure to read this information carefully each time. Talk to your pediatrician regarding the use of this medicine in children. Special care may be needed. Overdosage: If you think you have taken too much of this medicine contact a poison control center or emergency room at once. NOTE: This medicine is only for you. Do not share this medicine with others. What if I miss a dose? If you miss a dose, take it as soon as you can. If it is almost time for your next dose, take only that dose. Do not take double or extra doses. What may interact with this medicine? -alcohol -aspirin -cidofovir -diuretics -lithium -methotrexate -other drugs for inflammation like ketorolac or prednisone -pemetrexed -probenecid -warfarin This list may not describe all possible interactions. Give your health care provider a list of all the medicines, herbs, non-prescription drugs, or dietary supplements you use. Also tell them if you smoke, drink alcohol, or use illegal drugs. Some items may interact with your medicine. What should I watch for while using this medicine? Tell your doctor or health care professional if your pain does not get better. Talk to your doctor before taking another medicine for pain. Do not treat yourself. This medicine does not prevent heart attack or stroke. In fact, this medicine may increase the chance of a heart attack or stroke. The chance may increase with longer use of this medicine and in people who have heart disease. If you take aspirin to prevent heart attack or stroke, talk with your doctor or health care professional. Do not take other medicines that contain aspirin, ibuprofen, or naproxen with this medicine. Side  effects such as stomach upset, nausea, or ulcers may be more likely to occur. Many medicines available without a prescription should not be taken with this medicine. This medicine can cause ulcers and bleeding in the stomach and intestines at any time during treatment. Do not smoke cigarettes or drink alcohol. These increase irritation to your stomach and can make it more susceptible to damage from this medicine. Ulcers and bleeding can happen without warning symptoms and can cause death. You may get drowsy or dizzy. Do not drive, use machinery, or do anything that needs mental alertness until you know how this medicine affects you. Do not stand or sit up quickly, especially if you are an older patient. This reduces the risk of dizzy or fainting spells. This medicine can cause you to bleed more easily. Try to avoid damage to your teeth and  gums when you brush or floss your teeth. What side effects may I notice from receiving this medicine? Side effects that you should report to your doctor or health care professional as soon as possible: -black or bloody stools, blood in the urine or vomit -blurred vision -chest pain -difficulty breathing or wheezing -nausea or vomiting -severe stomach pain -skin rash, skin redness, blistering or peeling skin, hives, or itching -slurred speech or weakness on one side of the body -swelling of eyelids, throat, lips -unexplained weight gain or swelling -unusually weak or tired -yellowing of eyes or skin Side effects that usually do not require medical attention (report to your doctor or health care professional if they continue or are bothersome): -constipation -headache -heartburn This list may not describe all possible side effects. Call your doctor for medical advice about side effects. You may report side effects to FDA at 1-800-FDA-1088. Where should I keep my medicine? Keep out of the reach of children. Store at room temperature between 15 and 30 degrees  C (59 and 86 degrees F). Keep container tightly closed. Throw away any unused medicine after the expiration date. NOTE: This sheet is a summary. It may not cover all possible information. If you have questions about this medicine, talk to your doctor, pharmacist, or health care provider.  2015, [employer]. (2009-03-29 20:10:16)

## 2013-11-21 NOTE — ED Notes (Signed)
Patient c/o left hip pain rated 8/10.

## 2013-11-21 NOTE — ED Notes (Signed)
Discharge instructions given and reviewed with patient.  Prescription given for Naproxen; effects and use explained.  Patient verbalized understanding to take medication as prescribed and to follow up with PMD as needed.

## 2014-01-15 ENCOUNTER — Encounter: Payer: Self-pay | Admitting: Internal Medicine

## 2014-03-19 ENCOUNTER — Ambulatory Visit: Payer: Self-pay | Admitting: Internal Medicine

## 2015-02-24 ENCOUNTER — Emergency Department (HOSPITAL_COMMUNITY)
Admission: EM | Admit: 2015-02-24 | Discharge: 2015-02-25 | Disposition: A | Discharge status: Other Acute Inpatient Hospital | Payer: Medicaid Other | Attending: Emergency Medicine | Admitting: Emergency Medicine

## 2015-02-24 ENCOUNTER — Encounter (HOSPITAL_COMMUNITY): Payer: Self-pay | Admitting: *Deleted

## 2015-02-24 DIAGNOSIS — F329 Major depressive disorder, single episode, unspecified: Secondary | ICD-10-CM | POA: Insufficient documentation

## 2015-02-24 DIAGNOSIS — G8929 Other chronic pain: Secondary | ICD-10-CM | POA: Diagnosis not present

## 2015-02-24 DIAGNOSIS — F17210 Nicotine dependence, cigarettes, uncomplicated: Secondary | ICD-10-CM | POA: Diagnosis not present

## 2015-02-24 DIAGNOSIS — M1990 Unspecified osteoarthritis, unspecified site: Secondary | ICD-10-CM | POA: Insufficient documentation

## 2015-02-24 DIAGNOSIS — E876 Hypokalemia: Secondary | ICD-10-CM | POA: Insufficient documentation

## 2015-02-24 DIAGNOSIS — E039 Hypothyroidism, unspecified: Secondary | ICD-10-CM | POA: Diagnosis not present

## 2015-02-24 DIAGNOSIS — R000 Tachycardia, unspecified: Secondary | ICD-10-CM | POA: Insufficient documentation

## 2015-02-24 DIAGNOSIS — F32A Depression, unspecified: Secondary | ICD-10-CM

## 2015-02-24 DIAGNOSIS — I10 Essential (primary) hypertension: Secondary | ICD-10-CM | POA: Insufficient documentation

## 2015-02-24 DIAGNOSIS — F419 Anxiety disorder, unspecified: Secondary | ICD-10-CM | POA: Diagnosis not present

## 2015-02-24 LAB — I-STAT CHEM 8, ED
BUN: 5 mg/dL — AB (ref 6–20)
CHLORIDE: 106 mmol/L (ref 101–111)
Calcium, Ion: 1.3 mmol/L — ABNORMAL HIGH (ref 1.12–1.23)
Creatinine, Ser: 1 mg/dL (ref 0.44–1.00)
Glucose, Bld: 150 mg/dL — ABNORMAL HIGH (ref 65–99)
HEMATOCRIT: 49 % — AB (ref 36.0–46.0)
Hemoglobin: 16.7 g/dL — ABNORMAL HIGH (ref 12.0–15.0)
POTASSIUM: 2.6 mmol/L — AB (ref 3.5–5.1)
SODIUM: 143 mmol/L (ref 135–145)
TCO2: 21 mmol/L (ref 0–100)

## 2015-02-24 LAB — RAPID URINE DRUG SCREEN, HOSP PERFORMED
Amphetamines: NOT DETECTED
BENZODIAZEPINES: NOT DETECTED
Barbiturates: NOT DETECTED
COCAINE: NOT DETECTED
OPIATES: NOT DETECTED
TETRAHYDROCANNABINOL: NOT DETECTED

## 2015-02-24 LAB — CBC WITH DIFFERENTIAL/PLATELET
BASOS PCT: 1 %
Basophils Absolute: 0.1 10*3/uL (ref 0.0–0.1)
EOS PCT: 2 %
Eosinophils Absolute: 0.2 10*3/uL (ref 0.0–0.7)
HCT: 43.6 % (ref 36.0–46.0)
HEMOGLOBIN: 15.2 g/dL — AB (ref 12.0–15.0)
Lymphocytes Relative: 18 %
Lymphs Abs: 2.1 10*3/uL (ref 0.7–4.0)
MCH: 31.7 pg (ref 26.0–34.0)
MCHC: 34.9 g/dL (ref 30.0–36.0)
MCV: 91 fL (ref 78.0–100.0)
Monocytes Absolute: 0.4 10*3/uL (ref 0.1–1.0)
Monocytes Relative: 3 %
NEUTROS ABS: 8.5 10*3/uL — AB (ref 1.7–7.7)
Neutrophils Relative %: 76 %
Platelets: 354 10*3/uL (ref 150–400)
RBC: 4.79 MIL/uL (ref 3.87–5.11)
RDW: 14.3 % (ref 11.5–15.5)
WBC: 11.2 10*3/uL — ABNORMAL HIGH (ref 4.0–10.5)

## 2015-02-24 LAB — TSH: TSH: 38.217 u[IU]/mL — ABNORMAL HIGH (ref 0.350–4.500)

## 2015-02-24 LAB — T4, FREE: FREE T4: 0.45 ng/dL — AB (ref 0.61–1.12)

## 2015-02-24 LAB — ETHANOL: Alcohol, Ethyl (B): <5 mg/dL (ref <5)

## 2015-02-24 MED ORDER — LEVOTHYROXINE SODIUM 25 MCG PO TABS
25.0000 ug | ORAL_TABLET | Freq: Once | ORAL | Status: AC
  Administered 2015-02-24: 25 ug via ORAL
  Filled 2015-02-24: qty 1

## 2015-02-24 MED ORDER — LORAZEPAM 1 MG PO TABS
1.0000 mg | ORAL_TABLET | Freq: Once | ORAL | Status: AC
  Administered 2015-02-24: 1 mg via ORAL
  Filled 2015-02-24: qty 1

## 2015-02-24 MED ORDER — LEVOTHYROXINE SODIUM 25 MCG PO TABS: 25.0000 ug | ORAL_TABLET | Freq: Every day | ORAL | Status: DC

## 2015-02-24 MED ORDER — POTASSIUM CHLORIDE CRYS ER 20 MEQ PO TBCR
80.0000 meq | EXTENDED_RELEASE_TABLET | Freq: Once | ORAL | Status: AC
  Administered 2015-02-24: 80 meq via ORAL
  Filled 2015-02-24: qty 4

## 2015-02-24 MED ORDER — LORAZEPAM 1 MG PO TABS
2.0000 mg | ORAL_TABLET | Freq: Three times a day (TID) | ORAL | Status: DC | PRN
  Administered 2015-02-24 – 2015-02-25 (×2): 2 mg via ORAL
  Filled 2015-02-24 (×2): qty 2

## 2015-02-24 MED ORDER — LEVOTHYROXINE SODIUM 25 MCG PO TABS
25.0000 ug | ORAL_TABLET | Freq: Every day | ORAL | Status: DC
  Administered 2015-02-25: 25 ug via ORAL
  Filled 2015-02-24 (×2): qty 1

## 2015-02-24 MED ORDER — ACETAMINOPHEN 325 MG PO TABS
650.0000 mg | ORAL_TABLET | ORAL | Status: DC | PRN
  Administered 2015-02-24: 650 mg via ORAL
  Filled 2015-02-24: qty 2

## 2015-02-24 MED ORDER — LORAZEPAM 1 MG PO TABS
1.0000 mg | ORAL_TABLET | Freq: Once | ORAL | Status: AC
Start: 2015-02-24 — End: 2015-02-24
  Administered 2015-02-24: 1 mg via ORAL
  Filled 2015-02-24: qty 1

## 2015-02-24 NOTE — BH Assessment (Signed)
Per May, NP - patient will remain in the ED overnight and the patient will be re-assessed by an extender (NP) in the morning.

## 2015-02-24 NOTE — ED Notes (Signed)
TTS in process at this time  

## 2015-02-24 NOTE — ED Notes (Signed)
Pt wanded by security at this time  ?

## 2015-02-24 NOTE — ED Notes (Signed)
Pt changing into scrubs 

## 2015-02-24 NOTE — Care Management (Signed)
ED CM consulted by Dr. Maryan Rued concerning patient establishing  primary care.CM reviewed patient's record. CM met with patient, patient confirmed information. Patient has Medicaid she lives in [facility] she does not drive. Discussed [facility], patient is agreeable to establishing care. Provided patient with information, patient instructed to contact the office as soon as possible to schedule a follow up. No further ED CM needs identified.

## 2015-02-24 NOTE — ED Notes (Signed)
Pt presents via POV c/o depression and anxiety.  Pt reports being admitted for sepsis in June 2014 and came off her depression medications on her own 9 months ago. Reports feeling depressed since she was sick in 2014.  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  Pt reports insomnia, nightmares, hallucinations, denies hearing voices.  Pt denies SI/HI at this time, "didn't want it to get to that point".  Pt a x 4, anxious on arrival to room.

## 2015-02-24 NOTE — ED Provider Notes (Signed)
CSN: [id number]     Arrival date & time 02/24/15  [id number] History   First MD Initiated Contact with Patient 02/24/15 0857     Chief Complaint  Patient presents with  . Depression     (Consider location/radiation/quality/duration/timing/severity/associated sxs/prior Treatment) HPI Comments: Patient is a 44 year old female with a history of anxiety and depression who has not been on any medication for that over the last 2 years which was last time she saw her psychiatrist presents today with worsening anxiety and depression. She states that things have been chronic for years but worsened in the last 2 months now having visual hallucinations where she seeing things crawling on her bed that aren't there. She states 2 months ago her best friend got married and left as well as her ex-boyfriend committing suicide in February. Since these events things are getting worse. She denies any suicidal ideations, attempts or homicidal thoughts. She currently takes no medications but does have a history of thyroid disease.  She came today because she is speaking with her family member who is a Social worker who recommended that she be evaluated.  Patient is a 44 y.o. female presenting with depression. The history is provided by the patient.  Depression This is a chronic problem. Episode onset: worsening over the last 2 months. The problem occurs constantly. The problem has been gradually worsening. Pertinent negatives include no chest pain and no abdominal pain. Associated symptoms comments: Visual hallucinations, insomnia, nightmares, anxiety. The symptoms are aggravated by stress. Nothing relieves the symptoms.    Past Medical History  Diagnosis Date  . Thyroid disease     Hypothyroidism  . Goiter   . Depression   . Hypertension   . Fibromyalgia   . Arthritis   . Elevated WBC count     unknown cause  . Anxiety   . Memory loss since 03/2012  . Chronic pain   . ADD (attention deficit disorder)   . Tobacco  abuse    Past Surgical History  Procedure Laterality Date  . Cesarean section  1995, 2001  . Partial hysterectomy  2009  . Abdominal hysterectomy    . Appendectomy    . Abdominal surgery    . Tonsillectomy  1998   Family History  Problem Relation Age of Onset  . Hypertension Other     Parent   Social History  Substance Use Topics  . Smoking status: Current Every Day Smoker -- 1.00 packs/day for 10 years    Types: Cigarettes  . Smokeless tobacco: Never Used  . Alcohol Use: No   OB History    No data available     Review of Systems  HENT:       Has a history of equator and occasionally will feel thickness in her throat some difficulty swallowing but not constant  Cardiovascular: Negative for chest pain.  Gastrointestinal: Negative for abdominal pain.  Psychiatric/Behavioral: Positive for depression.  All other systems reviewed and are negative.     Allergies  Hct and Lyrica  Home Medications   Prior to Admission medications   Medication Sig Start Date End Date Taking? Authorizing Provider  diphenhydrAMINE (SOMINEX) 25 MG tablet Take 25 mg by mouth at bedtime as needed for sleep.   Yes Historical Provider, MD  ibuprofen (ADVIL,MOTRIN) 200 MG tablet Take 200 mg by mouth every 6 (six) hours as needed for moderate pain.   Yes Historical Provider, MD  benzonatate (TESSALON) 100 MG capsule Take 1 capsule (100 mg total) by mouth  every 8 (eight) hours. Patient not taking: Reported on 02/24/2015 06/08/13   Calvert Cantor, MD  naproxen (NAPROSYN) 500 MG tablet Take 1 tablet (500 mg total) by mouth 2 (two) times daily. Patient not taking: Reported on 02/24/2015 [id number]   Delora Fuel, MD   BP 123456 mmHg  Pulse 103  Temp(Src) 97.8 F (36.6 C) (Oral)  Resp 12  SpO2 98% Physical Exam  Constitutional: She is oriented to person, place, and time. She appears well-developed and well-nourished. No distress.  HENT:  Head: Normocephalic and atraumatic.  Mouth/Throat: Oropharynx  is clear and moist.  Eyes: Conjunctivae and EOM are normal. Pupils are equal, round, and reactive to light.  Neck: Normal range of motion. Neck supple. Thyromegaly present.  Cardiovascular: Regular rhythm and intact distal pulses.  Tachycardia present.   No murmur heard. Pulmonary/Chest: Effort normal and breath sounds normal. No respiratory distress. She has no wheezes. She has no rales.  Abdominal: Soft. She exhibits no distension. There is no tenderness. There is no rebound and no guarding.  Musculoskeletal: Normal range of motion. She exhibits no edema or tenderness.  Neurological: She is alert and oriented to person, place, and time.  Skin: Skin is warm and dry. No rash noted. No erythema.  Psychiatric: Her speech is normal and behavior is normal. Her mood appears anxious. Thought content is not paranoid. She exhibits a depressed mood. She expresses no homicidal and no suicidal ideation. She expresses no homicidal plans.  Nursing note and vitals reviewed.   ED Course  Procedures (including critical care time) Labs Review Labs Reviewed  CBC WITH DIFFERENTIAL/PLATELET - Abnormal; Notable for the following:    WBC 11.2 (*)    Hemoglobin 15.2 (*)    Neutro Abs 8.5 (*)    All other components within normal limits  TSH - Abnormal; Notable for the following:    TSH 38.217 (*)    All other components within normal limits  I-STAT CHEM 8, ED - Abnormal; Notable for the following:    Potassium 2.6 (*)    BUN 5 (*)    Glucose, Bld 150 (*)    Calcium, Ion 1.30 (*)    Hemoglobin 16.7 (*)    HCT 49.0 (*)    All other components within normal limits  ETHANOL  URINE RAPID DRUG SCREEN, HOSP PERFORMED  T4, FREE  T3    Imaging Review No results found. I have personally reviewed and evaluated these images and lab results as part of my medical decision-making.   EKG Interpretation None      MDM   Final diagnoses:  None    Patient is a 44 year old female presenting today with  anxiety and depression now resulting in hallucinations and inability to sleep. She has not seen a doctor in several years and has not been on any psychiatric medications and a proximally 2 years. She denies any suicidal thoughts or homicidal thoughts. She does smoke cigarettes but denies any alcohol or substance use. She currently takes no medications. She has a history of thyroid disease but takes nothing for it at this time. She denies any infectious symptoms.  Her medical exam is unremarkable except for mild tachycardia. She denies any chest pain or shortness of breath. Labs show a normal hemoglobin mildly elevated white count of 11 and a hypokalemia of 2.6. Will replace her potassium and patient given Ativan. TTS to evaluate. TSH still pending.  11:35 AM Patient's TSH is elevated at 38.  T3 and T4 sent.  However  will start pt on 76mcg of synthroid and pt will need f/u with PCP in 4-6 weeks for recheck and dose adjustment.  This may be adding into her symptoms but probably not solely responsible.   Blanchie Dessert, MD 02/24/15 1154

## 2015-02-24 NOTE — ED Notes (Signed)
MD at bedside. 
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
## 2015-02-25 ENCOUNTER — Observation Stay (HOSPITAL_COMMUNITY)
Admission: AD | Admit: 2015-02-25 | Discharge: 2015-02-26 | Disposition: A | Discharge status: Home / Self Care | Payer: Medicaid Other | Source: Intra-hospital | Attending: Psychiatry | Admitting: Psychiatry

## 2015-02-25 ENCOUNTER — Encounter (HOSPITAL_COMMUNITY): Payer: Self-pay

## 2015-02-25 DIAGNOSIS — F329 Major depressive disorder, single episode, unspecified: Secondary | ICD-10-CM | POA: Diagnosis not present

## 2015-02-25 DIAGNOSIS — F17210 Nicotine dependence, cigarettes, uncomplicated: Secondary | ICD-10-CM | POA: Insufficient documentation

## 2015-02-25 DIAGNOSIS — F419 Anxiety disorder, unspecified: Secondary | ICD-10-CM | POA: Diagnosis not present

## 2015-02-25 DIAGNOSIS — R441 Visual hallucinations: Secondary | ICD-10-CM | POA: Diagnosis not present

## 2015-02-25 DIAGNOSIS — G8929 Other chronic pain: Secondary | ICD-10-CM | POA: Diagnosis not present

## 2015-02-25 DIAGNOSIS — I10 Essential (primary) hypertension: Secondary | ICD-10-CM | POA: Insufficient documentation

## 2015-02-25 DIAGNOSIS — M797 Fibromyalgia: Secondary | ICD-10-CM | POA: Diagnosis not present

## 2015-02-25 DIAGNOSIS — Z888 Allergy status to other drugs, medicaments and biological substances status: Secondary | ICD-10-CM | POA: Insufficient documentation

## 2015-02-25 DIAGNOSIS — M1990 Unspecified osteoarthritis, unspecified site: Secondary | ICD-10-CM | POA: Diagnosis not present

## 2015-02-25 DIAGNOSIS — Z79899 Other long term (current) drug therapy: Secondary | ICD-10-CM | POA: Diagnosis not present

## 2015-02-25 DIAGNOSIS — E039 Hypothyroidism, unspecified: Secondary | ICD-10-CM | POA: Insufficient documentation

## 2015-02-25 DIAGNOSIS — F339 Major depressive disorder, recurrent, unspecified: Secondary | ICD-10-CM

## 2015-02-25 LAB — I-STAT CHEM 8, ED
BUN: 5 mg/dL — ABNORMAL LOW (ref 6–20)
CHLORIDE: 105 mmol/L (ref 101–111)
Calcium, Ion: 1.22 mmol/L (ref 1.12–1.23)
Creatinine, Ser: 1 mg/dL (ref 0.44–1.00)
GLUCOSE: 159 mg/dL — AB (ref 65–99)
HEMATOCRIT: 43 % (ref 36.0–46.0)
HEMOGLOBIN: 14.6 g/dL (ref 12.0–15.0)
POTASSIUM: 3.1 mmol/L — AB (ref 3.5–5.1)
Sodium: 143 mmol/L (ref 135–145)
TCO2: 23 mmol/L (ref 0–100)

## 2015-02-25 LAB — T3: T3 TOTAL: 130 ng/dL (ref 71–180)

## 2015-02-25 MED ORDER — MAGNESIUM HYDROXIDE 400 MG/5ML PO SUSP: 30.0000 mL | Freq: Every day | ORAL | Status: DC | PRN

## 2015-02-25 MED ORDER — BUPROPION HCL ER (XL) 300 MG PO TB24: 300.0000 mg | ORAL_TABLET | Freq: Every day | ORAL | Status: DC

## 2015-02-25 MED ORDER — LISINOPRIL 20 MG PO TABS
20.0000 mg | ORAL_TABLET | Freq: Every day | ORAL | Status: DC
  Administered 2015-02-25 – 2015-02-26 (×2): 20 mg via ORAL
  Filled 2015-02-25 (×2): qty 1

## 2015-02-25 MED ORDER — POTASSIUM CHLORIDE CRYS ER 20 MEQ PO TBCR: 40.0000 meq | EXTENDED_RELEASE_TABLET | Freq: Once | ORAL | Status: DC

## 2015-02-25 MED ORDER — MIRTAZAPINE 15 MG PO TABS
7.5000 mg | ORAL_TABLET | Freq: Every day | ORAL | Status: DC
  Administered 2015-02-25: 7.5 mg via ORAL
  Filled 2015-02-25: qty 1

## 2015-02-25 MED ORDER — HYDROXYZINE HCL 50 MG PO TABS
50.0000 mg | ORAL_TABLET | Freq: Three times a day (TID) | ORAL | Status: DC | PRN
  Administered 2015-02-25 – 2015-02-26 (×2): 50 mg via ORAL
  Filled 2015-02-25 (×2): qty 1

## 2015-02-25 MED ORDER — GABAPENTIN 300 MG PO CAPS
300.0000 mg | ORAL_CAPSULE | Freq: Three times a day (TID) | ORAL | Status: DC
  Administered 2015-02-25 – 2015-02-26 (×2): 300 mg via ORAL
  Filled 2015-02-25 (×2): qty 1

## 2015-02-25 MED ORDER — BUPROPION HCL ER (XL) 150 MG PO TB24
150.0000 mg | ORAL_TABLET | Freq: Every day | ORAL | Status: DC
  Administered 2015-02-25 – 2015-02-26 (×2): 150 mg via ORAL
  Filled 2015-02-25 (×2): qty 1

## 2015-02-25 MED ORDER — ACETAMINOPHEN 325 MG PO TABS: 650.0000 mg | ORAL_TABLET | Freq: Four times a day (QID) | ORAL | Status: DC | PRN

## 2015-02-25 MED ORDER — POTASSIUM CHLORIDE CRYS ER 10 MEQ PO TBCR
10.0000 meq | EXTENDED_RELEASE_TABLET | Freq: Two times a day (BID) | ORAL | Status: DC
  Administered 2015-02-25 – 2015-02-26 (×2): 10 meq via ORAL
  Filled 2015-02-25 (×4): qty 1

## 2015-02-25 MED ORDER — ALUM & MAG HYDROXIDE-SIMETH 200-200-20 MG/5ML PO SUSP: 30.0000 mL | ORAL | Status: DC | PRN

## 2015-02-25 NOTE — Progress Notes (Signed)
Nursing Admission Note:  Patient is Voluntary Admission to Observation Unit from [facility] with Diagnosis of MDD. Patient denies any current SI/HI or AVH, states she ran out of her medications 9 months ago and has not taken any; states she was "let go" by her Primary Caregiver and then her Psychiatrist and she was too embarrassed to try to find another one. Patient has multiple maladies and had been on Synthroid in the past, also has HTN and has significant intolerance to Lyrica and Hctz and cannot take them. Nurse providing information regarding Unit, completing Admission Assessment, providing therapeutic communication and emotional support as well as ensuring Q15 minute checks for safety continuous. Patient remains safe on Unit and verbally contracts for safety.

## 2015-02-25 NOTE — ED Provider Notes (Signed)
Patient is stable for transfer for psychiatric treatment. Hypokalemia was treated by Dr. Maryan Rued last evening. Patient will need outpatient follow-up of her hypothyroidism.  Dorie Rank, MD 02/25/15 1006

## 2015-02-25 NOTE — ED Notes (Signed)
Arbie Cookey RN at [facility] called and update on I-Stat results.  [facility] called for transport.

## 2015-02-25 NOTE — ED Notes (Signed)
Patient was given a sprite to drink, Regular diet order taken for lunch.

## 2015-02-25 NOTE — Progress Notes (Signed)
D: Patient in bed on approach.  Patient states she is depressed but sleepy.  Patient states she has been trying to rest.  Patient does not engage in conversation with Probation officer.  Patient denies SI/HI and denies AVH.   A: Staff to monitor Q 15 mins for safety.  Encouragement and support offered.  Scheduled medications administered per orders. R: Patient remains safe on the unit.  Patient taking administered medications.

## 2015-02-25 NOTE — H&P (Signed)
OBS Admission Assessment Adult  Patient Identification: Sharon Atkins MRN:  [id number] Date of Evaluation:  02/25/2015 Chief Complaint:  MDD Principal Diagnosis: MDD (major depressive disorder) ([place]) Diagnosis:   Patient Active Problem List   Diagnosis Date Noted  . MDD (major depressive disorder) ([place]) [F32.9] 02/25/2015    Priority: High  . Rhabdomyolysis [M62.82] 05/18/2013  . Altered mental status [R41.82] 05/17/2013  . ADD (attention deficit disorder) [F90.9] 05/17/2013  . Hypokalemia [E87.6] 05/17/2013  . Hyponatremia [E87.1] 05/17/2013  . Transaminitis [R74.0] 05/17/2013  . Tobacco abuse [Z72.0] 05/17/2013  . Muscle jerks during sleep [G25.3] 05/17/2013  . Drug-drug interaction [T50.905A] 05/17/2013  . SIRS (systemic inflammatory response syndrome) ([facility]) [R65.10] 09/16/2012  . Sepsis ([place]) [A41.9] 09/16/2012  . Junctional bradycardia [R00.1] 09/16/2012  . AKI (acute kidney injury) ([place]) [N17.9] 09/16/2012  . Acute encephalopathy [G93.40] 09/16/2012  . Right anterior knee pain [M25.561] 02/14/2012  . Unspecified hypothyroidism [E03.9] 12/31/2011  . Depression [F32.9]   . Hypertension [I10]   . Fibromyalgia [M79.7]    History of Present Illness: Sharon Atkins, a 44 year old female with a history of anxiety and depression.  She states that she was last on meds a few months ago and she also saw her Psychiatrist in [place] at that same time.  She states that she has had a couple of crisis triggers in the past.  She discovered her ex BF committed suicide and she lost contact with her best friend.  As earlier reported, she stated that things have been chronic for years but worsened in the last 2 months now having visual hallucinations where she seeing things crawling on her bed that aren't there.  She lives with her mother and 72 year old son.    Associated Signs/Symptoms: Depression Symptoms:  depressed mood, anxiety, (Hypo) Manic Symptoms:  Labiality of  Mood, Anxiety Symptoms:  Excessive Worry, Psychotic Symptoms:  NA PTSD Symptoms: NA Total Time spent with patient: 45 minutes  Past Psychiatric History: depression  Risk to Self: Is patient at risk for suicide?: No Risk to Others:   Prior Inpatient Therapy:   Prior Outpatient Therapy:    Alcohol Screening: 1. How often do you have a drink containing alcohol?: Never 9. Have you or someone else been injured as a result of your drinking?: No 10. Has a relative or friend or a doctor or another health worker been concerned about your drinking or suggested you cut down?: No Alcohol Use Disorder Identification Test Final Score (AUDIT): 0 Brief Intervention: AUDIT score less than 7 or less-screening does not suggest unhealthy drinking-brief intervention not indicated Substance Abuse History in the last 12 months:  Yes.   Consequences of Substance Abuse: NA Previous Psychotropic Medications: Yes  Psychological Evaluations: Yes  Past Medical History:  Past Medical History  Diagnosis Date  . Thyroid disease     Hypothyroidism  . Goiter   . Depression   . Hypertension   . Fibromyalgia   . Arthritis   . Elevated WBC count     unknown cause  . Anxiety   . Memory loss since 03/2012  . Chronic pain   . ADD (attention deficit disorder)   . Tobacco abuse     Past Surgical History  Procedure Laterality Date  . Cesarean section  1995, 2001  . Partial hysterectomy  2009  . Abdominal hysterectomy    . Appendectomy    . Abdominal surgery    . Tonsillectomy  1998   Family History:  Family History  Problem Relation Age of Onset  . Hypertension Other     Parent   Family Psychiatric  History: denies Social History:  History  Alcohol Use No     History  Drug Use No    Social History   Social History  . Marital Status: Divorced    Spouse Name: N/A  . Number of Children: 2  . Years of Education: 16   Occupational History  . Unemployed    Social History Main Topics  .  Smoking status: Current Every Day Smoker -- 1.00 packs/day for 10 years    Types: Cigarettes  . Smokeless tobacco: Never Used  . Alcohol Use: No  . Drug Use: No  . Sexual Activity: Yes   Other Topics Concern  . None   Social History Narrative   Regular exercise-yes   Caffeine Use-yes, 3-16oz daily   Patient lives at home with son.         Additional Social History:      Allergies:   Allergies  Allergen Reactions  . Hct [Hydrochlorothiazide] Other (See Comments)    "affects potassium more than normal"  . Lyrica [Pregabalin] Other (See Comments)    "makes her feel stupid"   Lab Results:  Results for orders placed or performed during the hospital encounter of 02/24/15 (from the past 48 hour(s))  Urine rapid drug screen (hosp performed)     Status: None   Collection Time: 02/24/15  9:37 AM  Result Value Ref Range   Opiates NONE DETECTED NONE DETECTED   Cocaine NONE DETECTED NONE DETECTED   Benzodiazepines NONE DETECTED NONE DETECTED   Amphetamines NONE DETECTED NONE DETECTED   Tetrahydrocannabinol NONE DETECTED NONE DETECTED   Barbiturates NONE DETECTED NONE DETECTED    Comment:        DRUG SCREEN FOR MEDICAL PURPOSES ONLY.  IF CONFIRMATION IS NEEDED FOR ANY PURPOSE, NOTIFY LAB WITHIN 5 DAYS.        LOWEST DETECTABLE LIMITS FOR URINE DRUG SCREEN Drug Class       Cutoff (ng/mL) Amphetamine      1000 Barbiturate      200 Benzodiazepine   [id number] Tricyclics       [id number] Opiates          300 Cocaine          300 THC              50   CBC with Differential/Platelet     Status: Abnormal   Collection Time: 02/24/15  9:45 AM  Result Value Ref Range   WBC 11.2 (H) 4.0 - 10.5 K/uL   RBC 4.79 3.87 - 5.11 MIL/uL   Hemoglobin 15.2 (H) 12.0 - 15.0 g/dL   HCT 43.6 36.0 - 46.0 %   MCV 91.0 78.0 - 100.0 fL   MCH 31.7 26.0 - 34.0 pg   MCHC 34.9 30.0 - 36.0 g/dL   RDW 14.3 11.5 - 15.5 %   Platelets 354 150 - 400 K/uL   Neutrophils Relative % 76 %   Neutro Abs 8.5 (H) 1.7 - 7.7  K/uL   Lymphocytes Relative 18 %   Lymphs Abs 2.1 0.7 - 4.0 K/uL   Monocytes Relative 3 %   Monocytes Absolute 0.4 0.1 - 1.0 K/uL   Eosinophils Relative 2 %   Eosinophils Absolute 0.2 0.0 - 0.7 K/uL   Basophils Relative 1 %   Basophils Absolute 0.1 0.0 - 0.1 K/uL  Ethanol     Status: None  Collection Time: 02/24/15  9:45 AM  Result Value Ref Range   Alcohol, Ethyl (B) <5 <5 mg/dL    Comment:        LOWEST DETECTABLE LIMIT FOR SERUM ALCOHOL IS 5 mg/dL FOR MEDICAL PURPOSES ONLY   TSH     Status: Abnormal   Collection Time: 02/24/15  9:45 AM  Result Value Ref Range   TSH 38.217 (H) 0.350 - 4.500 uIU/mL  T4, free     Status: Abnormal   Collection Time: 02/24/15  9:45 AM  Result Value Ref Range   Free T4 0.45 (L) 0.61 - 1.12 ng/dL  T3     Status: None   Collection Time: 02/24/15  9:45 AM  Result Value Ref Range   T3, Total 130 71 - 180 ng/dL    Comment: (NOTE) Performed At: [facility] [place], [place] [id number] Lindon Romp MD [id number]   I-stat chem 8, ed     Status: Abnormal   Collection Time: 02/24/15  9:51 AM  Result Value Ref Range   Sodium 143 135 - 145 mmol/L   Potassium 2.6 (LL) 3.5 - 5.1 mmol/L   Chloride 106 101 - 111 mmol/L   BUN 5 (L) 6 - 20 mg/dL   Creatinine, Ser 1.00 0.44 - 1.00 mg/dL   Glucose, Bld 150 (H) 65 - 99 mg/dL   Calcium, Ion 1.30 (H) 1.12 - 1.23 mmol/L   TCO2 21 0 - 100 mmol/L   Hemoglobin 16.7 (H) 12.0 - 15.0 g/dL   HCT 49.0 (H) 36.0 - 46.0 %  I-Stat Chem 8, ED     Status: Abnormal   Collection Time: 02/25/15 10:29 AM  Result Value Ref Range   Sodium 143 135 - 145 mmol/L   Potassium 3.1 (L) 3.5 - 5.1 mmol/L   Chloride 105 101 - 111 mmol/L   BUN 5 (L) 6 - 20 mg/dL   Creatinine, Ser 1.00 0.44 - 1.00 mg/dL   Glucose, Bld 159 (H) 65 - 99 mg/dL   Calcium, Ion 1.22 1.12 - 1.23 mmol/L   TCO2 23 0 - 100 mmol/L   Hemoglobin 14.6 12.0 - 15.0 g/dL   HCT 43.0 36.0 - [id number] %    Metabolic Disorder Labs:  No  results found for: HGBA1C, MPG No results found for: PROLACTIN No results found for: CHOL, TRIG, HDL, CHOLHDL, VLDL, LDLCALC  Current Medications: Current Facility-Administered Medications  Medication Dose Route Frequency Provider Last Rate Last Dose  . acetaminophen (TYLENOL) tablet 650 mg  650 mg Oral Q6H PRN Kerrie Buffalo, NP      . alum & mag hydroxide-simeth (MAALOX/MYLANTA) 200-200-20 MG/5ML suspension 30 mL  30 mL Oral Q4H PRN Kerrie Buffalo, NP      . buPROPion (WELLBUTRIN XL) 24 hr tablet 150 mg  150 mg Oral Daily Kerrie Buffalo, NP   150 mg at 02/25/15 1521  . gabapentin (NEURONTIN) capsule 300 mg  300 mg Oral TID Kerrie Buffalo, NP      . hydrOXYzine (ATARAX/VISTARIL) tablet 50 mg  50 mg Oral TID PRN Kerrie Buffalo, NP   50 mg at 02/25/15 1522  . lisinopril (PRINIVIL,ZESTRIL) tablet 20 mg  20 mg Oral Daily Kerrie Buffalo, NP   20 mg at 02/25/15 1521  . magnesium hydroxide (MILK OF MAGNESIA) suspension 30 mL  30 mL Oral Daily PRN Kerrie Buffalo, NP      . mirtazapine (REMERON) tablet 7.5 mg  7.5 mg Oral QHS Kerrie Buffalo, NP      .  potassium chloride (K-DUR,KLOR-CON) CR tablet 10 mEq  10 mEq Oral BID Kerrie Buffalo, NP       PTA Medications: Prescriptions prior to admission  Medication Sig Dispense Refill Last Dose  . diphenhydrAMINE (SOMINEX) 25 MG tablet Take 25 mg by mouth at bedtime as needed for sleep.   02/23/2015 at Unknown time  . ibuprofen (ADVIL,MOTRIN) 200 MG tablet Take 200 mg by mouth every 6 (six) hours as needed for moderate pain.   02/24/2015 at Unknown time  . benzonatate (TESSALON) 100 MG capsule Take 1 capsule (100 mg total) by mouth every 8 (eight) hours. (Patient not taking: Reported on 02/24/2015) 21 capsule 0 Completed Course at Unknown time  . naproxen (NAPROSYN) 500 MG tablet Take 1 tablet (500 mg total) by mouth 2 (two) times daily. (Patient not taking: Reported on 02/24/2015) 30 tablet 0 Not Taking at Unknown time    Musculoskeletal: Strength & Muscle  Tone: within normal limits Gait & Station: normal Patient leans: N/A  Psychiatric Specialty Exam: Physical Exam  Vitals reviewed.   Review of Systems  All other systems reviewed and are negative.   Blood pressure 127/88, pulse 105, temperature 98.4 F (36.9 C), temperature source Oral, resp. rate 24, height 5' (1.524 m), weight 69.741 kg (153 lb 12 oz), SpO2 99 %.Body mass index is 30.03 kg/(m^2).  General Appearance: Neat  Eye Contact::  Good  Speech:  Clear and Coherent  Volume:  Normal  Mood:  Anxious and Euthymic  Affect:  Congruent  Thought Process:  Circumstantial  Orientation:  Full (Time, Place, and Person)  Thought Content:  Rumination  Suicidal Thoughts:  No  Homicidal Thoughts:  No  Memory:  Immediate;   Fair Recent;   Fair Remote;   Fair  Judgement:  Fair  Insight:  Fair  Psychomotor Activity:  Normal  Concentration:  Fair  Recall:  Good  Fund of Knowledge:Fair  Language: Fair  Akathisia:  Negative  Handed:  Right  AIMS (if indicated):     Assets:  Resilience  ADL's:  Intact  Cognition: WNL  Sleep:  poor   Treatment Plan Summary: Daily contact with patient to assess and evaluate symptoms and progress in treatment and Medication management  Plan to dc in the morning if mood improves.  Please consider giving her Rx 3 day supply of her Wellbutrin, Hydroxyzine, Gabapentin, Lisinopril (her previous regimen, verified by this NP with [facility]) Her K+ level is 3.1, 4 doses of 10 mEq started.  Observation Level/Precautions:  Continuous Observation  Laboratory:  per ED  Psychotherapy:  OBS counselor  Medications:  As per medlist  Consultations:  As needed  Discharge Concerns:  safety  Estimated LOS:  OBS  Other:     I certify that OBS unit services furnished can reasonably be expected to improve the patient's condition.    Freda Munro May Darothy Courtright AGNP-BC 11/17/20163:44 PM

## 2015-02-25 NOTE — BHH Counselor (Signed)
[place] Assessment Progress Note  Pt requests assistance with psychiatry and therapy to help mitigate her increasing depression and anxiety. Pt lives in [place]. Appt made with [facility] for 03/02/15 @9am . Pt made aware and is okay with being d/c in the AM.   Sharon Atkins. Lovena Le, [place], [place], [facility] Counselor

## 2015-02-26 DIAGNOSIS — F331 Major depressive disorder, recurrent, moderate: Secondary | ICD-10-CM

## 2015-02-26 DIAGNOSIS — F329 Major depressive disorder, single episode, unspecified: Secondary | ICD-10-CM | POA: Diagnosis not present

## 2015-02-26 MED ORDER — MIRTAZAPINE 7.5 MG PO TABS: 7.5000 mg | ORAL_TABLET | Freq: Every day | ORAL | Status: DC

## 2015-02-26 MED ORDER — LISINOPRIL 20 MG PO TABS: 20.0000 mg | ORAL_TABLET | Freq: Every day | ORAL | Status: DC

## 2015-02-26 MED ORDER — BUPROPION HCL ER (XL) 150 MG PO TB24: 150.0000 mg | ORAL_TABLET | Freq: Every day | ORAL | Status: DC

## 2015-02-26 MED ORDER — GABAPENTIN 300 MG PO CAPS: 300.0000 mg | ORAL_CAPSULE | Freq: Three times a day (TID) | ORAL | Status: DC

## 2015-02-26 NOTE — Discharge Summary (Signed)
[facility] Unit Discharge Summary Note  Patient:  Sharon Atkins is an 44 y.o., female MRN:  [id number] DOB:  1970/09/26 Patient phone:  [phone] (home)  Patient address:   [address] [place] [address],  Total Time spent with patient: 30 minutes  Date of Admission:  02/25/2015 Date of Discharge: 02/26/2015  Reason for Admission:  Depressive symptoms  Principal Problem: MDD (major depressive disorder) [facility]) Discharge Diagnoses: Patient Active Problem List   Diagnosis Date Noted  . MDD (major depressive disorder) ([place]) [F32.9] 02/25/2015  . Rhabdomyolysis [M62.82] 05/18/2013  . Altered mental status [R41.82] 05/17/2013  . ADD (attention deficit disorder) [F90.9] 05/17/2013  . Hypokalemia [E87.6] 05/17/2013  . Hyponatremia [E87.1] 05/17/2013  . Transaminitis [R74.0] 05/17/2013  . Tobacco abuse [Z72.0] 05/17/2013  . Muscle jerks during sleep [G25.3] 05/17/2013  . Drug-drug interaction [T50.905A] 05/17/2013  . SIRS (systemic inflammatory response syndrome) ([facility]) [R65.10] 09/16/2012  . Sepsis ([place]) [A41.9] 09/16/2012  . Junctional bradycardia [R00.1] 09/16/2012  . AKI (acute kidney injury) ([place]) [N17.9] 09/16/2012  . Acute encephalopathy [G93.40] 09/16/2012  . Right anterior knee pain [M25.561] 02/14/2012  . Unspecified hypothyroidism [E03.9] 12/31/2011  . Depression [F32.9]   . Hypertension [I10]   . Fibromyalgia [M79.7]     Past Psychiatric History: MDD  Past Medical History:  Past Medical History  Diagnosis Date  . Thyroid disease     Hypothyroidism  . Goiter   . Depression   . Hypertension   . Fibromyalgia   . Arthritis   . Elevated WBC count     unknown cause  . Anxiety   . Memory loss since 03/2012  . Chronic pain   . ADD (attention deficit disorder)   . Tobacco abuse     Past Surgical History  Procedure Laterality Date  . Cesarean section  1995, 2001  . Partial hysterectomy  2009  . Abdominal hysterectomy    . Appendectomy    .  Abdominal surgery    . Tonsillectomy  1998   Family History:  Family History  Problem Relation Age of Onset  . Hypertension Other     Parent   Social History:  History  Alcohol Use No     History  Drug Use No    Social History   Social History  . Marital Status: Divorced    Spouse Name: N/A  . Number of Children: 2  . Years of Education: 16   Occupational History  . Unemployed    Social History Main Topics  . Smoking status: Current Every Day Smoker -- 1.00 packs/day for 10 years    Types: Cigarettes  . Smokeless tobacco: Never Used  . Alcohol Use: No  . Drug Use: No  . Sexual Activity: Yes   Other Topics Concern  . None   Social History Narrative   Regular exercise-yes   Caffeine Use-yes, 3-16oz daily   Patient lives at home with son.          Hospital Course:   Sharon Atkins, a 44 year old female with a history of anxiety and depression. She states that she was last on meds a few months ago and she also saw her Psychiatrist in [place] at that same time. She states that she has had a couple of crisis triggers in the past. She discovered her ex BF committed suicide and she lost contact with her best friend. As earlier reported, she stated that things have been chronic for years but worsened in the last 2 months now  having visual hallucinations where she seeing things crawling on her bed that aren't there. She lives with her mother and 30 year old son.   Patient was admitted to the [facility] unit and after assessment was started on her previous psychiatric medications. Yesterday on 02/25/2015 an appointment was made for her to follow up at [facility] in [place] for medication management and therapy. Patient while in the Observation unit was started on Remeron, Wellbutrin XL, Neurontin, and Lisinopril. She also reported intent to establish with a Primary Care Provider in [place]. Sharon Atkins reported improvement in her mood and denied suicidal thoughts.  She did not appear to be experiencing psychotic symptoms during assessment on 02/26/2015 and also denied this. Sharon Atkins appeared motivated to follow up on an outpatient basis and requests prescriptions for her medications to last until she would be next seen. Notes from the Observation unit staff also indicate the patient did not express suicidal ideation and was found suitable for discharge with outpatient resources in place. Patient left [place] in stable condition with all belongings returned to her at time of discharge.   Physical Findings: AIMS: Facial and Oral Movements Muscles of Facial Expression: None, normal Lips and Perioral Area: None, normal Jaw: None, normal Tongue: None, normal,Extremity Movements Upper (arms, wrists, hands, fingers): None, normal Lower (legs, knees, ankles, toes): None, normal, Trunk Movements Neck, shoulders, hips: None, normal, Overall Severity Severity of abnormal movements (highest score from questions above): None, normal Incapacitation due to abnormal movements: None, normal Patient's awareness of abnormal movements (rate only patient's report): No Awareness, Dental Status Current problems with teeth and/or dentures?: No Does patient usually wear dentures?: No  CIWA:  CIWA-Ar Total: 0 COWS:  COWS Total Score: 2  Musculoskeletal: Strength & Muscle Tone: within normal limits Gait & Station: normal Patient leans: N/A  Psychiatric Specialty Exam: Review of Systems  Constitutional: Negative.   HENT: Negative.   Eyes: Negative.   Respiratory: Negative.   Cardiovascular: Negative.   Gastrointestinal: Negative.   Genitourinary: Negative.   Musculoskeletal: Negative.   Skin: Negative.   Neurological: Negative.   Endo/Heme/Allergies: Negative.   Psychiatric/Behavioral: Positive for depression (Stable with current medication regimen). Negative for suicidal ideas, hallucinations, memory loss and substance abuse. The patient is not nervous/anxious and does  not have insomnia.     Blood pressure 104/66, pulse 81, temperature 98.4 F (36.9 C), temperature source Oral, resp. rate 18, height 5' (1.524 m), weight 69.741 kg (153 lb 12 oz), SpO2 97 %.Body mass index is 30.03 kg/(m^2).  General Appearance: Casual  Eye Contact::  Good  Speech:  Clear and Coherent  Volume:  Normal  Mood:  Euthymic  Affect:  Appropriate  Thought Process:  Goal Directed and Intact  Orientation:  Full (Time, Place, and Person)  Thought Content:  WDL  Suicidal Thoughts:  No  Homicidal Thoughts:  No  Memory:  Immediate;   Good Recent;   Good Remote;   Good  Judgement:  Fair  Insight:  Present  Psychomotor Activity:  Normal  Concentration:  Good  Recall:  Good  Fund of Knowledge:Good  Language: Good  Akathisia:  No  Handed:  Right  AIMS (if indicated):     Assets:  Communication Skills Desire for Improvement Housing Leisure Time Physical Health Resilience Social Support  ADL's:  Intact  Cognition: WNL  Sleep:      Have you used any form of tobacco in the last 30 days? (Cigarettes, Smokeless Tobacco, Cigars, and/or Pipes): Yes  Has this patient used  any form of tobacco in the last 30 days? (Cigarettes, Smokeless Tobacco, Cigars, and/or Pipes) Yes, No  Metabolic Disorder Labs:  No results found for: HGBA1C, MPG No results found for: PROLACTIN No results found for: CHOL, TRIG, HDL, CHOLHDL, VLDL, LDLCALC  Discharge destination:  Home  Is patient on multiple antipsychotic therapies at discharge:  No   Has Patient had three or more failed trials of antipsychotic monotherapy by history:  No  Recommended Plan for Multiple Antipsychotic Therapies: NA      Discharge Instructions    Discharge instructions    Complete by:  As directed   Please establish care with a Primary Physician in [place] as discussed to follow up on low thyroid levels as recommended by ED Provider as soon as possible. Also for further refills on medications such as Lisinopril,  which is used to treat elevated blood pressure.            Medication List    STOP taking these medications        benzonatate 100 MG capsule  Commonly known as:  TESSALON     diphenhydrAMINE 25 MG tablet  Commonly known as:  SOMINEX     naproxen 500 MG tablet  Commonly known as:  NAPROSYN      TAKE these medications      Indication   buPROPion 150 MG 24 hr tablet  Commonly known as:  WELLBUTRIN XL  Take 1 tablet (150 mg total) by mouth daily.   Indication:  Major Depressive Disorder     gabapentin 300 MG capsule  Commonly known as:  NEURONTIN  Take 1 capsule (300 mg total) by mouth 3 (three) times daily.   Indication:  Agitation     ibuprofen 200 MG tablet  Commonly known as:  ADVIL,MOTRIN  Take 200 mg by mouth every 6 (six) hours as needed for moderate pain.      lisinopril 20 MG tablet  Commonly known as:  PRINIVIL,ZESTRIL  Take 1 tablet (20 mg total) by mouth daily.   Indication:  High Blood Pressure     mirtazapine 7.5 MG tablet  Commonly known as:  REMERON  Take 1 tablet (7.5 mg total) by mouth at bedtime.   Indication:  Trouble Sleeping, Major Depressive Disorder       Follow-up Information    Follow up with [facility]. Go on 03/02/2015.   Why:  NEW PATIENT APPT. Take copy of your social security card, Medicaid card, & proof of income, if any.   Contact information:   [address], [place] [address] [phone]        Follow-up recommendations:    As above  Comments:   Take all your medications as prescribed by your mental healthcare provider.  Report any adverse effects and or reactions from your medicines to your outpatient provider promptly.  Patient is instructed and cautioned to not engage in alcohol and or illegal drug use while on prescription medicines.  In the event of worsening symptoms, patient is instructed to call the crisis hotline, 911 and or go to the nearest ED for appropriate evaluation and treatment of  symptoms.  Follow-up with your primary care provider for your other medical issues, concerns and or health care needs.   SignedElmarie Shiley, NP-C 02/26/2015, 5:51 PM  I agree with assessment and plan Geralyn Flash A. Sabra Heck, M.D.

## 2015-02-26 NOTE — Progress Notes (Signed)
D: Patient resting in bed with eyes closed.  Respirations even and unlabored.  Patient appears to be in no apparent distress. A: Staff to monitor Q 15 mins for safety.   R:Patient remains safe on the unit.  

## 2015-02-26 NOTE — Progress Notes (Signed)
Patient ID: Sharon Atkins, female   DOB: Jul 15, 1970, 44 y.o.   MRN: [id number] Patient discharged per MD orders. Patient given education regarding follow-up appointments and medications. Patient denies any questions or concerns about these instructions. Patient was escorted to locker and given belongings before discharge to hospital lobby. Patient currently denies SI/HI and auditory and visual hallucinations on discharge.

## 2015-06-16 ENCOUNTER — Encounter (HOSPITAL_COMMUNITY): Payer: Self-pay | Admitting: Emergency Medicine

## 2015-06-16 ENCOUNTER — Observation Stay (HOSPITAL_COMMUNITY)
Admission: EM | Admit: 2015-06-16 | Discharge: 2015-06-17 | Disposition: A | Discharge status: Home / Self Care | Payer: Medicaid Other | Attending: Internal Medicine | Admitting: Internal Medicine

## 2015-06-16 DIAGNOSIS — F32A Depression, unspecified: Secondary | ICD-10-CM | POA: Diagnosis present

## 2015-06-16 DIAGNOSIS — R5383 Other fatigue: Secondary | ICD-10-CM | POA: Diagnosis present

## 2015-06-16 DIAGNOSIS — N390 Urinary tract infection, site not specified: Secondary | ICD-10-CM | POA: Diagnosis not present

## 2015-06-16 DIAGNOSIS — E039 Hypothyroidism, unspecified: Secondary | ICD-10-CM | POA: Diagnosis not present

## 2015-06-16 DIAGNOSIS — M1990 Unspecified osteoarthritis, unspecified site: Secondary | ICD-10-CM | POA: Diagnosis not present

## 2015-06-16 DIAGNOSIS — F329 Major depressive disorder, single episode, unspecified: Secondary | ICD-10-CM | POA: Insufficient documentation

## 2015-06-16 DIAGNOSIS — F17210 Nicotine dependence, cigarettes, uncomplicated: Secondary | ICD-10-CM | POA: Diagnosis not present

## 2015-06-16 DIAGNOSIS — F988 Other specified behavioral and emotional disorders with onset usually occurring in childhood and adolescence: Secondary | ICD-10-CM | POA: Diagnosis present

## 2015-06-16 DIAGNOSIS — R4182 Altered mental status, unspecified: Secondary | ICD-10-CM | POA: Diagnosis not present

## 2015-06-16 DIAGNOSIS — I10 Essential (primary) hypertension: Secondary | ICD-10-CM | POA: Diagnosis not present

## 2015-06-16 DIAGNOSIS — Z720 Tobacco use: Secondary | ICD-10-CM | POA: Diagnosis present

## 2015-06-16 DIAGNOSIS — M797 Fibromyalgia: Secondary | ICD-10-CM | POA: Diagnosis present

## 2015-06-16 HISTORY — DX: Hypothyroidism, unspecified: E03.9

## 2015-06-16 NOTE — ED Notes (Signed)
Pt c/o weakness and poor po intake x one week.

## 2015-06-16 NOTE — ED Provider Notes (Signed)
CSN: [id number]     Arrival date & time 06/16/15  2206 History  By signing my name below, I, Sharon Atkins, attest that this documentation has been prepared under the direction and in the presence of Delora Fuel, MD. Electronically Signed: Doran Atkins, ED Scribe. 06/17/2015. 12:21 AM.   Chief Complaint  Patient presents with  . Fatigue   The history is provided by the patient and a relative. No language interpreter was used.   HPI Comments: Sharon Atkins is a 45 y.o. female with a PMHx of HTN who presents to the Emergency Department complaining of generalized weakness for the past one week. Pt states that she "does not feel right and is out of it". Pt took ibuprofen with relief. Pt denies fevers, chills, cough, congestion, sore throat, CP, SOB, abdominal pain, nausea, vomiting, diarrhea, constipation, urinary symptoms or any other symptoms at this time.   Mother reports that the pt has a problem with her potassium levels. She states it decreases below normal every so often. Mother reports the pts son tried to wake her up but "could not".   Past Medical History  Diagnosis Date  . Thyroid disease     Hypothyroidism  . Goiter   . Depression   . Hypertension   . Fibromyalgia   . Arthritis   . Elevated WBC count     unknown cause  . Anxiety   . Memory loss since 03/2012  . Chronic pain   . ADD (attention deficit disorder)   . Tobacco abuse    Past Surgical History  Procedure Laterality Date  . Cesarean section  1995, 2001  . Partial hysterectomy  2009  . Abdominal hysterectomy    . Appendectomy    . Abdominal surgery    . Tonsillectomy  1998   Family History  Problem Relation Age of Onset  . Hypertension Other     Parent   Social History  Substance Use Topics  . Smoking status: Current Every Day Smoker -- 0.50 packs/day for 10 years    Types: Cigarettes  . Smokeless tobacco: Never Used  . Alcohol Use: No   OB History    No data available     Review of Systems   Constitutional: Negative for fever and chills.  HENT: Negative for congestion and rhinorrhea.   Eyes: Negative for redness and visual disturbance.  Respiratory: Negative for shortness of breath and wheezing.   Cardiovascular: Negative for chest pain and palpitations.  Gastrointestinal: Negative for nausea and vomiting.  Genitourinary: Negative for dysuria and urgency.  Musculoskeletal: Negative for myalgias and arthralgias.  Skin: Negative for pallor and wound.  Neurological: Positive for weakness. Negative for dizziness and headaches.  All other systems reviewed and are negative.   Allergies  Hct and Lyrica  Home Medications   Prior to Admission medications   Medication Sig Start Date End Date Taking? Authorizing Provider  buPROPion (WELLBUTRIN XL) 150 MG 24 hr tablet Take 1 tablet (150 mg total) by mouth daily. 02/26/15   Niel Hummer, NP  gabapentin (NEURONTIN) 300 MG capsule Take 1 capsule (300 mg total) by mouth 3 (three) times daily. 02/26/15   Niel Hummer, NP  ibuprofen (ADVIL,MOTRIN) 200 MG tablet Take 200 mg by mouth every 6 (six) hours as needed for moderate pain.    Historical Provider, MD  lisinopril (PRINIVIL,ZESTRIL) 20 MG tablet Take 1 tablet (20 mg total) by mouth daily. 02/26/15   Niel Hummer, NP  mirtazapine (REMERON) 7.5 MG  tablet Take 1 tablet (7.5 mg total) by mouth at bedtime. 02/26/15   Niel Hummer, NP   BP 132/92 mmHg  Pulse 97  Temp(Src) 98.1 F (36.7 C) (Oral)  Resp 18  Ht 5' (1.524 m)  Wt 153 lb (69.4 kg)  BMI 29.88 kg/m2  SpO2 97%   Physical Exam  Constitutional: She is oriented to person, place, and time. She appears well-developed and well-nourished.  HENT:  Head: Normocephalic and atraumatic.  Eyes: EOM are normal. Pupils are equal, round, and reactive to light.  Neck: Normal range of motion. Neck supple. No JVD present.  Cardiovascular: Normal rate, regular rhythm and normal heart sounds.   No murmur heard. Pulmonary/Chest: Effort  normal and breath sounds normal. She has no wheezes. She has no rales. She exhibits no tenderness.  Abdominal: Soft. Bowel sounds are normal. She exhibits no distension and no mass. There is no tenderness.  Musculoskeletal: Normal range of motion. She exhibits no edema.  Lymphadenopathy:    She has no cervical adenopathy.  Neurological: She is alert and oriented to person, place, and time. No cranial nerve deficit. She exhibits normal muscle tone. Coordination normal.  Sleepy but easily arousable; normal speech; no motor or sensory deficit.   Skin: Skin is warm and dry. No rash noted.  Nursing note and vitals reviewed.   ED Course  Procedures   DIAGNOSTIC STUDIES: Oxygen Saturation is 97% on room air, normal by my interpretation.    COORDINATION OF CARE: 12:13 AM Will give fluids. Will order CXR, pregnancy screen, blood work and urinalysis. Discussed treatment plan with pt at bedside and pt agreed to plan.   Labs Review Results for orders placed or performed during the hospital encounter of 06/16/15  Comprehensive metabolic panel  Result Value Ref Range   Sodium 143 135 - 145 mmol/L   Potassium 3.6 3.5 - 5.1 mmol/L   Chloride 112 (H) 101 - 111 mmol/L   CO2 20 (L) 22 - 32 mmol/L   Glucose, Bld 107 (H) 65 - 99 mg/dL   BUN 8 6 - 20 mg/dL   Creatinine, Ser 0.70 0.44 - 1.00 mg/dL   Calcium 8.7 (L) 8.9 - 10.3 mg/dL   Total Protein 6.4 (L) 6.5 - 8.1 g/dL   Albumin 3.5 3.5 - 5.0 g/dL   AST 25 15 - 41 U/L   ALT 24 14 - 54 U/L   Alkaline Phosphatase 66 38 - 126 U/L   Total Bilirubin 0.8 0.3 - 1.2 mg/dL   GFR calc non Af Amer >60 >60 mL/min   GFR calc Af Amer >60 >60 mL/min   Anion gap 11 5 - 15  CBC with Differential  Result Value Ref Range   WBC 13.1 (H) 4.0 - 10.5 K/uL   RBC 4.01 3.87 - 5.11 MIL/uL   Hemoglobin 13.0 12.0 - 15.0 g/dL   HCT 36.9 36.0 - 46.0 %   MCV 92.0 78.0 - 100.0 fL   MCH 32.4 26.0 - 34.0 pg   MCHC 35.2 30.0 - 36.0 g/dL   RDW 14.6 11.5 - 15.5 %    Platelets 257 150 - 400 K/uL   Neutrophils Relative % 63 %   Neutro Abs 8.2 (H) 1.7 - 7.7 K/uL   Lymphocytes Relative 30 %   Lymphs Abs 3.9 0.7 - 4.0 K/uL   Monocytes Relative 5 %   Monocytes Absolute 0.6 0.1 - 1.0 K/uL   Eosinophils Relative 2 %   Eosinophils Absolute 0.3 0.0 - 0.7  K/uL   Basophils Relative 0 %   Basophils Absolute 0.1 0.0 - 0.1 K/uL   Smear Review PLATELETS APPEAR ADEQUATE   Urinalysis, Routine w reflex microscopic  Result Value Ref Range   Color, Urine YELLOW YELLOW   APPearance HAZY (A) CLEAR   Specific Gravity, Urine <1.005 (L) 1.005 - 1.030   pH 6.5 5.0 - 8.0   Glucose, UA NEGATIVE NEGATIVE mg/dL   Hgb urine dipstick SMALL (A) NEGATIVE   Bilirubin Urine NEGATIVE NEGATIVE   Ketones, ur NEGATIVE NEGATIVE mg/dL   Protein, ur TRACE (A) NEGATIVE mg/dL   Nitrite NEGATIVE NEGATIVE   Leukocytes, UA LARGE (A) NEGATIVE  Urine microscopic-add on  Result Value Ref Range   Squamous Epithelial / LPF 6-30 (A) NONE SEEN   WBC, UA TOO NUMEROUS TO COUNT 0 - 5 WBC/hpf   RBC / HPF 6-30 0 - 5 RBC/hpf   Bacteria, UA MANY (A) NONE SEEN  POC urine preg, ED  Result Value Ref Range   Preg Test, Ur NEGATIVE NEGATIVE   Imaging Review Dg Chest 2 View  06/17/2015  CLINICAL DATA:  Weakness, decreased oral intake, and confusion for 1 week. Difficulty to arouse. EXAM: CHEST  2 VIEW COMPARISON:  05/17/2013 FINDINGS: The heart size and mediastinal contours are within normal limits. Both lungs are clear. The visualized skeletal structures are unremarkable. IMPRESSION: No active cardiopulmonary disease. Electronically Signed   By: Lucienne Capers M.D.   On: 06/17/2015 01:15   I have personally reviewed and evaluated these images and lab results as part of my medical decision-making.  MDM   Final diagnoses:  Urinary tract infection without hematuria, site unspecified  Altered mental status, unspecified altered mental status type    Altered mental status. Old records are reviewed and  she has prior ED visits for elective right disturbances including hyponatremia and hypokalemia. Family member states that she has had altered mentation with those presentations as well. She is started on IV hydration and workup is initiated. Electrolytes are actually normal today. WBC is mildly elevated but without left shift. Chest x-ray shows no evidence of pneumonia. Urinalysis shows clear evidence of urinary tract infection with too numerous to count WBCs and many bacteria. This was on a catheterized specimen. This is felt to be the cause of her altered mentation. She was not febrile and did not have tachycardia or hypotension, so she does not fit sepsis criteria. She was given IV hydration and started on ceftriaxone. Case is discussed with Dr. Darrick Meigs of [facility] who agrees to admit the patient.  I personally performed the services described in this documentation, which was scribed in my presence. The recorded information has been reviewed and is accurate.      Delora Fuel, MD [id number]

## 2015-06-17 ENCOUNTER — Encounter (HOSPITAL_COMMUNITY): Payer: Self-pay | Admitting: *Deleted

## 2015-06-17 ENCOUNTER — Emergency Department (HOSPITAL_COMMUNITY): Payer: Medicaid Other

## 2015-06-17 DIAGNOSIS — N390 Urinary tract infection, site not specified: Principal | ICD-10-CM | POA: Diagnosis present

## 2015-06-17 DIAGNOSIS — M797 Fibromyalgia: Secondary | ICD-10-CM

## 2015-06-17 DIAGNOSIS — Z720 Tobacco use: Secondary | ICD-10-CM

## 2015-06-17 DIAGNOSIS — N3000 Acute cystitis without hematuria: Secondary | ICD-10-CM | POA: Diagnosis not present

## 2015-06-17 DIAGNOSIS — F331 Major depressive disorder, recurrent, moderate: Secondary | ICD-10-CM

## 2015-06-17 DIAGNOSIS — E039 Hypothyroidism, unspecified: Secondary | ICD-10-CM

## 2015-06-17 DIAGNOSIS — F329 Major depressive disorder, single episode, unspecified: Secondary | ICD-10-CM

## 2015-06-17 HISTORY — DX: Hypothyroidism, unspecified: E03.9

## 2015-06-17 LAB — CBC WITH DIFFERENTIAL/PLATELET
Basophils Absolute: 0.1 10*3/uL (ref 0.0–0.1)
Basophils Relative: 0 %
Eosinophils Absolute: 0.3 10*3/uL (ref 0.0–0.7)
Eosinophils Relative: 2 %
HEMATOCRIT: 36.9 % (ref 36.0–46.0)
HEMOGLOBIN: 13 g/dL (ref 12.0–15.0)
LYMPHS ABS: 3.9 10*3/uL (ref 0.7–4.0)
LYMPHS PCT: 30 %
MCH: 32.4 pg (ref 26.0–34.0)
MCHC: 35.2 g/dL (ref 30.0–36.0)
MCV: 92 fL (ref 78.0–100.0)
Monocytes Absolute: 0.6 10*3/uL (ref 0.1–1.0)
Monocytes Relative: 5 %
NEUTROS PCT: 63 %
Neutro Abs: 8.2 10*3/uL — ABNORMAL HIGH (ref 1.7–7.7)
Platelets: 257 10*3/uL (ref 150–400)
RBC: 4.01 MIL/uL (ref 3.87–5.11)
RDW: 14.6 % (ref 11.5–15.5)
SMEAR REVIEW: ADEQUATE
WBC: 13.1 10*3/uL — AB (ref 4.0–10.5)

## 2015-06-17 LAB — COMPREHENSIVE METABOLIC PANEL
ALT: 24 U/L (ref 14–54)
AST: 25 U/L (ref 15–41)
Albumin: 3.5 g/dL (ref 3.5–5.0)
Alkaline Phosphatase: 66 U/L (ref 38–126)
Anion gap: 11 (ref 5–15)
BUN: 8 mg/dL (ref 6–20)
CHLORIDE: 112 mmol/L — AB (ref 101–111)
CO2: 20 mmol/L — AB (ref 22–32)
Calcium: 8.7 mg/dL — ABNORMAL LOW (ref 8.9–10.3)
Creatinine, Ser: 0.7 mg/dL (ref 0.44–1.00)
Glucose, Bld: 107 mg/dL — ABNORMAL HIGH (ref 65–99)
POTASSIUM: 3.6 mmol/L (ref 3.5–5.1)
SODIUM: 143 mmol/L (ref 135–145)
Total Bilirubin: 0.8 mg/dL (ref 0.3–1.2)
Total Protein: 6.4 g/dL — ABNORMAL LOW (ref 6.5–8.1)

## 2015-06-17 LAB — URINALYSIS, ROUTINE W REFLEX MICROSCOPIC
Bilirubin Urine: NEGATIVE
GLUCOSE, UA: NEGATIVE mg/dL
Ketones, ur: NEGATIVE mg/dL
Nitrite: NEGATIVE
PH: 6.5 (ref 5.0–8.0)
Specific Gravity, Urine: <1.005 — ABNORMAL LOW (ref 1.005–1.030)

## 2015-06-17 LAB — TSH: TSH: >90 u[IU]/mL — ABNORMAL HIGH (ref 0.350–4.500)

## 2015-06-17 LAB — T4, FREE: Free T4: 0.29 ng/dL — ABNORMAL LOW (ref 0.61–1.12)

## 2015-06-17 LAB — URINE MICROSCOPIC-ADD ON

## 2015-06-17 LAB — POC URINE PREG, ED: Preg Test, Ur: NEGATIVE

## 2015-06-17 MED ORDER — MIRTAZAPINE 15 MG PO TABS: 7.5000 mg | ORAL_TABLET | Freq: Every day | ORAL | Status: DC

## 2015-06-17 MED ORDER — SODIUM CHLORIDE 0.9 % IV SOLN
1000.0000 mL | Freq: Once | INTRAVENOUS | Status: AC
  Administered 2015-06-17: 1000 mL via INTRAVENOUS

## 2015-06-17 MED ORDER — SODIUM CHLORIDE 0.9 % IV SOLN
1000.0000 mL | INTRAVENOUS | Status: DC
  Administered 2015-06-17: 1000 mL via INTRAVENOUS

## 2015-06-17 MED ORDER — DEXTROSE 5 % IV SOLN
1.0000 g | Freq: Once | INTRAVENOUS | Status: AC
  Administered 2015-06-17: 1 g via INTRAVENOUS
  Filled 2015-06-17: qty 10

## 2015-06-17 MED ORDER — ACETAMINOPHEN 650 MG RE SUPP: 650.0000 mg | Freq: Four times a day (QID) | RECTAL | Status: DC | PRN

## 2015-06-17 MED ORDER — ONDANSETRON HCL 4 MG/2ML IJ SOLN: 4.0000 mg | Freq: Four times a day (QID) | INTRAMUSCULAR | Status: DC | PRN

## 2015-06-17 MED ORDER — SODIUM CHLORIDE 0.9 % IV SOLN
INTRAVENOUS | Status: DC
Start: 2015-06-17 — End: 2015-06-17
  Administered 2015-06-17: 06:00:00 via INTRAVENOUS

## 2015-06-17 MED ORDER — INFLUENZA VAC SPLIT QUAD 0.5 ML IM SUSY: 0.5000 mL | PREFILLED_SYRINGE | INTRAMUSCULAR | Status: DC

## 2015-06-17 MED ORDER — ENOXAPARIN SODIUM 40 MG/0.4ML ~~LOC~~ SOLN: 40.0000 mg | SUBCUTANEOUS | Status: DC

## 2015-06-17 MED ORDER — LEVOTHYROXINE SODIUM 25 MCG PO TABS
25.0000 ug | ORAL_TABLET | Freq: Every day | ORAL | Status: DC
  Administered 2015-06-17: 25 ug via ORAL
  Filled 2015-06-17: qty 1

## 2015-06-17 MED ORDER — LISINOPRIL 10 MG PO TABS
20.0000 mg | ORAL_TABLET | Freq: Every day | ORAL | Status: DC
  Administered 2015-06-17: 20 mg via ORAL
  Filled 2015-06-17: qty 2

## 2015-06-17 MED ORDER — BUPROPION HCL ER (XL) 150 MG PO TB24
150.0000 mg | ORAL_TABLET | Freq: Every day | ORAL | Status: DC
  Filled 2015-06-17 (×2): qty 1

## 2015-06-17 MED ORDER — ONDANSETRON HCL 4 MG PO TABS: 4.0000 mg | ORAL_TABLET | Freq: Four times a day (QID) | ORAL | Status: DC | PRN

## 2015-06-17 MED ORDER — LEVOTHYROXINE SODIUM 25 MCG PO TABS: 25.0000 ug | ORAL_TABLET | Freq: Every day | ORAL | Status: DC

## 2015-06-17 MED ORDER — PNEUMOCOCCAL VAC POLYVALENT 25 MCG/0.5ML IJ INJ: 0.5000 mL | INJECTION | INTRAMUSCULAR | Status: DC

## 2015-06-17 MED ORDER — ACETAMINOPHEN 325 MG PO TABS: 650.0000 mg | ORAL_TABLET | Freq: Four times a day (QID) | ORAL | Status: DC | PRN

## 2015-06-17 MED ORDER — GABAPENTIN 300 MG PO CAPS
300.0000 mg | ORAL_CAPSULE | Freq: Three times a day (TID) | ORAL | Status: DC
  Administered 2015-06-17: 300 mg via ORAL
  Filled 2015-06-17: qty 1

## 2015-06-17 MED ORDER — NITROFURANTOIN MONOHYD MACRO 100 MG PO CAPS: 100.0000 mg | ORAL_CAPSULE | Freq: Two times a day (BID) | ORAL | Status: DC

## 2015-06-17 MED ORDER — DEXTROSE 5 % IV SOLN
1.0000 g | INTRAVENOUS | Status: DC
  Filled 2015-06-17: qty 10

## 2015-06-17 NOTE — ED Notes (Signed)
Pt. Ambulated to the restroom

## 2015-06-17 NOTE — Progress Notes (Signed)
Asked pt if she wanted her flu shot before she left and she refused.

## 2015-06-17 NOTE — Progress Notes (Signed)
Pt discharged with IV removed and intact. Pt left with mother via wheelchair with all belongings and discharged papers.

## 2015-06-17 NOTE — Discharge Summary (Signed)
Physician Discharge Summary  Sharon Atkins [id number] DOB: December 02, 1970 DOA: 06/16/2015  PCP: Sharon Dials, PA-C  Admit date: 06/16/2015 Discharge date: 06/17/2015   Recommendations for Outpatient Follow-Up:   1. Appropriate follow up will be arranged with a PCP by the case manager. 2. PCP: Please check TSH/Free T4 in 4 weeks and adjust synthroid. TSH elevated 3 months ago, not on synthroid.  Repeat TSH/free T4 and urine culture pending.   Discharge Diagnosis:   Principal Problem:    Hypothyroidism Active Problems:    Depression    Fibromyalgia    ADD (attention deficit disorder)    Tobacco abuse    MDD (major depressive disorder) (HCC)    UTI (lower urinary tract infection)   Discharge disposition:  Home.  Discharge Condition: Stable.  Diet recommendation: Regular.   History of Present Illness:   Sharon Atkins is an 45 y.o. female with a PMH of fibromyalgia/chronic pain, ADD, tobacco abuse, anxiety, depression, and thyroid disease who was admitted 06/16/15 with a chief complaint of fatigue. Urinalysis showed too numerous to count WBCs.  Hospital Course by Problem:   Principal Problem:   Fatigue in the setting of Hypothyroidism - No significant lab abnormalities other than findings consistent with UTI on urinalysis. - Chest x-ray clear. - A review of the patient's chart reveals she had a TSH of 38.217 back on 02/24/15. Suspect untreated hypothyroidism. - Start Synthroid and recheck TSH/free T4.  Active Problems:  UTI (lower urinary tract infection) - Patient did not have any complaints of dysuria, but placed on empiric Rocephin given U/A findings and complaints of fatigue. D/C home on 3 days of Macrobid. - Follow-up urine cultures.   Depression/anxiety/fibromyalgia - Continue Wellbutrin, Neurontin and Remeron.   ADD (attention deficit disorder)   Tobacco abuse - Tobacco cessation counseling provided.   Hypertension - Controlled on  lisinopril.   Medical Consultants:    None.   Discharge Exam:   Filed Vitals:   06/17/15 0430 06/17/15 0610  BP: 109/72 93/67  Pulse: 68 64  Temp:  97.4 F (36.3 C)  Resp:  14   Filed Vitals:   06/17/15 0327 06/17/15 0330 06/17/15 0430 06/17/15 0610  BP: 134/82 115/80 109/72 93/67  Pulse: 78 73 68 64  Temp:    97.4 F (36.3 C)  TempSrc:    Oral  Resp: 18   14  Height:      Weight:    71.804 kg (158 lb 4.8 oz)  SpO2: 96% 95% 95% 94%    Gen:  Sleepy Cardiovascular:  RRR, No M/R/G Respiratory: Lungs CTAB Gastrointestinal: Abdomen soft, NT/ND with normal active bowel sounds. Extremities: No C/E/C   The results of significant diagnostics from this hospitalization (including imaging, microbiology, ancillary and laboratory) are listed below for reference.     Procedures and Diagnostic Studies:   Dg Chest 2 View  06/17/2015  CLINICAL DATA:  Weakness, decreased oral intake, and confusion for 1 week. Difficulty to arouse. EXAM: CHEST  2 VIEW COMPARISON:  05/17/2013 FINDINGS: The heart size and mediastinal contours are within normal limits. Both lungs are clear. The visualized skeletal structures are unremarkable. IMPRESSION: No active cardiopulmonary disease. Electronically Signed   By: Lucienne Capers M.D.   On: 06/17/2015 01:15     Labs:   Basic Metabolic Panel:  Recent Labs Lab 06/17/15 0239  NA 143  K 3.6  CL 112*  CO2 20*  GLUCOSE 107*  BUN 8  CREATININE 0.70  CALCIUM 8.7*  GFR Estimated Creatinine Clearance: 78.5 mL/min (by C-G formula based on Cr of 0.7). Liver Function Tests:  Recent Labs Lab 06/17/15 0239  AST 25  ALT 24  ALKPHOS 66  BILITOT 0.8  PROT 6.4*  ALBUMIN 3.5   CBC:  Recent Labs Lab 06/17/15 0239  WBC 13.1*  NEUTROABS 8.2*  HGB 13.0  HCT 36.9  MCV 92.0  PLT 257    Discharge Instructions:       Discharge Instructions    Activity as tolerated - No restrictions    Complete by:  As directed      Call MD for:   extreme fatigue    Complete by:  As directed      Diet general    Complete by:  As directed      Discharge instructions    Complete by:  As directed   It is very important that you get your thyroid hormone levels checked every 4-6 weeks until they are stable on medication.            Medication List    TAKE these medications        buPROPion 150 MG 24 hr tablet  Commonly known as:  WELLBUTRIN XL  Take 1 tablet (150 mg total) by mouth daily.     gabapentin 300 MG capsule  Commonly known as:  NEURONTIN  Take 1 capsule (300 mg total) by mouth 3 (three) times daily.     ibuprofen 200 MG tablet  Commonly known as:  ADVIL,MOTRIN  Take 200 mg by mouth every 6 (six) hours as needed for moderate pain.     levothyroxine 25 MCG tablet  Commonly known as:  SYNTHROID, LEVOTHROID  Take 1 tablet (25 mcg total) by mouth daily before breakfast.     lisinopril 20 MG tablet  Commonly known as:  PRINIVIL,ZESTRIL  Take 1 tablet (20 mg total) by mouth daily.     mirtazapine 7.5 MG tablet  Commonly known as:  REMERON  Take 1 tablet (7.5 mg total) by mouth at bedtime.     nitrofurantoin (macrocrystal-monohydrate) 100 MG capsule  Commonly known as:  MACROBID  Take 1 capsule (100 mg total) by mouth 2 (two) times daily.          Time coordinating discharge: 25 minutes.  Signed:  RAMA,Sharon  Pager [phone] [facility] 06/17/2015, 10:13 AM

## 2015-06-17 NOTE — H&P (Signed)
PCP:   Aura Dials, PA-C   Chief Complaint:  Fatigue  HPI:  45 year old female who  has a past medical history of Thyroid disease; Goiter; Depression; Hypertension; Fibromyalgia; Arthritis; Elevated WBC count; Anxiety; Memory loss (since 03/2012); Chronic pain; ADD (attention deficit disorder); and Tobacco abuse. Today presents to the hospital with vague complaints of fatigue. Patient says that she took ibuprofen with some relief. Denies fever, no chills. No nausea vomiting or diarrhea. No dysuria. No chest pain or shortness of breath. In the ED was found to have abnormal UA with too numerous to count WBC. Started on ceftriaxone for UTI.  Allergies:   Allergies  Allergen Reactions  . Hct [Hydrochlorothiazide] Other (See Comments)    "affects potassium more than normal"  . Lyrica [Pregabalin] Other (See Comments)    "makes her feel stupid"      Past Medical History  Diagnosis Date  . Thyroid disease     Hypothyroidism  . Goiter   . Depression   . Hypertension   . Fibromyalgia   . Arthritis   . Elevated WBC count     unknown cause  . Anxiety   . Memory loss since 03/2012  . Chronic pain   . ADD (attention deficit disorder)   . Tobacco abuse     Past Surgical History  Procedure Laterality Date  . Cesarean section  1995, 2001  . Partial hysterectomy  2009  . Abdominal hysterectomy    . Appendectomy    . Abdominal surgery    . Tonsillectomy  1998    Prior to Admission medications   Medication Sig Start Date End Date Taking? Authorizing Provider  buPROPion (WELLBUTRIN XL) 150 MG 24 hr tablet Take 1 tablet (150 mg total) by mouth daily. 02/26/15   Niel Hummer, NP  gabapentin (NEURONTIN) 300 MG capsule Take 1 capsule (300 mg total) by mouth 3 (three) times daily. 02/26/15   Niel Hummer, NP  ibuprofen (ADVIL,MOTRIN) 200 MG tablet Take 200 mg by mouth every 6 (six) hours as needed for moderate pain.    Historical Provider, MD  lisinopril (PRINIVIL,ZESTRIL) 20  MG tablet Take 1 tablet (20 mg total) by mouth daily. 02/26/15   Niel Hummer, NP  mirtazapine (REMERON) 7.5 MG tablet Take 1 tablet (7.5 mg total) by mouth at bedtime. 02/26/15   Niel Hummer, NP    Social History:  reports that she has been smoking Cigarettes.  She has a 5 pack-year smoking history. She has never used smokeless tobacco. She reports that she does not drink alcohol or use illicit drugs.  Family History  Problem Relation Age of Onset  . Hypertension Other     Parent    Filed Weights   06/16/15 2208  Weight: 69.4 kg (153 lb)    All the positives are listed in BOLD  Review of Systems:  HEENT: Headache, blurred vision, runny nose, sore throat Neck: Hypothyroidism, hyperthyroidism,,lymphadenopathy Chest : Shortness of breath, history of COPD, Asthma Heart : Chest pain, history of coronary arterey disease GI:  Nausea, vomiting, diarrhea, constipation, GERD GU: Dysuria, urgency, frequency of urination, hematuria Neuro: Stroke, seizures, syncope Psych: Depression, anxiety, hallucinations   Physical Exam: Blood pressure 109/72, pulse 68, temperature 97.6 F (36.4 C), temperature source Oral, resp. rate 18, height 5' (1.524 m), weight 69.4 kg (153 lb), SpO2 95 %. Constitutional:   Patient is a well-developed and well-nourished  infemale no acute distress and cooperative with exam. Head: Normocephalic and atraumatic Mouth:  Mucus membranes moist Eyes: PERRL, EOMI, conjunctivae normal Neck: Supple, No Thyromegaly Cardiovascular: RRR, S1 normal, S2 normal Pulmonary/Chest: CTAB, no wheezes, rales, or rhonchi Abdominal: Soft. Non-tender, non-distended, bowel sounds are normal, no masses, organomegaly, or guarding present.  Neurological: A&O x3, Strength is normal and symmetric bilaterally, cranial nerve II-XII are grossly intact, no focal motor deficit, sensory intact to light touch bilaterally.  Extremities : No Cyanosis, Clubbing or Edema  Labs on Admission:  Basic  Metabolic Panel:  Recent Labs Lab 06/17/15 0239  NA 143  K 3.6  CL 112*  CO2 20*  GLUCOSE 107*  BUN 8  CREATININE 0.70  CALCIUM 8.7*   Liver Function Tests:  Recent Labs Lab 06/17/15 0239  AST 25  ALT 24  ALKPHOS 66  BILITOT 0.8  PROT 6.4*  ALBUMIN 3.5   CBC:  Recent Labs Lab 06/17/15 0239  WBC 13.1*  NEUTROABS 8.2*  HGB 13.0  HCT 36.9  MCV 92.0  PLT 257    Radiological Exams on Admission: Dg Chest 2 View  06/17/2015  CLINICAL DATA:  Weakness, decreased oral intake, and confusion for 1 week. Difficulty to arouse. EXAM: CHEST  2 VIEW COMPARISON:  05/17/2013 FINDINGS: The heart size and mediastinal contours are within normal limits. Both lungs are clear. The visualized skeletal structures are unremarkable. IMPRESSION: No active cardiopulmonary disease. Electronically Signed   By: Lucienne Capers M.D.   On: 06/17/2015 01:15      Assessment/Plan Active Problems:   Urinary tract infectious disease   UTI (lower urinary tract infection)   UTI Admit the patient under observation, started on ceftriaxone Follow urine culture results  Fatigue Likely from above, Tylenol when necessary for pain   DVT prophylaxis Lovenox   Code status:  full code   Family discussion: No family at bedside   Time Spent on Admission: 2 minutes  [facility] Pager: [phone] 06/17/2015, 5:46 AM  If 7PM-7AM, please contact night-coverage  [email or web address]  Password [email or web address]

## 2015-06-17 NOTE — Care Management Note (Signed)
Case Management Note  Patient Details  Name: Sharon Atkins MRN: [id number] Date of Birth: 1970-09-11  Subjective/Objective:                  Pt is from home, lives with her mother. Pt is ind with ADL's. Pt plans to return home with self care. Pt receives PCP care from [facility]. Pt states she has not been to him in over a year and she was uninsured the last time she went.   Action/Plan: [place] office called and Dr. Wolfgang Phoenix will cont to see pt now that she has Medicaid. Appointment made and given to pt. Pt instructed to call office if she is unable to keep appointment time. No further CM needs.   Expected Discharge Date:     06/17/2015             Expected Discharge Plan:  Home/Self Care  In-House Referral:  NA  Discharge planning Services  CM Consult, Follow-up appt scheduled  Post Acute Care Choice:    Choice offered to:     DME Arranged:    DME Agency:     HH Arranged:    HH Agency:     Status of Service:  Completed, signed off  Medicare Important Message Given:    Date Medicare IM Given:    Medicare IM give by:    Date Additional Medicare IM Given:    Additional Medicare Important Message give by:     If discussed at [place] of Stay Meetings, dates discussed:    Additional Comments:  Sherald Barge, RN 06/17/2015, 2:10 PM

## 2015-06-17 NOTE — ED Notes (Signed)
Patient ambulatory to restroom  ?

## 2015-06-18 LAB — URINE CULTURE

## 2015-06-22 ENCOUNTER — Encounter: Payer: Self-pay | Admitting: Family Medicine

## 2015-06-22 ENCOUNTER — Ambulatory Visit (INDEPENDENT_AMBULATORY_CARE_PROVIDER_SITE_OTHER): Payer: Medicaid Other | Admitting: Family Medicine

## 2015-06-22 VITALS — BP 150/100 | Temp 98.5°F | Ht 60.0 in | Wt 155.2 lb

## 2015-06-22 DIAGNOSIS — E039 Hypothyroidism, unspecified: Secondary | ICD-10-CM | POA: Diagnosis not present

## 2015-06-22 DIAGNOSIS — I10 Essential (primary) hypertension: Secondary | ICD-10-CM | POA: Diagnosis not present

## 2015-06-22 DIAGNOSIS — R350 Frequency of micturition: Secondary | ICD-10-CM | POA: Diagnosis not present

## 2015-06-22 LAB — POCT URINALYSIS DIPSTICK
Spec Grav, UA: <=1.005
pH, UA: 7

## 2015-06-22 MED ORDER — LISINOPRIL 10 MG PO TABS: 10.0000 mg | ORAL_TABLET | Freq: Every day | ORAL | Status: DC

## 2015-06-22 MED ORDER — CIPROFLOXACIN HCL 500 MG PO TABS: 500.0000 mg | ORAL_TABLET | Freq: Two times a day (BID) | ORAL | Status: DC

## 2015-06-22 MED ORDER — LEVOTHYROXINE SODIUM 75 MCG PO TABS: 75.0000 ug | ORAL_TABLET | Freq: Every day | ORAL | Status: DC

## 2015-06-22 MED ORDER — HYDROCODONE-ACETAMINOPHEN 5-325 MG PO TABS
1.0000 | ORAL_TABLET | Freq: Four times a day (QID) | ORAL | Status: DC | PRN
Start: 2015-06-22 — End: 2015-07-29

## 2015-06-22 NOTE — Progress Notes (Signed)
   Subjective:    Patient ID: Sharon Atkins, female    DOB: 1971-01-13, 45 y.o.   MRN: [id number]  patient arrives office with several distinct concerns. HPI  patient arrives for follow-up from the hospital. Was in with pyelonephritis. Also had mental status changes felt contributed to by hypothyroidism with noncompliance. Next  On further history patient notes should come off her thyroid medicine recent months. Really does not have a good reason why. Noted. Progressive fatigue. Next  Was given to several days of antibiotics for UTI. Now notes substantial reemergence of flank pain left side along with achiness and low-grade fever and flulike symptoms. Next   no longer compliant with blood pressure medication. No real good reason. 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 Pain medicine for flank pain blood pressure meds reviewed recheck TSH in  One month office visit3 months

## 2015-06-22 NOTE — Progress Notes (Signed)
   Subjective:    Patient ID: Sharon Atkins, female    DOB: 1970-07-26, 45 y.o.   MRN: [id number]  HPI Patient is here today for a hospital follow up visit. Patient was admitted to the hospital on 06/16/15 and diagnosed with hypothyroidism. Patient states that she is still having fever, low back pain and urinary frequency. Patient was treated for a UTI while in the hospital.    Review of Systems     Objective:   Physical Exam        Assessment & Plan:

## 2015-06-24 LAB — URINE CULTURE: Organism ID, Bacteria: NO GROWTH

## 2015-07-07 ENCOUNTER — Telehealth: Payer: Self-pay | Admitting: Family Medicine

## 2015-07-07 NOTE — Telephone Encounter (Signed)
Culture grew out nothing, actually good news to sow that her infxn at the time was responding to her abx, doew she think she still has a uti or that it has come bk? If so, can drop by a urine for u a and rep abx if necessary.

## 2015-07-07 NOTE — Telephone Encounter (Signed)
Pt called wanting to know the results to her urine culture from 06/22/15.

## 2015-07-07 NOTE — Telephone Encounter (Signed)
Notified patient culture grew out nothing, actually good news to sow that her infxn at the time was responding to her abx, doew she think she still has a uti or that it has come bk? If so, can drop by a urine for u a and rep abx if necessary. Patient states that she has noticed some urinary frequency but no other symptoms. Patient states that she will call back if symptoms worsen over the next couple of days.

## 2015-07-09 ENCOUNTER — Other Ambulatory Visit: Payer: Self-pay

## 2015-07-09 ENCOUNTER — Telehealth: Payer: Self-pay | Admitting: Family Medicine

## 2015-07-09 MED ORDER — CIPROFLOXACIN HCL 500 MG PO TABS: 500.0000 mg | ORAL_TABLET | Freq: Two times a day (BID) | ORAL | Status: DC

## 2015-07-09 NOTE — Telephone Encounter (Signed)
Please see telephone encounter form 07/07/15. Patient states that she is still having back pain and urinary frequency. Per Dr. Richardson Landry sent another round of Cipro to pharmacy for patient. Patient unable to come up here today to give a urine due to lack of transportation. Advised patient to call back next week if she is no better.

## 2015-07-09 NOTE — Telephone Encounter (Signed)
Pt is requesting another antibiotic to be called in.states that she is still not better.     Sharon Atkins

## 2015-07-28 ENCOUNTER — Telehealth: Payer: Self-pay | Admitting: Family Medicine

## 2015-07-28 NOTE — Telephone Encounter (Signed)
[facility]  To let pt know that she needs an appt.

## 2015-07-28 NOTE — Telephone Encounter (Signed)
Pt states that she feels like she is having another issue with UTI She thinks you told her that if she did this again that she could  Swing by and give [place] a urine sample. Advised we don't do that  Typically but that I would ask. (this was told to her by the last nurse That spoke to her, don't remember the name)   She will be in town tomorrow and wants to do it then if possible or  Have an appt on the books to have it checked again.   Current symptoms frequency, lower back pain left side   [facility] pharm

## 2015-07-28 NOTE — Telephone Encounter (Signed)
Discussed with pt. Pt transferred to front to schedule office visit. 

## 2015-07-29 ENCOUNTER — Ambulatory Visit (INDEPENDENT_AMBULATORY_CARE_PROVIDER_SITE_OTHER): Payer: Medicaid Other | Admitting: Nurse Practitioner

## 2015-07-29 ENCOUNTER — Encounter: Payer: Self-pay | Admitting: Nurse Practitioner

## 2015-07-29 VITALS — BP 144/90 | Temp 98.5°F | Ht 60.0 in | Wt 152.5 lb

## 2015-07-29 DIAGNOSIS — N390 Urinary tract infection, site not specified: Secondary | ICD-10-CM | POA: Diagnosis not present

## 2015-07-29 DIAGNOSIS — N10 Acute tubulo-interstitial nephritis: Secondary | ICD-10-CM

## 2015-07-29 LAB — POCT UA - MICROSCOPIC ONLY
BACTERIA, U MICROSCOPIC: 0
RBC, urine, microscopic: 0

## 2015-07-29 LAB — POCT URINALYSIS DIPSTICK
Blood, UA: NEGATIVE
PH UA: 7
Spec Grav, UA: <=1.005

## 2015-07-29 MED ORDER — CEFPROZIL 500 MG PO TABS: 500.0000 mg | ORAL_TABLET | Freq: Two times a day (BID) | ORAL | Status: DC

## 2015-07-29 MED ORDER — HYDROCODONE-ACETAMINOPHEN 10-325 MG PO TABS: 1.0000 | ORAL_TABLET | ORAL | Status: DC | PRN

## 2015-07-29 NOTE — Progress Notes (Signed)
Subjective:  Presents for complaints of urinary frequency and urgency for the past few days. Just completed her second course of Cipro last week after being hospitalized for UTI and dehydration which was treated with Macrobid. During her second course of Cipro symptoms seem to have resolved. Now having some left CVA and flank discomfort. No fever. Taking fluids well. Urinary pressure. Has had a hysterectomy. Same sexual partner. No vaginal discharge. No obvious blood in her urine. Has taken some leftover hydrocodone 5 mg which has helped slightly.  Objective:   BP 144/90 mmHg  Temp(Src) 98.5 F (36.9 C) (Oral)  Ht 5' (1.524 m)  Wt 152 lb 8 oz (69.174 kg)  BMI 29.78 kg/m2 NAD. Alert, oriented. Lungs clear. Heart regular rate rhythm. Positive left CVA and flank tenderness on exam. Abdomen soft nondistended with left lower pelvic and suprapubic area discomfort with palpation. Results for orders placed or performed in visit on 07/29/15  POCT urinalysis dipstick  Result Value Ref Range   Color, UA Yellow    Clarity, UA Clear    Glucose, UA     Bilirubin, UA     Ketones, UA     Spec Grav, UA <=1.005    Blood, UA Negative    pH, UA 7.0    Protein, UA     Urobilinogen, UA     Nitrite, UA     Leukocytes, UA large (3+) (A) Negative  POCT UA - Microscopic Only  Result Value Ref Range   WBC, Ur, HPF, POC 0-2    RBC, urine, microscopic 0    Bacteria, U Microscopic 0    Mucus, UA     Epithelial cells, urine per micros rare    Crystals, Ur, HPF, POC     Casts, Ur, LPF, POC     Yeast, UA       Assessment:  Problem List Items Addressed This Visit      Genitourinary   UTI (lower urinary tract infection) - Primary   Relevant Medications   cefPROZIL (CEFZIL) 500 MG tablet   Other Relevant Orders   POCT urinalysis dipstick (Completed)   Urine Culture    Other Visit Diagnoses    Acute pyelonephritis        Relevant Medications    cefPROZIL (CEFZIL) 500 MG tablet    Other Relevant  Orders    Urine Culture      Plan:  Meds ordered this encounter  Medications  . cefPROZIL (CEFZIL) 500 MG tablet    Sig: Take 1 tablet (500 mg total) by mouth 2 (two) times daily.    Dispense:  20 tablet    Refill:  0    Order Specific Question:  Supervising Provider    Answer:  Mikey Kirschner [[id number]]  . HYDROcodone-acetaminophen (NORCO) 10-325 MG tablet    Sig: Take 1 tablet by mouth every 4 (four) hours as needed for severe pain.    Dispense:  24 tablet    Refill:  0    Order Specific Question:  Supervising Provider    Answer:  Mikey Kirschner [[id number]]   Urine culture pending. Previous culture done in March had multiple flora. Start Cefzil as directed. Increase hydrocodone dose to 10 mg, drowsiness precautions. Warning signs reviewed including fever vomiting increasing pain or blood in her urine. Call back in 4 days if no significant improvement, call or go to ED sooner if worse.

## 2015-07-30 LAB — TSH: TSH: 19.44 u[IU]/mL — AB (ref 0.450–4.500)

## 2015-07-31 LAB — URINE CULTURE: Organism ID, Bacteria: NO GROWTH

## 2015-08-26 ENCOUNTER — Encounter: Payer: Self-pay | Admitting: Family Medicine

## 2015-08-26 ENCOUNTER — Ambulatory Visit (INDEPENDENT_AMBULATORY_CARE_PROVIDER_SITE_OTHER): Payer: Medicaid Other | Admitting: Family Medicine

## 2015-08-26 VITALS — BP 130/88 | Temp 98.9°F | Ht 60.0 in | Wt 145.0 lb

## 2015-08-26 DIAGNOSIS — I10 Essential (primary) hypertension: Secondary | ICD-10-CM | POA: Diagnosis not present

## 2015-08-26 DIAGNOSIS — F329 Major depressive disorder, single episode, unspecified: Secondary | ICD-10-CM

## 2015-08-26 DIAGNOSIS — E039 Hypothyroidism, unspecified: Secondary | ICD-10-CM

## 2015-08-26 DIAGNOSIS — R350 Frequency of micturition: Secondary | ICD-10-CM | POA: Diagnosis not present

## 2015-08-26 DIAGNOSIS — R5383 Other fatigue: Secondary | ICD-10-CM | POA: Diagnosis not present

## 2015-08-26 DIAGNOSIS — Z1231 Encounter for screening mammogram for malignant neoplasm of breast: Secondary | ICD-10-CM

## 2015-08-26 DIAGNOSIS — F32A Depression, unspecified: Secondary | ICD-10-CM

## 2015-08-26 LAB — POCT URINALYSIS DIPSTICK
PH UA: 6
Spec Grav, UA: <=1.005

## 2015-08-26 NOTE — Progress Notes (Signed)
   Subjective:    Patient ID: Sharon Atkins, female    DOB: [birth year], 45 y.o.   MRN: [id number]  HPI Patient is here today for recurrent urinary infections. Patient states that she is experiencing back pain, urinary frequency and fevers. Onset 2 months ago.   does not feel depressed Pt notes challenges with urinating  Mild dysuria positive increase frequency, did have substantial pyelonephritis earlier this year   Pt ntes no energy, feels like she is running a temp  Pt just feels sick n Patient has has a knot under her right armpit.   Not able to exercise because of no energy   Pt self treating with fathers m h meds     patient on medication substantially for mental health issues , both Remeron and Wellbutrin. She states has not been her mental health provider since last year and currently is using her father's medications. She claims he was on the exact same dose.   patient claims compliance with thyroid medicine. TSH very high when last checked. At that time admitted to noncompliance  Review of Systems  no headache chronic low back pain no chest pain no change in bowel habits no blood in stool ROS otherwise negative    Objective:   Physical Exam  alert vitals stable hydration good H&T slight nasal congestion lungs clear. Heart rate and rhythm. 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 Appropriate blood work. Strongly encouraged to get back with her mental health provider. Encouraged not to self treat her mental health issues with relatives medications. Mammogram to be scheduled today. Encourage women's wellness exam with Sharon Atkins to be scheduled warning signs discussed  , 40 minutes spent most in discussionWSL

## 2015-08-27 LAB — BASIC METABOLIC PANEL
BUN / CREAT RATIO: 9 (ref 9–23)
BUN: 8 mg/dL (ref 6–24)
CO2: 19 mmol/L (ref 18–29)
CREATININE: 0.94 mg/dL (ref 0.57–1.00)
Calcium: 10.1 mg/dL (ref 8.7–10.2)
Chloride: 99 mmol/L (ref 96–106)
GFR calc Af Amer: 84 mL/min/{1.73_m2} (ref >59)
GFR, EST NON AFRICAN AMERICAN: 73 mL/min/{1.73_m2} (ref >59)
Glucose: 123 mg/dL — ABNORMAL HIGH (ref 65–99)
Potassium: 2.7 mmol/L — ABNORMAL LOW (ref 3.5–5.2)
SODIUM: 140 mmol/L (ref 134–144)

## 2015-08-27 LAB — CBC WITH DIFFERENTIAL/PLATELET
BASOS: 1 %
Basophils Absolute: 0.1 10*3/uL (ref 0.0–0.2)
EOS (ABSOLUTE): 0.5 10*3/uL — ABNORMAL HIGH (ref 0.0–0.4)
EOS: 4 %
HEMATOCRIT: 44.8 % (ref 34.0–46.6)
Hemoglobin: 15.3 g/dL (ref 11.1–15.9)
Immature Grans (Abs): 0 10*3/uL (ref 0.0–0.1)
Immature Granulocytes: 0 %
LYMPHS ABS: 2.4 10*3/uL (ref 0.7–3.1)
Lymphs: 21 %
MCH: 31.7 pg (ref 26.6–33.0)
MCHC: 34.2 g/dL (ref 31.5–35.7)
MCV: 93 fL (ref 79–97)
MONOS ABS: 0.4 10*3/uL (ref 0.1–0.9)
Monocytes: 4 %
Neutrophils Absolute: 8.1 10*3/uL — ABNORMAL HIGH (ref 1.4–7.0)
Neutrophils: 70 %
Platelets: 366 10*3/uL (ref 150–379)
RBC: 4.82 x10E6/uL (ref 3.77–5.28)
RDW: 15.1 % (ref 12.3–15.4)
WBC: 11.5 10*3/uL — ABNORMAL HIGH (ref 3.4–10.8)

## 2015-08-27 LAB — TSH: TSH: 16.18 u[IU]/mL — ABNORMAL HIGH (ref 0.450–4.500)

## 2015-08-27 LAB — HEPATIC FUNCTION PANEL
ALT: 21 IU/L (ref 0–32)
AST: 35 IU/L (ref 0–40)
Albumin: 4.4 g/dL (ref 3.5–5.5)
Alkaline Phosphatase: 112 IU/L (ref 39–117)
BILIRUBIN TOTAL: 0.4 mg/dL (ref 0.0–1.2)
Bilirubin, Direct: 0.1 mg/dL (ref 0.00–0.40)
Total Protein: 7.2 g/dL (ref 6.0–8.5)

## 2015-08-28 LAB — URINE CULTURE

## 2015-08-30 ENCOUNTER — Ambulatory Visit (HOSPITAL_COMMUNITY)
Admission: RE | Admit: 2015-08-30 | Discharge: 2015-08-30 | Disposition: A | Discharge status: Home / Self Care | Payer: Medicaid Other | Source: Ambulatory Visit | Attending: Family Medicine | Admitting: Family Medicine

## 2015-08-30 ENCOUNTER — Ambulatory Visit (HOSPITAL_COMMUNITY): Admission: RE | Admit: 2015-08-30 | Payer: Medicaid Other | Source: Ambulatory Visit

## 2015-08-30 ENCOUNTER — Other Ambulatory Visit: Payer: Self-pay | Admitting: Family Medicine

## 2015-08-30 ENCOUNTER — Other Ambulatory Visit: Payer: Self-pay

## 2015-08-30 DIAGNOSIS — Z1231 Encounter for screening mammogram for malignant neoplasm of breast: Secondary | ICD-10-CM

## 2015-08-30 MED ORDER — LEVOTHYROXINE SODIUM 112 MCG PO TABS: 112.0000 ug | ORAL_TABLET | Freq: Every day | ORAL | Status: DC

## 2015-09-23 ENCOUNTER — Emergency Department (HOSPITAL_COMMUNITY): Payer: Medicaid Other

## 2015-09-23 ENCOUNTER — Inpatient Hospital Stay (HOSPITAL_COMMUNITY)
Admission: EM | Admit: 2015-09-23 | Discharge: 2015-09-28 | DRG: 641 | Disposition: A | Discharge status: Home / Self Care | Payer: Medicaid Other | Attending: Internal Medicine | Admitting: Internal Medicine

## 2015-09-23 ENCOUNTER — Encounter (HOSPITAL_COMMUNITY): Payer: Self-pay

## 2015-09-23 DIAGNOSIS — W19XXXA Unspecified fall, initial encounter: Secondary | ICD-10-CM

## 2015-09-23 DIAGNOSIS — I10 Essential (primary) hypertension: Secondary | ICD-10-CM | POA: Diagnosis present

## 2015-09-23 DIAGNOSIS — Z90710 Acquired absence of both cervix and uterus: Secondary | ICD-10-CM | POA: Diagnosis not present

## 2015-09-23 DIAGNOSIS — R079 Chest pain, unspecified: Secondary | ICD-10-CM | POA: Diagnosis not present

## 2015-09-23 DIAGNOSIS — E039 Hypothyroidism, unspecified: Secondary | ICD-10-CM | POA: Diagnosis present

## 2015-09-23 DIAGNOSIS — A419 Sepsis, unspecified organism: Secondary | ICD-10-CM | POA: Diagnosis not present

## 2015-09-23 DIAGNOSIS — R339 Retention of urine, unspecified: Secondary | ICD-10-CM

## 2015-09-23 DIAGNOSIS — E869 Volume depletion, unspecified: Secondary | ICD-10-CM | POA: Diagnosis present

## 2015-09-23 DIAGNOSIS — Z95828 Presence of other vascular implants and grafts: Secondary | ICD-10-CM

## 2015-09-23 DIAGNOSIS — N139 Obstructive and reflux uropathy, unspecified: Secondary | ICD-10-CM

## 2015-09-23 DIAGNOSIS — G723 Periodic paralysis: Secondary | ICD-10-CM | POA: Diagnosis not present

## 2015-09-23 DIAGNOSIS — R9431 Abnormal electrocardiogram [ECG] [EKG]: Secondary | ICD-10-CM | POA: Diagnosis not present

## 2015-09-23 DIAGNOSIS — Z8249 Family history of ischemic heart disease and other diseases of the circulatory system: Secondary | ICD-10-CM

## 2015-09-23 DIAGNOSIS — E876 Hypokalemia: Secondary | ICD-10-CM | POA: Diagnosis present

## 2015-09-23 DIAGNOSIS — R7989 Other specified abnormal findings of blood chemistry: Secondary | ICD-10-CM

## 2015-09-23 DIAGNOSIS — F17210 Nicotine dependence, cigarettes, uncomplicated: Secondary | ICD-10-CM | POA: Diagnosis present

## 2015-09-23 DIAGNOSIS — E0590 Thyrotoxicosis, unspecified without thyrotoxic crisis or storm: Secondary | ICD-10-CM | POA: Diagnosis present

## 2015-09-23 DIAGNOSIS — I959 Hypotension, unspecified: Secondary | ICD-10-CM | POA: Diagnosis present

## 2015-09-23 DIAGNOSIS — R531 Weakness: Secondary | ICD-10-CM | POA: Diagnosis present

## 2015-09-23 DIAGNOSIS — E872 Acidosis: Secondary | ICD-10-CM | POA: Diagnosis present

## 2015-09-23 DIAGNOSIS — N2589 Other disorders resulting from impaired renal tubular function: Secondary | ICD-10-CM | POA: Diagnosis present

## 2015-09-23 DIAGNOSIS — D649 Anemia, unspecified: Secondary | ICD-10-CM | POA: Diagnosis present

## 2015-09-23 DIAGNOSIS — E538 Deficiency of other specified B group vitamins: Secondary | ICD-10-CM | POA: Diagnosis present

## 2015-09-23 DIAGNOSIS — R748 Abnormal levels of other serum enzymes: Secondary | ICD-10-CM | POA: Diagnosis present

## 2015-09-23 DIAGNOSIS — M797 Fibromyalgia: Secondary | ICD-10-CM | POA: Diagnosis present

## 2015-09-23 DIAGNOSIS — D72829 Elevated white blood cell count, unspecified: Secondary | ICD-10-CM

## 2015-09-23 DIAGNOSIS — I248 Other forms of acute ischemic heart disease: Secondary | ICD-10-CM | POA: Diagnosis present

## 2015-09-23 DIAGNOSIS — M1990 Unspecified osteoarthritis, unspecified site: Secondary | ICD-10-CM | POA: Diagnosis present

## 2015-09-23 DIAGNOSIS — R778 Other specified abnormalities of plasma proteins: Secondary | ICD-10-CM | POA: Diagnosis present

## 2015-09-23 HISTORY — DX: Essential (primary) hypertension: I10

## 2015-09-23 LAB — URINALYSIS, ROUTINE W REFLEX MICROSCOPIC
BILIRUBIN URINE: NEGATIVE
Glucose, UA: NEGATIVE mg/dL
Ketones, ur: NEGATIVE mg/dL
NITRITE: NEGATIVE
PH: 6.5 (ref 5.0–8.0)

## 2015-09-23 LAB — BLOOD GAS, ARTERIAL
ACID-BASE DEFICIT: 10.5 mmol/L — AB (ref 0.0–2.0)
BICARBONATE: 16.8 meq/L — AB (ref 20.0–24.0)
Drawn by: 23534
FIO2: 0.21
O2 Content: 21 L/min
O2 Saturation: 97.6 %
PCO2 ART: 26.8 mmHg — AB (ref 35.0–45.0)
PH ART: 7.342 — AB (ref 7.350–7.450)
PO2 ART: 100 mmHg (ref 80.0–100.0)

## 2015-09-23 LAB — TROPONIN I
TROPONIN I: 0.08 ng/mL — AB (ref <0.031)
Troponin I: 0.13 ng/mL — ABNORMAL HIGH (ref <0.031)

## 2015-09-23 LAB — HEPATIC FUNCTION PANEL
ALBUMIN: 4.7 g/dL (ref 3.5–5.0)
ALK PHOS: 84 U/L (ref 38–126)
ALT: 13 U/L — AB (ref 14–54)
AST: 22 U/L (ref 15–41)
BILIRUBIN TOTAL: 0.9 mg/dL (ref 0.3–1.2)
Bilirubin, Direct: 0.1 mg/dL (ref 0.1–0.5)
Indirect Bilirubin: 0.8 mg/dL (ref 0.3–0.9)
Total Protein: 8.7 g/dL — ABNORMAL HIGH (ref 6.5–8.1)

## 2015-09-23 LAB — CBC WITH DIFFERENTIAL/PLATELET
Basophils Absolute: 0 10*3/uL (ref 0.0–0.1)
Basophils Relative: 0 %
EOS PCT: 1 %
Eosinophils Absolute: 0.3 10*3/uL (ref 0.0–0.7)
HEMATOCRIT: 49.3 % — AB (ref 36.0–46.0)
Hemoglobin: 18 g/dL — ABNORMAL HIGH (ref 12.0–15.0)
Lymphocytes Relative: 12 %
Lymphs Abs: 3.4 10*3/uL (ref 0.7–4.0)
MCH: 32.2 pg (ref 26.0–34.0)
MCHC: 36.5 g/dL — ABNORMAL HIGH (ref 30.0–36.0)
MCV: 88.2 fL (ref 78.0–100.0)
MONOS PCT: 4 %
Monocytes Absolute: 1.1 10*3/uL — ABNORMAL HIGH (ref 0.1–1.0)
NEUTROS PCT: 83 %
Neutro Abs: 23.6 10*3/uL — ABNORMAL HIGH (ref 1.7–7.7)
PLATELETS: 443 10*3/uL — AB (ref 150–400)
RBC: 5.59 MIL/uL — AB (ref 3.87–5.11)
RDW: 14.5 % (ref 11.5–15.5)
WBC: 28.4 10*3/uL — AB (ref 4.0–10.5)

## 2015-09-23 LAB — BASIC METABOLIC PANEL
ANION GAP: 14 (ref 5–15)
ANION GAP: 4 — AB (ref 5–15)
BUN: 10 mg/dL (ref 6–20)
BUN: 10 mg/dL (ref 6–20)
CALCIUM: 10.4 mg/dL — AB (ref 8.9–10.3)
CALCIUM: 7.6 mg/dL — AB (ref 8.9–10.3)
CO2: 13 mmol/L — AB (ref 22–32)
CO2: 16 mmol/L — ABNORMAL LOW (ref 22–32)
Chloride: 113 mmol/L — ABNORMAL HIGH (ref 101–111)
Chloride: 124 mmol/L — ABNORMAL HIGH (ref 101–111)
Creatinine, Ser: 0.99 mg/dL (ref 0.44–1.00)
Creatinine, Ser: 0.95 mg/dL (ref 0.44–1.00)
GFR calc Af Amer: >60 mL/min (ref >60)
GFR calc Af Amer: >60 mL/min (ref >60)
GFR calc non Af Amer: >60 mL/min (ref >60)
GLUCOSE: 160 mg/dL — AB (ref 65–99)
Glucose, Bld: 125 mg/dL — ABNORMAL HIGH (ref 65–99)
POTASSIUM: 1.6 mmol/L — AB (ref 3.5–5.1)
Potassium: 1.4 mmol/L — CL (ref 3.5–5.1)
SODIUM: 144 mmol/L (ref 135–145)
Sodium: 140 mmol/L (ref 135–145)

## 2015-09-23 LAB — RAPID URINE DRUG SCREEN, HOSP PERFORMED
AMPHETAMINES: NOT DETECTED
Barbiturates: NOT DETECTED
Benzodiazepines: POSITIVE — AB
Cocaine: NOT DETECTED
OPIATES: NOT DETECTED
Tetrahydrocannabinol: NOT DETECTED

## 2015-09-23 LAB — I-STAT BETA HCG BLOOD, ED (NOT ORDERABLE)

## 2015-09-23 LAB — CK
Total CK: 523 U/L — ABNORMAL HIGH (ref 38–234)
Total CK: 331 U/L — ABNORMAL HIGH (ref 38–234)

## 2015-09-23 LAB — NA AND K (SODIUM & POTASSIUM), RAND UR
Potassium Urine: 9 mmol/L
Sodium, Ur: 32 mmol/L

## 2015-09-23 LAB — URINE MICROSCOPIC-ADD ON

## 2015-09-23 LAB — LACTIC ACID, PLASMA
LACTIC ACID, VENOUS: 1.7 mmol/L (ref 0.5–2.0)
Lactic Acid, Venous: 1.7 mmol/L (ref 0.5–2.0)

## 2015-09-23 LAB — MRSA PCR SCREENING: MRSA BY PCR: NEGATIVE

## 2015-09-23 LAB — MAGNESIUM: Magnesium: 2.2 mg/dL (ref 1.7–2.4)

## 2015-09-23 LAB — T4, FREE: FREE T4: 1.62 ng/dL — AB (ref 0.61–1.12)

## 2015-09-23 LAB — CBG MONITORING, ED: GLUCOSE-CAPILLARY: 165 mg/dL — AB (ref 65–99)

## 2015-09-23 LAB — I-STAT CG4 LACTIC ACID, ED: Lactic Acid, Venous: 1.87 mmol/L (ref 0.5–2.0)

## 2015-09-23 LAB — TSH: TSH: 0.232 u[IU]/mL — AB (ref 0.350–4.500)

## 2015-09-23 LAB — PROCALCITONIN: PROCALCITONIN: 0.12 ng/mL

## 2015-09-23 MED ORDER — ASPIRIN 300 MG RE SUPP: 300.0000 mg | RECTAL | Status: AC

## 2015-09-23 MED ORDER — ASPIRIN 81 MG PO CHEW
324.0000 mg | CHEWABLE_TABLET | ORAL | Status: AC
  Administered 2015-09-24: 324 mg via ORAL
  Filled 2015-09-23: qty 4

## 2015-09-23 MED ORDER — PIPERACILLIN-TAZOBACTAM 3.375 G IVPB
3.3750 g | Freq: Three times a day (TID) | INTRAVENOUS | Status: DC
  Administered 2015-09-23 – 2015-09-24 (×2): 3.375 g via INTRAVENOUS
  Filled 2015-09-23 (×2): qty 50

## 2015-09-23 MED ORDER — VANCOMYCIN HCL IN DEXTROSE 750-5 MG/150ML-% IV SOLN
750.0000 mg | Freq: Two times a day (BID) | INTRAVENOUS | Status: DC
  Administered 2015-09-24: 750 mg via INTRAVENOUS
  Filled 2015-09-23: qty 150

## 2015-09-23 MED ORDER — SODIUM CHLORIDE 0.9 % IV BOLUS (SEPSIS)
1000.0000 mL | Freq: Once | INTRAVENOUS | Status: AC
  Administered 2015-09-23: 1000 mL via INTRAVENOUS

## 2015-09-23 MED ORDER — SODIUM CHLORIDE 0.9 % IV SOLN: 250.0000 mL | INTRAVENOUS | Status: DC | PRN

## 2015-09-23 MED ORDER — POTASSIUM CHLORIDE CRYS ER 20 MEQ PO TBCR
30.0000 meq | EXTENDED_RELEASE_TABLET | Freq: Three times a day (TID) | ORAL | Status: DC
  Administered 2015-09-23 – 2015-09-24 (×2): 30 meq via ORAL
  Filled 2015-09-23 (×2): qty 1

## 2015-09-23 MED ORDER — SODIUM CHLORIDE 0.9 % IV SOLN
INTRAVENOUS | Status: DC
  Administered 2015-09-23: 19:00:00 via INTRAVENOUS

## 2015-09-23 MED ORDER — HYDROCORTISONE NA SUCCINATE PF 100 MG IJ SOLR: 100.0000 mg | Freq: Once | INTRAMUSCULAR | Status: DC

## 2015-09-23 MED ORDER — POTASSIUM CHLORIDE 10 MEQ/100ML IV SOLN
10.0000 meq | INTRAVENOUS | Status: DC
  Administered 2015-09-23 (×3): 10 meq via INTRAVENOUS
  Filled 2015-09-23 (×3): qty 100

## 2015-09-23 MED ORDER — ONDANSETRON HCL 4 MG/2ML IJ SOLN: 4.0000 mg | Freq: Four times a day (QID) | INTRAMUSCULAR | Status: DC | PRN

## 2015-09-23 MED ORDER — HYDROCODONE-ACETAMINOPHEN 5-325 MG PO TABS
1.0000 | ORAL_TABLET | Freq: Four times a day (QID) | ORAL | Status: DC | PRN
  Administered 2015-09-23: 1 via ORAL
  Filled 2015-09-23: qty 1

## 2015-09-23 MED ORDER — PIPERACILLIN-TAZOBACTAM 3.375 G IVPB 30 MIN
3.3750 g | Freq: Once | INTRAVENOUS | Status: AC
  Administered 2015-09-23: 3.375 g via INTRAVENOUS
  Filled 2015-09-23: qty 50

## 2015-09-23 MED ORDER — ENOXAPARIN SODIUM 40 MG/0.4ML ~~LOC~~ SOLN
40.0000 mg | SUBCUTANEOUS | Status: DC
  Administered 2015-09-23 – 2015-09-27 (×5): 40 mg via SUBCUTANEOUS
  Filled 2015-09-23 (×4): qty 0.4

## 2015-09-23 MED ORDER — POTASSIUM CHLORIDE CRYS ER 20 MEQ PO TBCR
40.0000 meq | EXTENDED_RELEASE_TABLET | Freq: Once | ORAL | Status: AC
  Administered 2015-09-23: 40 meq via ORAL
  Filled 2015-09-23: qty 2

## 2015-09-23 MED ORDER — POTASSIUM CHLORIDE 10 MEQ/100ML IV SOLN
10.0000 meq | INTRAVENOUS | Status: AC
  Administered 2015-09-23 (×4): 10 meq via INTRAVENOUS
  Filled 2015-09-23 (×5): qty 100

## 2015-09-23 MED ORDER — LEVOTHYROXINE SODIUM 112 MCG PO TABS
112.0000 ug | ORAL_TABLET | Freq: Every day | ORAL | Status: DC
  Administered 2015-09-24 – 2015-09-28 (×5): 112 ug via ORAL
  Filled 2015-09-23 (×5): qty 1

## 2015-09-23 MED ORDER — ACETAMINOPHEN 325 MG PO TABS: 650.0000 mg | ORAL_TABLET | ORAL | Status: DC | PRN

## 2015-09-23 MED ORDER — VANCOMYCIN HCL 10 G IV SOLR
1250.0000 mg | Freq: Once | INTRAVENOUS | Status: AC
  Administered 2015-09-23: 1250 mg via INTRAVENOUS
  Filled 2015-09-23: qty 1250

## 2015-09-23 NOTE — ED Notes (Signed)
Pt was not able to stand for orthostatics but was able to lay and sit to do them.

## 2015-09-23 NOTE — Progress Notes (Signed)
[place] Progress Note Patient Name: Sharon Atkins DOB: [birth year] MRN: [id number]   Date of Service  09/23/2015  HPI/Events of Note  Admit weakness, hypokalemia, baseline hypothyroidism Stable on camera check TRH h&p reviewed  eICU Interventions  No eICU intervention     Intervention Category Evaluation Type: New Patient Evaluation  Simonne Maffucci 09/23/2015, 6:26 PM

## 2015-09-23 NOTE — ED Notes (Signed)
Pt has left the floor

## 2015-09-23 NOTE — Progress Notes (Signed)
Contacted [facility] for picc placement. Unable to place tonight but will be at facility 6/15 aprox 10am.

## 2015-09-23 NOTE — Consult Note (Signed)
CARDIOLOGY CONSULT NOTE       Patient ID: Sharon Atkins MRN: [id number] DOB/AGE: 46/21/1972 46 y.o.  Admit date: 09/23/2015 Referring Physician:  Kerr Primary Physician: Mickie Hillier, MD Primary Cardiologist: New Reason for Consultation: Tachycardia  Active Problems:   * No active hospital problems. *   HPI:  45 y.o. seen in ER for tachycardia. Two day history of progressive weakness.  With fall no head trauma. Unable to move UE;s or legs well. No focal neurologic findings No cervical trauma. No history of demyelinating dx, myesthenia or myopathy Hypthyroid on replacement with compliance meds. Not on statin In ER  WBC:  28.4 CPK 523 Troponin: .08   No headache no sick contacts no history of back issues or menningitis. No chest pain or cardiac history   In ER K 1.4    ROS All other systems reviewed and negative except as noted above  Past Medical History  Diagnosis Date  . Thyroid disease     Hypothyroidism  . Goiter   . Depression   . Hypertension   . Fibromyalgia   . Arthritis   . Elevated WBC count     unknown cause  . Anxiety   . Memory loss since 03/2012  . Chronic pain   . ADD (attention deficit disorder)   . Tobacco abuse   . Hypothyroidism 06/17/2015    Family History  Problem Relation Age of Onset  . Hypertension Other     Parent    Social History   Social History  . Marital Status: Divorced    Spouse Name: N/A  . Number of Children: 2  . Years of Education: 16   Occupational History  . Unemployed    Social History Main Topics  . Smoking status: Current Every Day Smoker -- 0.50 packs/day for 10 years    Types: Cigarettes  . Smokeless tobacco: Never Used  . Alcohol Use: No  . Drug Use: No  . Sexual Activity: Yes   Other Topics Concern  . Not on file   Social History Narrative   Regular exercise-yes   Caffeine Use-yes, 3-16oz daily   Patient lives at home with son.          Past Surgical History  Procedure Laterality  Date  . Cesarean section  1995, 2001  . Partial hysterectomy  2009  . Abdominal hysterectomy    . Appendectomy    . Abdominal surgery    . Tonsillectomy  1998     . [START ON 09/24/2015] vancomycin  750 mg Intravenous Q12H   . piperacillin-tazobactam (ZOSYN)  IV    . potassium chloride 10 mEq (09/23/15 1535)  . sodium chloride 1,000 mL (09/23/15 1449)  . sodium chloride 1,000 mL (09/23/15 1449)  . sodium chloride 1,000 mL (09/23/15 1535)    Physical Exam: Blood pressure 104/66, pulse 81, temperature 98.6 F (37 C), temperature source Rectal, resp. rate 17, height 5' (1.524 m), weight 145 lb (65.772 kg), SpO2 100 %.   Affect appropriate Healthy:  appears stated age 45: normal Neck supple with no adenopathy JVP normal no bruits no thyromegaly Lungs clear with no wheezing and good diaphragmatic motion Heart:  S1/S2 no murmur, no rub, gallop or click PMI normal Abdomen: benighn, BS positve, no tenderness, no AAA no bruit.  No HSM or HJR Distal pulses intact with no bruits No edema Neuro non-focal Skin warm and dry Moves arms and legs a bit can protect when dropped from gravity   Labs:  Lab Results  Component Value Date   WBC 28.4* 09/23/2015   HGB 18.0* 09/23/2015   HCT 49.3* 09/23/2015   MCV 88.2 09/23/2015   PLT 443* 09/23/2015    Recent Labs Lab 09/23/15 1236 09/23/15 1242  NA 140  --   K 1.4*  --   CL 113*  --   CO2 13*  --   BUN 10  --   CREATININE 0.99  --   CALCIUM 10.4*  --   PROT  --  8.7*  BILITOT  --  0.9  ALKPHOS  --  84  ALT  --  13*  AST  --  22  GLUCOSE 160*  --    Lab Results  Component Value Date   CKTOTAL 523* 09/23/2015   CKMB 103.7* 05/18/2013   TROPONINI 0.08* 09/23/2015   No results found for: CHOL No results found for: HDL No results found for: LDLCALC No results found for: TRIG No results found for: CHOLHDL No results found for: LDLDIRECT    Radiology: Dg Chest 1 View  09/23/2015  CLINICAL DATA:  Weakness since  yesterday.  Unable to move. EXAM: CHEST 1 VIEW COMPARISON:  Chest x-rays dated 06/17/2015 and 05/17/2013. FINDINGS: Study is hypoinspiratory with crowding of the perihilar and bibasilar bronchovascular markings. Given the low lung volumes, lungs are clear. No evidence of pneumonia. No pleural effusion or pneumothorax. Osseous and soft tissue structures about the chest are unremarkable. IMPRESSION: Low lung volumes.  No active disease. Electronically Signed   By: Franki Cabot M.D.   On: 09/23/2015 13:44   Ct Head Wo Contrast  09/23/2015  CLINICAL DATA:  Diffuse upper and lower extremity weakness. EXAM: CT HEAD WITHOUT CONTRAST CT CERVICAL SPINE WITHOUT CONTRAST TECHNIQUE: Multidetector CT imaging of the head and cervical spine was performed following the standard protocol without intravenous contrast. Multiplanar CT image reconstructions of the cervical spine were also generated. COMPARISON:  Head CT May 17, 2013; cervical MRI September 17, 2012; CT cervical spine September 16, 2012 FINDINGS: CT HEAD FINDINGS The ventricles are normal in size and configuration. There is no intracranial mass, hemorrhage, extra-axial fluid collection, or midline shift. Gray-white compartments appear normal. No acute infarct evident. Bony calvarium appears intact. The mastoid air cells are clear. No intraorbital lesions are evident. CT CERVICAL SPINE FINDINGS There is no fracture or spondylolisthesis. Prevertebral soft tissues and predental space regions are normal. The disc spaces appear unremarkable. There is facet hypertrophy at several levels bilaterally. No nerve root edema or effacement is evident on this study. No disc extrusion or stenosis. IMPRESSION: CT head:  Study within normal limits. CT cervical spine: No fracture or spondylolisthesis. Relatively mild osteoarthritic changes noted at several levels. No nerve root edema or effacement is apparent. No disc extrusion or stenosis. Should be noted that assessment of the cervical  cord is quite limited on noncontrast enhanced cervical spine CT. On most recent thoracolumbar from 2014, the patient did have paraspinal edema, not evident on current CT. It may be reasonable to consider MR correlation at this time, ideally pre and postcontrast, given the patient's symptoms and findings on prior MR examination from 2014. Electronically Signed   By: Lowella Grip III M.D.   On: 09/23/2015 13:54   Ct Cervical Spine Wo Contrast  09/23/2015  CLINICAL DATA:  Diffuse upper and lower extremity weakness. EXAM: CT HEAD WITHOUT CONTRAST CT CERVICAL SPINE WITHOUT CONTRAST TECHNIQUE: Multidetector CT imaging of the head and cervical spine was performed following the standard protocol  without intravenous contrast. Multiplanar CT image reconstructions of the cervical spine were also generated. COMPARISON:  Head CT May 17, 2013; cervical MRI September 17, 2012; CT cervical spine September 16, 2012 FINDINGS: CT HEAD FINDINGS The ventricles are normal in size and configuration. There is no intracranial mass, hemorrhage, extra-axial fluid collection, or midline shift. Gray-white compartments appear normal. No acute infarct evident. Bony calvarium appears intact. The mastoid air cells are clear. No intraorbital lesions are evident. CT CERVICAL SPINE FINDINGS There is no fracture or spondylolisthesis. Prevertebral soft tissues and predental space regions are normal. The disc spaces appear unremarkable. There is facet hypertrophy at several levels bilaterally. No nerve root edema or effacement is evident on this study. No disc extrusion or stenosis. IMPRESSION: CT head:  Study within normal limits. CT cervical spine: No fracture or spondylolisthesis. Relatively mild osteoarthritic changes noted at several levels. No nerve root edema or effacement is apparent. No disc extrusion or stenosis. Should be noted that assessment of the cervical cord is quite limited on noncontrast enhanced cervical spine CT. On most recent  thoracolumbar from 2014, the patient did have paraspinal edema, not evident on current CT. It may be reasonable to consider MR correlation at this time, ideally pre and postcontrast, given the patient's symptoms and findings on prior MR examination from 2014. Electronically Signed   By: Lowella Grip III M.D.   On: 09/23/2015 13:54   Mm Screening Breast Tomo Bilateral  09/02/2015  CLINICAL DATA:  Screening. EXAM: 2D DIGITAL SCREENING BILATERAL MAMMOGRAM WITH CAD AND ADJUNCT TOMO COMPARISON:  Previous exam(s). ACR Breast Density Category b: There are scattered areas of fibroglandular density. FINDINGS: There are no findings suspicious for malignancy. Images were processed with CAD. IMPRESSION: No mammographic evidence of malignancy. A result letter of this screening mammogram will be mailed directly to the patient. RECOMMENDATION: Screening mammogram in one year. (Code:SM-B-01Y) BI-RADS CATEGORY  1: Negative. Electronically Signed   By: Everlean Alstrom M.D.   On: 09/02/2015 08:32    EKG: 1) ST rate 129 ST depression 1,2,AVL ST up in AVR  2) improved with K  SR rate 90 low voltage    ASSESSMENT AND PLAN:  Tachycardia:  Initially ? Flutter but now clearly SR/ST.  Check echo. Likely due to acute illness ? Infection Check TSH given synthroid replacement and goiter  CPK:  Elevation no chest pain ? Rhabdomyolysis etiology unclear hydrate  Neuro:  Muscle weakness from hypokalemia Only on ACE for BP.  Hold this.  CT/C spine negative WBC Up consult neuro ? Spinal tap. Not clear why she was wasting K consider checking for signs of spurious diuretic Use.    SignedJenkins Rouge 09/23/2015, 4:32 PM

## 2015-09-23 NOTE — ED Notes (Signed)
Abrasions to bilat knees from fall at home.

## 2015-09-23 NOTE — Consult Note (Signed)
[place] A. Merlene Laughter, MD     [email or web address]          Sharon Atkins [place] is an 45 y.o. female.   ASSESSMENT/PLAN: 1. 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 This has classically being seen in undiagnosed cases of thyroid disease particularly hyperthyroidism. In this situation however I suspect that the patient's thyroid disease is also the etiology. She is somewhat hyperthyroid likely due to replacement. There is no evidence of the diuretics or other medications at this time causing the severe hypo-kalemia. No evidence of nausea vomiting or other GI symptoms. No clear history of heavy alcohol use. Doubt primary aldosteronism. Overall, I think the patient's etiology is from thyroid disease. The physical examination does not support a diagnosis of neuropathy particularly Guillain Bare syndrome or myelopathy.   2. 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 Inflammatory myopathy seems unlikely given the chronicity.   RECOMMENDATION: Agree with replacement of potassium and ICU admission. The patient's profound weakness should improve with the improving potassium levels. Additional labs will be done to evaluate for inflammatory causes of myopathy although I think this is unlikely. The patient should receive physical and occupational therapy. There is no need for additional neuroimaging at this time.  Endocrinology consultation for further evaluation for the profound hypokalemia.       The patient is a 45 year old white female who presents with 2 day history of severe weakness of the upper and lower extremities. The patient reports that she was fine up until last 2 days. The patient does not report dysarthria, dysphasia, diplopia, focal numbness or focal weakness. The weakness is global involving upper and lower extremities head and neck. The patient does not have incontinence of bladder or bowel. She does not report having had  chest pain or shortness of breath. The patient does have muscle soreness on movements. She has had elevated CPKs over last 2 years with a high of greater than 2000 2 years ago. It's unclear what the etiology for what symptoms she had at that time. Level has trended down to 800 2 years ago and then down to 500 today. The patient does not report having episodic weakness as she had today. There is no family history of episodic or periodic paralysis. The patient does not endorse taking new medications. She does not endorse taking diuretics. She does not consume large doses of tea. There may be some issues with consuming a lot of free water. The review systems is otherwise negative.   GENERAL: Pleasant female who is not acutely distressed at this time. She does appear uncomfortable with the weakness and pain throughout however.  HEENT: Neck muscles with the neck flexion 4/5 in neck extension 4/5.  ABDOMEN: soft  EXTREMITIES: No edema   BACK: Normal.  SKIN: Normal by inspection.    MENTAL STATUS: Alert and oriented. Speech, language and cognition are generally intact. Judgment and insight normal.   CRANIAL NERVES: Pupils are equal, round and reactive to light and accomodation; extra ocular movements are full, there is no significant nystagmus; visual fields are full; upper and lower facial muscles are normal in strength and symmetric, there is no flattening of the nasolabial folds; tongue is midline; uvula is midline; shoulder elevation is normal.  MOTOR: Deltoid 3/5 and hand movements approximately 3/5. Hip flexion 2/5 in dorsiflexion 0/5. Tone is reduced and bulk is normal.  COORDINATION: Left finger to nose is normal, right finger to nose is normal, No rest tremor; no intention  tremor; no postural tremor; no bradykinesia.  REFLEXES: Deep tendon reflexes are symmetrical and normal. Babinski reflexes are flexor bilaterally.   SENSATION: Normal to light touch, temperature, and pinprick.             Blood pressure 102/81, pulse 73, temperature 98.6 F (37 C), temperature source Rectal, resp. rate 17, height 5' (1.524 m), weight 145 lb (65.772 kg), SpO2 100 %.  Past Medical History  Diagnosis Date  . Thyroid disease     Hypothyroidism  . Goiter   . Depression   . Hypertension   . Fibromyalgia   . Arthritis   . Elevated WBC count     unknown cause  . Anxiety   . Memory loss since 03/2012  . Chronic pain   . ADD (attention deficit disorder)   . Tobacco abuse   . Hypothyroidism 06/17/2015    Past Surgical History  Procedure Laterality Date  . Cesarean section  1995, 2001  . Partial hysterectomy  2009  . Abdominal hysterectomy    . Appendectomy    . Abdominal surgery    . Tonsillectomy  1998    Family History  Problem Relation Age of Onset  . Hypertension Other     Parent    Social History:  reports that she has been smoking Cigarettes.  She has a 5 pack-year smoking history. She has never used smokeless tobacco. She reports that she does not drink alcohol or use illicit drugs.  Allergies:  Allergies  Allergen Reactions  . Hct [Hydrochlorothiazide] Other (See Comments)    "affects potassium more than normal"  . Lyrica [Pregabalin] Other (See Comments)    "makes her feel stupid"    Medications: Prior to Admission medications   Medication Sig Start Date End Date Taking? Authorizing Provider  ibuprofen (ADVIL,MOTRIN) 200 MG tablet Take 200 mg by mouth every 6 (six) hours as needed for moderate pain.   Yes Historical Provider, MD  levothyroxine (SYNTHROID, LEVOTHROID) 112 MCG tablet Take 1 tablet (112 mcg total) by mouth daily. 08/30/15  Yes Mikey Kirschner, MD  lisinopril (PRINIVIL,ZESTRIL) 10 MG tablet Take 1 tablet (10 mg total) by mouth daily. 06/22/15  Yes Mikey Kirschner, MD  cefPROZIL (CEFZIL) 500 MG tablet Take 1 tablet (500 mg total) by mouth 2 (two) times daily. Patient not taking: Reported on 08/26/2015 07/29/15   Nilda Simmer, NP   HYDROcodone-acetaminophen [facility]) 10-325 MG tablet Take 1 tablet by mouth every 4 (four) hours as needed for severe pain. Patient not taking: Reported on 08/26/2015 07/29/15   Nilda Simmer, NP    Scheduled Meds: . hydrocortisone sod succinate (SOLU-CORTEF) inj  100 mg Intravenous Once  . [START ON 09/24/2015] vancomycin  750 mg Intravenous Q12H   Continuous Infusions: . sodium chloride    . piperacillin-tazobactam (ZOSYN)  IV    . potassium chloride 10 mEq (09/23/15 1644)  . sodium chloride     PRN Meds:.     Results for orders placed or performed during the hospital encounter of 09/23/15 (from the past 48 hour(s))  CBG monitoring, ED     Status: Abnormal   Collection Time: 09/23/15 12:22 PM  Result Value Ref Range   Glucose-Capillary 165 (H) 65 - 99 mg/dL  CBC with Differential     Status: Abnormal   Collection Time: 09/23/15 12:36 PM  Result Value Ref Range   WBC 28.4 (H) 4.0 - 10.5 K/uL   RBC 5.59 (H) 3.87 - 5.11 MIL/uL   Hemoglobin  18.0 (H) 12.0 - 15.0 g/dL   HCT 49.3 (H) 36.0 - 46.0 %   MCV 88.2 78.0 - 100.0 fL   MCH 32.2 26.0 - 34.0 pg   MCHC 36.5 (H) 30.0 - 36.0 g/dL   RDW 14.5 11.5 - 15.5 %   Platelets 443 (H) 150 - 400 K/uL   Neutrophils Relative % 83 %   Lymphocytes Relative 12 %   Monocytes Relative 4 %   Eosinophils Relative 1 %   Basophils Relative 0 %   Neutro Abs 23.6 (H) 1.7 - 7.7 K/uL   Lymphs Abs 3.4 0.7 - 4.0 K/uL   Monocytes Absolute 1.1 (H) 0.1 - 1.0 K/uL   Eosinophils Absolute 0.3 0.0 - 0.7 K/uL   Basophils Absolute 0.0 0.0 - 0.1 K/uL   WBC Morphology MILD LEFT SHIFT (1-5% METAS, OCC MYELO, OCC BANDS)   Basic metabolic panel     Status: Abnormal   Collection Time: 09/23/15 12:36 PM  Result Value Ref Range   Sodium 140 135 - 145 mmol/L   Potassium 1.4 (LL) 3.5 - 5.1 mmol/L    Comment: CRITICAL RESULT CALLED TO, READ BACK BY AND VERIFIED WITH: MARTIN,S AT 1331 ON 09/23/2015 BY ISLEY,B    Chloride 113 (H) 101 - 111 mmol/L   CO2 13 (L) 22 -  32 mmol/L   Glucose, Bld 160 (H) 65 - 99 mg/dL   BUN 10 6 - 20 mg/dL   Creatinine, Ser 0.99 0.44 - 1.00 mg/dL   Calcium 10.4 (H) 8.9 - 10.3 mg/dL   GFR calc non Af Amer >60 >60 mL/min   GFR calc Af Amer >60 >60 mL/min    Comment: (NOTE) The eGFR has been calculated using the CKD EPI equation. This calculation has not been validated in all clinical situations. eGFR's persistently <60 mL/min signify possible Chronic Kidney Disease.    Anion gap 14 5 - 15  CK     Status: Abnormal   Collection Time: 09/23/15 12:36 PM  Result Value Ref Range   Total CK 523 (H) 38 - 234 U/L  Troponin I     Status: Abnormal   Collection Time: 09/23/15 12:36 PM  Result Value Ref Range   Troponin I 0.08 (H) <0.031 ng/mL    Comment:        PERSISTENTLY INCREASED TROPONIN VALUES IN THE RANGE OF 0.04-0.49 ng/mL CAN BE SEEN IN:       -UNSTABLE ANGINA       -CONGESTIVE HEART FAILURE       -MYOCARDITIS       -CHEST TRAUMA       -ARRYHTHMIAS       -LATE PRESENTING MYOCARDIAL INFARCTION       -COPD   CLINICAL FOLLOW-UP RECOMMENDED.   Magnesium     Status: None   Collection Time: 09/23/15 12:36 PM  Result Value Ref Range   Magnesium 2.2 1.7 - 2.4 mg/dL  Procalcitonin - Baseline     Status: None   Collection Time: 09/23/15 12:36 PM  Result Value Ref Range   Procalcitonin 0.12 ng/mL    Comment:        Interpretation: PCT (Procalcitonin) <= 0.5 ng/mL: Systemic infection (sepsis) is not likely. Local bacterial infection is possible. (NOTE)         ICU PCT Algorithm               Non ICU PCT Algorithm    ----------------------------     ------------------------------  PCT < 0.25 ng/mL                 PCT < 0.1 ng/mL     Stopping of antibiotics            Stopping of antibiotics       strongly encouraged.               strongly encouraged.    ----------------------------     ------------------------------       PCT level decrease by               PCT < 0.25 ng/mL       >= 80% from peak PCT        OR PCT 0.25 - 0.5 ng/mL          Stopping of antibiotics                                             encouraged.     Stopping of antibiotics           encouraged.    ----------------------------     ------------------------------       PCT level decrease by              PCT >= 0.25 ng/mL       < 80% from peak PCT        AND PCT >= 0.5 ng/mL            Continuin g antibiotics                                              encouraged.       Continuing antibiotics            encouraged.    ----------------------------     ------------------------------     PCT level increase compared          PCT > 0.5 ng/mL         with peak PCT AND          PCT >= 0.5 ng/mL             Escalation of antibiotics                                          strongly encouraged.      Escalation of antibiotics        strongly encouraged.   Hepatic function panel     Status: Abnormal   Collection Time: 09/23/15 12:42 PM  Result Value Ref Range   Total Protein 8.7 (H) 6.5 - 8.1 g/dL   Albumin 4.7 3.5 - 5.0 g/dL   AST 22 15 - 41 U/L   ALT 13 (L) 14 - 54 U/L   Alkaline Phosphatase 84 38 - 126 U/L   Total Bilirubin 0.9 0.3 - 1.2 mg/dL   Bilirubin, Direct 0.1 0.1 - 0.5 mg/dL   Indirect Bilirubin 0.8 0.3 - 0.9 mg/dL  I-Stat CG4 Lactic Acid, ED     Status: None   Collection Time: 09/23/15 12:48 PM  Result Value Ref Range   Lactic Acid, Venous 1.87 0.5 -  2.0 mmol/L  Urinalysis, Routine w reflex microscopic (not at [facility])     Status: Abnormal   Collection Time: 09/23/15  2:11 PM  Result Value Ref Range   Color, Urine YELLOW YELLOW   APPearance CLEAR CLEAR   Specific Gravity, Urine <1.005 (L) 1.005 - 1.030   pH 6.5 5.0 - 8.0   Glucose, UA NEGATIVE NEGATIVE mg/dL   Hgb urine dipstick SMALL (A) NEGATIVE   Bilirubin Urine NEGATIVE NEGATIVE   Ketones, ur NEGATIVE NEGATIVE mg/dL   Protein, ur TRACE (A) NEGATIVE mg/dL   Nitrite NEGATIVE NEGATIVE   Leukocytes, UA TRACE (A) NEGATIVE  Urine rapid drug screen (hosp  performed)     Status: Abnormal   Collection Time: 09/23/15  2:11 PM  Result Value Ref Range   Opiates NONE DETECTED NONE DETECTED   Cocaine NONE DETECTED NONE DETECTED   Benzodiazepines POSITIVE (A) NONE DETECTED   Amphetamines NONE DETECTED NONE DETECTED   Tetrahydrocannabinol NONE DETECTED NONE DETECTED   Barbiturates NONE DETECTED NONE DETECTED    Comment:        DRUG SCREEN FOR MEDICAL PURPOSES ONLY.  IF CONFIRMATION IS NEEDED FOR ANY PURPOSE, NOTIFY LAB WITHIN 5 DAYS.        LOWEST DETECTABLE LIMITS FOR URINE DRUG SCREEN Drug Class       Cutoff (ng/mL) Amphetamine      1000 Barbiturate      200 Benzodiazepine   295 Tricyclics       188 Opiates          300 Cocaine          300 THC              50   Urine microscopic-add on     Status: Abnormal   Collection Time: 09/23/15  2:11 PM  Result Value Ref Range   Squamous Epithelial / LPF 0-5 (A) NONE SEEN   WBC, UA 0-5 0 - 5 WBC/hpf   RBC / HPF 0-5 0 - 5 RBC/hpf   Bacteria, UA FEW (A) NONE SEEN  Blood gas, arterial ([facility] & [facility] ONLY)     Status: Abnormal   Collection Time: 09/23/15  2:25 PM  Result Value Ref Range   FIO2 0.21    O2 Content 21.0 L/min   Delivery systems ROOM AIR    pH, Arterial 7.342 (L) 7.350 - 7.450   pCO2 arterial 26.8 (L) 35.0 - 45.0 mmHg   pO2, Arterial 100 80.0 - 100.0 mmHg   Bicarbonate 16.8 (L) 20.0 - 24.0 mEq/L   Acid-base deficit 10.5 (H) 0.0 - 2.0 mmol/L   O2 Saturation 97.6 %   Collection site RIGHT RADIAL    Drawn by [phone]    Sample type ARTERIAL    Allens test (pass/fail) PASS PASS  TSH     Status: Abnormal   Collection Time: 09/23/15  3:20 PM  Result Value Ref Range   TSH 0.232 (L) 0.350 - 4.500 uIU/mL    Studies/Results:     Riggin Cuttino A. Merlene Laughter, M.D.  Diplomate, Tax adviser of Psychiatry and Neurology ( Neurology). 09/23/2015, 5:48 PM

## 2015-09-23 NOTE — Progress Notes (Signed)
Pharmacy Antibiotic Note  Sharon Atkins is a 45 y.o. female admitted on 09/23/2015 with sepsis.  Pharmacy has been consulted for vancomycin and zosyn dosing.  Plan: Vancomycin 1250 mg  IV x 1 then 750 mg IV q12 hours Zosyn 3.375 gm IV q8 hours F/u renal function, cultures and clinical course  Height: 5' (152.4 cm) Weight: 145 lb (65.772 kg) IBW/kg (Calculated) : 45.5  Temp (24hrs), Avg:98.3 F (36.8 C), Min:97.6 F (36.4 C), Max:98.6 F (37 C)   Recent Labs Lab 09/23/15 1236 09/23/15 1248  WBC 28.4*  --   CREATININE 0.99  --   LATICACIDVEN  --  1.87    Estimated Creatinine Clearance: 60.1 mL/min (by C-G formula based on Cr of 0.99).    Allergies  Allergen Reactions  . Hct [Hydrochlorothiazide] Other (See Comments)    "affects potassium more than normal"  . Lyrica [Pregabalin] Other (See Comments)    "makes her feel stupid"    Antimicrobials this admission: vanc 6/15 >>  zosyn 6/15 >>   Thank you for allowing pharmacy to be a part of this patient's care.  Excell Seltzer Poteet 09/23/2015 2:42 PM

## 2015-09-23 NOTE — ED Notes (Signed)
Critical Potassium of 1.4. MD aware.

## 2015-09-23 NOTE — ED Provider Notes (Signed)
CSN: [id number]     Arrival date & time 09/23/15  1209 History  By signing my name below, I, Nicole Kindred, attest that this documentation has been prepared under the direction and in the presence of Ezequiel Essex, MD.   Electronically Signed: Nicole Kindred, ED Scribe. 09/23/2015. 12:25 PM   Chief Complaint  Patient presents with  . Fatigue    The history is provided by the patient. No language interpreter was used.   HPI Comments: Sharon Atkins is a 45 y.o. female with PMHx of hypothyroidism, fibromyalgia, and HTN who presents to the Emergency Department complaining of gradual onset, worsening, bilateral upper and lower extremity weakness, ongoing since yesterday. She states "she can't move" and falls because "she can't walk". She also complains of pain in bilateral hips and bilateral shoulders. No other associated symptoms noted. No worsening or alleviating factors noted. Pt denies chest pain, difficulty breathing, fevers, chills, dysuria, hematuria, headache, neck pain, back pain, insect bites, numbness, tingling, saddle paresthesia, bowel incontinence, bladder incontinence, recent travel, rash, hx of heart problems, speech difficulty, trouble swallowing, visual disturbance, or any other pertinent symptoms. Pt had a recent increase in her levothyroxine dosage.  Past Medical History  Diagnosis Date  . Thyroid disease     Hypothyroidism  . Goiter   . Depression   . Hypertension   . Fibromyalgia   . Arthritis   . Elevated WBC count     unknown cause  . Anxiety   . Memory loss since 03/2012  . Chronic pain   . ADD (attention deficit disorder)   . Tobacco abuse   . Hypothyroidism 06/17/2015   Past Surgical History  Procedure Laterality Date  . Cesarean section  1995, 2001  . Partial hysterectomy  2009  . Abdominal hysterectomy    . Appendectomy    . Abdominal surgery    . Tonsillectomy  1998   Family History  Problem Relation Age of Onset  . Hypertension Other      Parent   Social History  Substance Use Topics  . Smoking status: Current Every Day Smoker -- 0.50 packs/day for 10 years    Types: Cigarettes  . Smokeless tobacco: Never Used  . Alcohol Use: No   OB History    No data available     Review of Systems A complete 10 system review of systems was obtained and all systems are negative except as noted in the HPI and PMH.    Allergies  Hct and Lyrica  Home Medications   Prior to Admission medications   Medication Sig Start Date End Date Taking? Authorizing Provider  buPROPion (WELLBUTRIN XL) 150 MG 24 hr tablet Take 1 tablet (150 mg total) by mouth daily. 02/26/15   Niel Hummer, NP  cefPROZIL (CEFZIL) 500 MG tablet Take 1 tablet (500 mg total) by mouth 2 (two) times daily. Patient not taking: Reported on 08/26/2015 07/29/15   Nilda Simmer, NP  HYDROcodone-acetaminophen [facility]) 10-325 MG tablet Take 1 tablet by mouth every 4 (four) hours as needed for severe pain. Patient not taking: Reported on 08/26/2015 07/29/15   Nilda Simmer, NP  ibuprofen (ADVIL,MOTRIN) 200 MG tablet Take 200 mg by mouth every 6 (six) hours as needed for moderate pain.    Historical Provider, MD  levothyroxine (SYNTHROID, LEVOTHROID) 112 MCG tablet Take 1 tablet (112 mcg total) by mouth daily. 08/30/15   Mikey Kirschner, MD  lisinopril (PRINIVIL,ZESTRIL) 10 MG tablet Take 1 tablet (10 mg total) by  mouth daily. 06/22/15   Mikey Kirschner, MD  mirtazapine (REMERON) 7.5 MG tablet Take 1 tablet (7.5 mg total) by mouth at bedtime. 02/26/15   Niel Hummer, NP   BP 107/72 mmHg  Pulse 135  Temp(Src) 97.6 F (36.4 C) (Oral)  Resp 18  Ht 5' (1.524 m)  Wt 145 lb (65.772 kg)  BMI 28.32 kg/m2  SpO2 100% Physical Exam  Constitutional: She is oriented to person, place, and time. She appears well-developed and well-nourished. No distress.  HENT:  Head: Normocephalic and atraumatic.  Mouth/Throat: Oropharynx is clear and moist. Mucous membranes are dry. No  oropharyngeal exudate.  Eyes: Conjunctivae and EOM are normal. Pupils are equal, round, and reactive to light.  Neck: Normal range of motion. Neck supple.  No meningismus.  Cardiovascular: Regular rhythm, normal heart sounds and intact distal pulses.  Tachycardia present.   No murmur heard. Tachycardic to 130.  Pulmonary/Chest: Effort normal and breath sounds normal. No respiratory distress.  Abdominal: Soft. There is no tenderness. There is no rebound and no guarding.  Genitourinary:  Normal rectal tone noted. Chaperone present for rectal exam.  Musculoskeletal: Normal range of motion. She exhibits no edema or tenderness.  Neurological: She is alert and oriented to person, place, and time. No cranial nerve deficit. She exhibits normal muscle tone. Coordination normal.  CN 2-12 intact. Weak grip strengths bilaterally. Unable to hold arms or legs off the bed; both arms and legs immediately fall back to bed when lifted up. Pt able to wiggle toes. Pt protects face from falling arm.  Skin: Skin is warm.  Psychiatric: She has a normal mood and affect. Her behavior is normal.  Nursing note and vitals reviewed.   ED Course  Procedures (including critical care time) DIAGNOSTIC STUDIES: Oxygen Saturation is 100% on RA, normal by my interpretation.    COORDINATION OF CARE: 12:45 PM Discussed treatment plan with pt at bedside and pt agreed to plan.  Labs Review Labs Reviewed  CBC WITH DIFFERENTIAL/PLATELET - Abnormal; Notable for the following:    WBC 28.4 (*)    RBC 5.59 (*)    Hemoglobin 18.0 (*)    HCT 49.3 (*)    MCHC 36.5 (*)    Platelets 443 (*)    Neutro Abs 23.6 (*)    Monocytes Absolute 1.1 (*)    All other components within normal limits  BASIC METABOLIC PANEL - Abnormal; Notable for the following:    Potassium 1.4 (*)    Chloride 113 (*)    CO2 13 (*)    Glucose, Bld 160 (*)    Calcium 10.4 (*)    All other components within normal limits  URINALYSIS, ROUTINE W REFLEX  MICROSCOPIC (NOT AT [facility]) - Abnormal; Notable for the following:    Specific Gravity, Urine <1.005 (*)    Hgb urine dipstick SMALL (*)    Protein, ur TRACE (*)    Leukocytes, UA TRACE (*)    All other components within normal limits  CK - Abnormal; Notable for the following:    Total CK 523 (*)    All other components within normal limits  TROPONIN I - Abnormal; Notable for the following:    Troponin I 0.08 (*)    All other components within normal limits  TSH - Abnormal; Notable for the following:    TSH 0.232 (*)    All other components within normal limits  T4, FREE - Abnormal; Notable for the following:    Free T4  1.62 (*)    All other components within normal limits  URINE RAPID DRUG SCREEN, HOSP PERFORMED - Abnormal; Notable for the following:    Benzodiazepines POSITIVE (*)    All other components within normal limits  HEPATIC FUNCTION PANEL - Abnormal; Notable for the following:    Total Protein 8.7 (*)    ALT 13 (*)    All other components within normal limits  BLOOD GAS, ARTERIAL - Abnormal; Notable for the following:    pH, Arterial 7.342 (*)    pCO2 arterial 26.8 (*)    Bicarbonate 16.8 (*)    Acid-base deficit 10.5 (*)    All other components within normal limits  URINE MICROSCOPIC-ADD ON - Abnormal; Notable for the following:    Squamous Epithelial / LPF 0-5 (*)    Bacteria, UA FEW (*)    All other components within normal limits  BASIC METABOLIC PANEL - Abnormal; Notable for the following:    Potassium 1.6 (*)    Chloride 124 (*)    CO2 16 (*)    Glucose, Bld 125 (*)    Calcium 7.6 (*)    Anion gap 4 (*)    All other components within normal limits  CK - Abnormal; Notable for the following:    Total CK 331 (*)    All other components within normal limits  TROPONIN I - Abnormal; Notable for the following:    Troponin I 0.13 (*)    All other components within normal limits  CBG MONITORING, ED - Abnormal; Notable for the following:    Glucose-Capillary  165 (*)    All other components within normal limits  MRSA PCR SCREENING  CULTURE, BLOOD (ROUTINE X 2)  CULTURE, BLOOD (ROUTINE X 2)  URINE CULTURE  MAGNESIUM  PROCALCITONIN  NA AND K (SODIUM & POTASSIUM), RAND UR  LACTIC ACID, PLASMA  LACTIC ACID, PLASMA  MAGNESIUM  TROPONIN I  TROPONIN I  BASIC METABOLIC PANEL  TROPONIN I  VITAMIN B12  RPR  HOMOCYSTEINE  ANTINUCLEAR ANTIBODIES, IFA  C-REACTIVE PROTEIN  SEDIMENTATION RATE  HIV ANTIBODY (ROUTINE TESTING)  CK  I-STAT CG4 LACTIC ACID, ED  I-STAT BETA HCG BLOOD, ED ([facility], [facility], [facility] ONLY)  I-STAT BETA HCG BLOOD, ED (NOT ORDERABLE)    Imaging Review Dg Chest 1 View  09/23/2015  CLINICAL DATA:  Weakness since yesterday.  Unable to move. EXAM: CHEST 1 VIEW COMPARISON:  Chest x-rays dated 06/17/2015 and 05/17/2013. FINDINGS: Study is hypoinspiratory with crowding of the perihilar and bibasilar bronchovascular markings. Given the low lung volumes, lungs are clear. No evidence of pneumonia. No pleural effusion or pneumothorax. Osseous and soft tissue structures about the chest are unremarkable. IMPRESSION: Low lung volumes.  No active disease. Electronically Signed   By: Franki Cabot M.D.   On: 09/23/2015 13:44   Ct Head Wo Contrast  09/23/2015  CLINICAL DATA:  Diffuse upper and lower extremity weakness. EXAM: CT HEAD WITHOUT CONTRAST CT CERVICAL SPINE WITHOUT CONTRAST TECHNIQUE: Multidetector CT imaging of the head and cervical spine was performed following the standard protocol without intravenous contrast. Multiplanar CT image reconstructions of the cervical spine were also generated. COMPARISON:  Head CT May 17, 2013; cervical MRI September 17, 2012; CT cervical spine September 16, 2012 FINDINGS: CT HEAD FINDINGS The ventricles are normal in size and configuration. There is no intracranial mass, hemorrhage, extra-axial fluid collection, or midline shift. Gray-white compartments appear normal. No acute infarct evident. Bony calvarium appears intact.  The mastoid air cells  are clear. No intraorbital lesions are evident. CT CERVICAL SPINE FINDINGS There is no fracture or spondylolisthesis. Prevertebral soft tissues and predental space regions are normal. The disc spaces appear unremarkable. There is facet hypertrophy at several levels bilaterally. No nerve root edema or effacement is evident on this study. No disc extrusion or stenosis. IMPRESSION: CT head:  Study within normal limits. CT cervical spine: No fracture or spondylolisthesis. Relatively mild osteoarthritic changes noted at several levels. No nerve root edema or effacement is apparent. No disc extrusion or stenosis. Should be noted that assessment of the cervical cord is quite limited on noncontrast enhanced cervical spine CT. On most recent thoracolumbar from 2014, the patient did have paraspinal edema, not evident on current CT. It may be reasonable to consider MR correlation at this time, ideally pre and postcontrast, given the patient's symptoms and findings on prior MR examination from 2014. Electronically Signed   By: Lowella Grip III M.D.   On: 09/23/2015 13:54   Ct Cervical Spine Wo Contrast  09/23/2015  CLINICAL DATA:  Diffuse upper and lower extremity weakness. EXAM: CT HEAD WITHOUT CONTRAST CT CERVICAL SPINE WITHOUT CONTRAST TECHNIQUE: Multidetector CT imaging of the head and cervical spine was performed following the standard protocol without intravenous contrast. Multiplanar CT image reconstructions of the cervical spine were also generated. COMPARISON:  Head CT May 17, 2013; cervical MRI September 17, 2012; CT cervical spine September 16, 2012 FINDINGS: CT HEAD FINDINGS The ventricles are normal in size and configuration. There is no intracranial mass, hemorrhage, extra-axial fluid collection, or midline shift. Gray-white compartments appear normal. No acute infarct evident. Bony calvarium appears intact. The mastoid air cells are clear. No intraorbital lesions are evident. CT CERVICAL  SPINE FINDINGS There is no fracture or spondylolisthesis. Prevertebral soft tissues and predental space regions are normal. The disc spaces appear unremarkable. There is facet hypertrophy at several levels bilaterally. No nerve root edema or effacement is evident on this study. No disc extrusion or stenosis. IMPRESSION: CT head:  Study within normal limits. CT cervical spine: No fracture or spondylolisthesis. Relatively mild osteoarthritic changes noted at several levels. No nerve root edema or effacement is apparent. No disc extrusion or stenosis. Should be noted that assessment of the cervical cord is quite limited on noncontrast enhanced cervical spine CT. On most recent thoracolumbar from 2014, the patient did have paraspinal edema, not evident on current CT. It may be reasonable to consider MR correlation at this time, ideally pre and postcontrast, given the patient's symptoms and findings on prior MR examination from 2014. Electronically Signed   By: Lowella Grip III M.D.   On: 09/23/2015 13:54   I have personally reviewed and evaluated these images and lab results as part of my medical decision-making.   EKG Interpretation   Date/Time:  Thursday September 23 2015 12:23:23 EDT Ventricular Rate:  129 PR Interval:  171 QRS Duration: 96 QT Interval:  336 QTC Calculation: 492 R Axis:   86 Text Interpretation:  Sinus tachycardia Consider right atrial enlargement  Repol abnrm suggests ischemia, diffuse leads ST elevation avR diffuse ST  depression elsewhere Confirmed by Wyvonnia Dusky  MD, Katalyn Matin [phone]) on  09/23/2015 12:55:05 PM      MDM   Final diagnoses:  Hypokalemic periodic paralysis  Leukocytosis  Patient with progressive 2 day history of generalized weakness, unable to stand or walk. Has had a fall secondary to same. Did not hit her head.  On exam she is unable to hold either arm or  leg off the bed. She can wiggle her fingers and toes. She protects her face from falling arm. She is  tachycardic abnormal EKG with ST elevation in aVR and ST depression diffusely.  This was reviewed with Dr. Johnsie Cancel of cardiology. Suspects possible underlying atrial flutter. Patient with no chest pain or shortness of breath.  Labs show leukocytosis. Lactate normal.  Afebrile.   Potassium is 1.4. This may explain patient's EKG changes and may explain her extremity weakness. She is not on diuretics. We'll replete aggressively. Magnesium is normal. Discussed with Dr. Merlene Laughter of neurology who will evaluate patient. Does not recommend additional neuro imaging at this time.  CT head and C spine negative.  HR improved in the ED but blood pressure decreased to 80s. EKG changes improving. Additional IVF given.  WBC 28 but lactate normal. No evidence of infection. UA negative. CXR negative. No meningismus.  BP improved to 123456 systolic after additional hydration. Broad spectrum antibiotics given. Cultures obtained.  Cardiology and neurology have seen patient.  She does not appear septic. Agree with potassium replacement. Dr. Merlene Laughter does not recommend further neuro imaging. TSH low, maybe overmedicated. Mild troponin and CK elevation. No CP or SOB.  ICU admission d/w Dr. Nehemiah Settle.  CRITICAL CARE Performed by: Ezequiel Essex Total critical care time: 71minutes Critical care time was exclusive of separately billable procedures and treating other patients. Critical care was necessary to treat or prevent imminent or life-threatening deterioration. Critical care was time spent personally by me on the following activities: development of treatment plan with patient and/or surrogate as well as nursing, discussions with consultants, evaluation of patient's response to treatment, examination of patient, obtaining history from patient or surrogate, ordering and performing treatments and interventions, ordering and review of laboratory studies, ordering and review of radiographic studies, pulse oximetry and  re-evaluation of patient's condition.     Ezequiel Essex, MD 09/23/15 [phone]

## 2015-09-23 NOTE — ED Notes (Signed)
Pt can not stand for orthostatics.

## 2015-09-23 NOTE — ED Notes (Signed)
Report given to ICU nurse, Darius Bump, RN.

## 2015-09-23 NOTE — ED Notes (Signed)
Pt is able to move all limbs but unable to stand or hold herself up.  Weakness in all four peripheral limbs.

## 2015-09-23 NOTE — H&P (Addendum)
History and Physical  [name] [facility] [id number] DOB: [birth year] DOA: 09/23/2015  Referring physician: Dr Wyvonnia Dusky, ED physician PCP: Mickie Hillier, MD  Outpatient Specialists:  none  Chief Complaint: generalized weakness  HPI: Sharon Atkins is a 45 y.o. female with a history of hypothyroidism, hypertension, arthritis.  He was seen in the emergency department for acute onset of generalized weakness started yesterday and has worsened today today. She was unable to get up on her own and needed significant assistance to move about the house.  No peeling or provoking factors. She states that this is never happened before. She denies fevers, chills, nausea, vomiting, diarrhea. She is not on any diuretics. She denies taking diuretics or any other medication that is not hers.  She has not taken any over-the-counter medications or supplements. She denies headache, neck pain.  She does have some mild cramping pains in her extremities.   Review of Systems:   Pt denies any fevers, chills, nausea, vomiting, diarrhea, constipation, abdominal pain, shortness of breath, dyspnea on exertion, orthopnea, cough, wheezing, palpitations, headache, vision changes, lightheadedness, dizziness, melena, rectal bleeding.  Review of systems are otherwise negative  Past Medical History  Diagnosis Date  . Thyroid disease     Hypothyroidism  . Goiter   . Depression   . Hypertension   . Fibromyalgia   . Arthritis   . Elevated WBC count     unknown cause  . Anxiety   . Memory loss since 03/2012  . Chronic pain   . ADD (attention deficit disorder)   . Tobacco abuse   . Hypothyroidism 06/17/2015   Past Surgical History  Procedure Laterality Date  . Cesarean section  1995, 2001  . Partial hysterectomy  2009  . Abdominal hysterectomy    . Appendectomy    . Abdominal surgery    . Tonsillectomy  1998   Social History:  reports that she has been smoking Cigarettes.  She has a 5 pack-year smoking history.  She has never used smokeless tobacco. She reports that she does not drink alcohol or use illicit drugs. Patient lives at [place]  . Hct [Hydrochlorothiazide] Other (See Comments)    "affects potassium more than normal"  . Lyrica [Pregabalin] Other (See Comments)    "makes her feel stupid"    Family History  Problem Relation Age of Onset  . Hypertension Other     Parent     Prior to Admission medications   Medication Sig Start Date End Date Taking? Authorizing Provider  ibuprofen (ADVIL,MOTRIN) 200 MG tablet Take 200 mg by mouth every 6 (six) hours as needed for moderate pain.   Yes Historical Provider, MD  levothyroxine (SYNTHROID, LEVOTHROID) 112 MCG tablet Take 1 tablet (112 mcg total) by mouth daily. 08/30/15  Yes Mikey Kirschner, MD  lisinopril (PRINIVIL,ZESTRIL) 10 MG tablet Take 1 tablet (10 mg total) by mouth daily. 06/22/15  Yes Mikey Kirschner, MD  cefPROZIL (CEFZIL) 500 MG tablet Take 1 tablet (500 mg total) by mouth 2 (two) times daily. Patient not taking: Reported on 08/26/2015 07/29/15   Nilda Simmer, NP  HYDROcodone-acetaminophen [facility]) 10-325 MG tablet Take 1 tablet by mouth every 4 (four) hours as needed for severe pain. Patient not taking: Reported on 08/26/2015 07/29/15   Nilda Simmer, NP    Physical Exam: BP 102/81 mmHg  Pulse 73  Temp(Src) 98.6 F (37 C) (Rectal)  Resp 17  Ht 5' (1.524 m)  Wt [phone]  kg (145 lb)  BMI 28.32 kg/m2  SpO2 100%  General: Middle-aged Caucasian female. Awake and alert and oriented x3. No acute cardiopulmonary distress.  HEENT: Normocephalic atraumatic.  Right and left ears normal in appearance.  Pupils equal, round, reactive to light. Extraocular muscles are intact. Sclerae anicteric and noninjected.  Moist mucosal membranes. No mucosal lesions.  Neck: Neck supple without lymphadenopathy. No carotid bruits. No masses palpated.  Cardiovascular: Regular rate with normal S1-S2 sounds. No murmurs,  rubs, gallops auscultated. No JVD.  Respiratory: Good respiratory effort with no wheezes, rales, rhonchi. Lungs clear to auscultation bilaterally.  No accessory muscle use. Abdomen: Soft, nontender, nondistended. Active bowel sounds. No masses or hepatosplenomegaly  Skin: No rashes, lesions, or ulcerations.  Dry, warm to touch. 2+ dorsalis pedis and radial pulses. Musculoskeletal: No calf or leg pain. All major joints not erythematous nontender.  No upper or lower joint deformation.  Good ROM.  No contractures  Psychiatric: Intact judgment and insight. Pleasant and cooperative. Neurologic: Strength is globally decreased and is 1 out of 5 throughout.  Cranial nerves II through XII are grossly intact.           Labs on Admission: I have personally reviewed following labs and imaging studies  CBC:  Recent Labs Lab 09/23/15 1236  WBC 28.4*  NEUTROABS 23.6*  HGB 18.0*  HCT 49.3*  MCV 88.2  PLT 123456*   Basic Metabolic Panel:  Recent Labs Lab 09/23/15 1236  NA 140  K 1.4*  CL 113*  CO2 13*  GLUCOSE 160*  BUN 10  CREATININE 0.99  CALCIUM 10.4*  MG 2.2   GFR: Estimated Creatinine Clearance: 60.1 mL/min (by C-G formula based on Cr of 0.99). Liver Function Tests:  Recent Labs Lab 09/23/15 1242  AST 22  ALT 13*  ALKPHOS 84  BILITOT 0.9  PROT 8.7*  ALBUMIN 4.7   No results for input(s): LIPASE, AMYLASE in the last 168 hours. No results for input(s): AMMONIA in the last 168 hours. Coagulation Profile: No results for input(s): INR, PROTIME in the last 168 hours. Cardiac Enzymes:  Recent Labs Lab 09/23/15 1236  CKTOTAL 523*  TROPONINI 0.08*   BNP (last 3 results) No results for input(s): PROBNP in the last 8760 hours. HbA1C: No results for input(s): HGBA1C in the last 72 hours. CBG:  Recent Labs Lab 09/23/15 1222  GLUCAP 165*   Lipid Profile: No results for input(s): CHOL, HDL, LDLCALC, TRIG, CHOLHDL, LDLDIRECT in the last 72 hours. Thyroid Function  Tests:  Recent Labs  09/23/15 1520  TSH 0.232*   Anemia Panel: No results for input(s): VITAMINB12, FOLATE, FERRITIN, TIBC, IRON, RETICCTPCT in the last 72 hours. Urine analysis:    Component Value Date/Time   COLORURINE YELLOW 09/23/2015 1411   APPEARANCEUR CLEAR 09/23/2015 1411   LABSPEC <1.005* 09/23/2015 1411   PHURINE 6.5 09/23/2015 1411   GLUCOSEU NEGATIVE 09/23/2015 1411   HGBUR SMALL* 09/23/2015 1411   BILIRUBINUR NEGATIVE 09/23/2015 1411   KETONESUR NEGATIVE 09/23/2015 1411   PROTEINUR TRACE* 09/23/2015 1411   UROBILINOGEN 0.2 11/21/2013 0505   NITRITE NEGATIVE 09/23/2015 1411   LEUKOCYTESUR TRACE* 09/23/2015 1411   Sepsis Labs: @LABRCNTIP (procalcitonin:4,lacticidven:4) )No results found for this or any previous visit (from the past 240 hour(s)).   Radiological Exams on Admission: Dg Chest 1 View  09/23/2015  CLINICAL DATA:  Weakness since yesterday.  Unable to move. EXAM: CHEST 1 VIEW COMPARISON:  Chest x-rays dated 06/17/2015 and 05/17/2013. FINDINGS: Study is hypoinspiratory with crowding of the  perihilar and bibasilar bronchovascular markings. Given the low lung volumes, lungs are clear. No evidence of pneumonia. No pleural effusion or pneumothorax. Osseous and soft tissue structures about the chest are unremarkable. IMPRESSION: Low lung volumes.  No active disease. Electronically Signed   By: Franki Cabot M.D.   On: 09/23/2015 13:44   Ct Head Wo Contrast  09/23/2015  CLINICAL DATA:  Diffuse upper and lower extremity weakness. EXAM: CT HEAD WITHOUT CONTRAST CT CERVICAL SPINE WITHOUT CONTRAST TECHNIQUE: Multidetector CT imaging of the head and cervical spine was performed following the standard protocol without intravenous contrast. Multiplanar CT image reconstructions of the cervical spine were also generated. COMPARISON:  Head CT May 17, 2013; cervical MRI September 17, 2012; CT cervical spine September 16, 2012 FINDINGS: CT HEAD FINDINGS The ventricles are normal in size  and configuration. There is no intracranial mass, hemorrhage, extra-axial fluid collection, or midline shift. Gray-white compartments appear normal. No acute infarct evident. Bony calvarium appears intact. The mastoid air cells are clear. No intraorbital lesions are evident. CT CERVICAL SPINE FINDINGS There is no fracture or spondylolisthesis. Prevertebral soft tissues and predental space regions are normal. The disc spaces appear unremarkable. There is facet hypertrophy at several levels bilaterally. No nerve root edema or effacement is evident on this study. No disc extrusion or stenosis. IMPRESSION: CT head:  Study within normal limits. CT cervical spine: No fracture or spondylolisthesis. Relatively mild osteoarthritic changes noted at several levels. No nerve root edema or effacement is apparent. No disc extrusion or stenosis. Should be noted that assessment of the cervical cord is quite limited on noncontrast enhanced cervical spine CT. On most recent thoracolumbar from 2014, the patient did have paraspinal edema, not evident on current CT. It may be reasonable to consider MR correlation at this time, ideally pre and postcontrast, given the patient's symptoms and findings on prior MR examination from 2014. Electronically Signed   By: Lowella Grip III M.D.   On: 09/23/2015 13:54   Ct Cervical Spine Wo Contrast  09/23/2015  CLINICAL DATA:  Diffuse upper and lower extremity weakness. EXAM: CT HEAD WITHOUT CONTRAST CT CERVICAL SPINE WITHOUT CONTRAST TECHNIQUE: Multidetector CT imaging of the head and cervical spine was performed following the standard protocol without intravenous contrast. Multiplanar CT image reconstructions of the cervical spine were also generated. COMPARISON:  Head CT May 17, 2013; cervical MRI September 17, 2012; CT cervical spine September 16, 2012 FINDINGS: CT HEAD FINDINGS The ventricles are normal in size and configuration. There is no intracranial mass, hemorrhage, extra-axial fluid  collection, or midline shift. Gray-white compartments appear normal. No acute infarct evident. Bony calvarium appears intact. The mastoid air cells are clear. No intraorbital lesions are evident. CT CERVICAL SPINE FINDINGS There is no fracture or spondylolisthesis. Prevertebral soft tissues and predental space regions are normal. The disc spaces appear unremarkable. There is facet hypertrophy at several levels bilaterally. No nerve root edema or effacement is evident on this study. No disc extrusion or stenosis. IMPRESSION: CT head:  Study within normal limits. CT cervical spine: No fracture or spondylolisthesis. Relatively mild osteoarthritic changes noted at several levels. No nerve root edema or effacement is apparent. No disc extrusion or stenosis. Should be noted that assessment of the cervical cord is quite limited on noncontrast enhanced cervical spine CT. On most recent thoracolumbar from 2014, the patient did have paraspinal edema, not evident on current CT. It may be reasonable to consider MR correlation at this time, ideally pre and postcontrast, given the  patient's symptoms and findings on prior MR examination from 2014. Electronically Signed   By: Lowella Grip III M.D.   On: 09/23/2015 13:54    EKG: Independently reviewed. Sinus rhythm with low voltage  Assessment/Plan: Principal Problem:   Acute hypokalemia Active Problems:   Hypertension   Hypothyroidism   Weakness generalized   Sepsis (HCC)   Elevated troponin I level   Elevated CK    This patient was discussed with the ED physician, including pertinent vitals, physical exam findings, labs, and imaging.  We also discussed care given by the ED provider.  #1 acute hypokalemia  Admit to stepdown with telemetry  Unsure of etiology: No GI losses. No medication changes or apparent medication causes.   Check renal losses with urine sodium and potassium level  IV potassium 5 runs  Oral potassium 30 mEq 3 times a  day  Recheck potassium now and in 3 hours  Recheck potassium in the morning #2 generalized weakness  Likely secondary to acute hypokalemia  Neurology consulted - appreciate consult #3 sepsis  No source of leukocytosis - no source of infection, no fever.  No blasts  Check CBC in the morning  Broad-spectrum antibiotics started given patient's hypotension on arrival  Patient fluid resuscitated  Continue Vanco and Zosyn #4 hypothyroidism  Patient a little overcorrected - continue current dose #5 hypertension  Hold lisinopril #6 elevated troponin I level  Repeat troponin in 6 hours  Appreciate cardiology input #7 elevated CK  Repeat CK tonight and tomorrow morning  Possibly secondary to hypokalemia  Fluid hydrate  DVT prophylaxis: Lovenox Consultants: Cardiology, neurology Code Status: Full code Family Communication: Son in the room  Disposition Plan: Admission   Truett Mainland, DO [facility] Pager [phone]  If 7PM-7AM, please contact night-coverage [email or web address] Password [email or web address]

## 2015-09-23 NOTE — ED Notes (Signed)
Pt says has felt incredible weak since yesterday.  Reports today "unable to move."  Pt says she has fallen because she can't walk.  Pt also reports pain in both hips and r arm.

## 2015-09-24 ENCOUNTER — Encounter (HOSPITAL_COMMUNITY): Payer: Self-pay | Admitting: Cardiology

## 2015-09-24 ENCOUNTER — Inpatient Hospital Stay (HOSPITAL_COMMUNITY): Payer: Medicaid Other

## 2015-09-24 DIAGNOSIS — I10 Essential (primary) hypertension: Secondary | ICD-10-CM

## 2015-09-24 DIAGNOSIS — R9431 Abnormal electrocardiogram [ECG] [EKG]: Secondary | ICD-10-CM

## 2015-09-24 DIAGNOSIS — R531 Weakness: Secondary | ICD-10-CM

## 2015-09-24 DIAGNOSIS — R7989 Other specified abnormal findings of blood chemistry: Secondary | ICD-10-CM

## 2015-09-24 DIAGNOSIS — E876 Hypokalemia: Principal | ICD-10-CM

## 2015-09-24 DIAGNOSIS — R079 Chest pain, unspecified: Secondary | ICD-10-CM

## 2015-09-24 DIAGNOSIS — E039 Hypothyroidism, unspecified: Secondary | ICD-10-CM

## 2015-09-24 DIAGNOSIS — R748 Abnormal levels of other serum enzymes: Secondary | ICD-10-CM

## 2015-09-24 LAB — BASIC METABOLIC PANEL
ANION GAP: 4 — AB (ref 5–15)
Anion gap: 4 — ABNORMAL LOW (ref 5–15)
BUN: 7 mg/dL (ref 6–20)
BUN: 9 mg/dL (ref 6–20)
CHLORIDE: 122 mmol/L — AB (ref 101–111)
CHLORIDE: 123 mmol/L — AB (ref 101–111)
CO2: 17 mmol/L — ABNORMAL LOW (ref 22–32)
CO2: 17 mmol/L — AB (ref 22–32)
Calcium: 7.7 mg/dL — ABNORMAL LOW (ref 8.9–10.3)
Calcium: 8.5 mg/dL — ABNORMAL LOW (ref 8.9–10.3)
Creatinine, Ser: 0.94 mg/dL (ref 0.44–1.00)
Creatinine, Ser: 1.01 mg/dL — ABNORMAL HIGH (ref 0.44–1.00)
GFR calc Af Amer: >60 mL/min (ref >60)
GFR calc non Af Amer: >60 mL/min (ref >60)
GFR calc non Af Amer: >60 mL/min (ref >60)
GLUCOSE: 146 mg/dL — AB (ref 65–99)
Glucose, Bld: 153 mg/dL — ABNORMAL HIGH (ref 65–99)
POTASSIUM: 1.4 mmol/L — AB (ref 3.5–5.1)
Potassium: 1.5 mmol/L — CL (ref 3.5–5.1)
Sodium: 144 mmol/L (ref 135–145)
Sodium: 143 mmol/L (ref 135–145)

## 2015-09-24 LAB — ECHOCARDIOGRAM COMPLETE
EERAT: 8.15
EWDT: 236 ms
FS: 44 % (ref 28–44)
Height: 60 in
IVS/LV PW RATIO, ED: 1.13
LA ID, A-P, ES: 35 mm
LA vol: 34 mL
LAVOLA4C: 33.8 mL
LDCA: 3.14 cm2
LEFT ATRIUM END SYS DIAM: 35 mm
LV E/e' medial: 8.15
LV E/e'average: 8.15
LV TDI E'LATERAL: 13.5
LV TDI E'MEDIAL: 9.46
LVELAT: 13.5 cm/s
LVOTD: 20 mm
MV Dec: 236
MV pk A vel: 94.6 m/s
MV pk E vel: 110 m/s
MVPG: 5 mmHg
PW: 10.1 mm — AB (ref 0.6–1.1)
RV TAPSE: 23.5 mm
Reg peak vel: 200 cm/s
TR max vel: 200 cm/s
Weight: 2306.89 oz

## 2015-09-24 LAB — VITAMIN B12: VITAMIN B 12: 147 pg/mL — AB (ref 180–914)

## 2015-09-24 LAB — C-REACTIVE PROTEIN: CRP: 3.4 mg/dL — AB (ref <1.0)

## 2015-09-24 LAB — CK: Total CK: 263 U/L — ABNORMAL HIGH (ref 38–234)

## 2015-09-24 LAB — MAGNESIUM
MAGNESIUM: 1.8 mg/dL (ref 1.7–2.4)
Magnesium: 1.8 mg/dL (ref 1.7–2.4)

## 2015-09-24 LAB — TROPONIN I: Troponin I: 0.08 ng/mL — ABNORMAL HIGH (ref <0.031)

## 2015-09-24 LAB — POTASSIUM: POTASSIUM: 2.3 mmol/L — AB (ref 3.5–5.1)

## 2015-09-24 LAB — SEDIMENTATION RATE: Sed Rate: 10 mm/hr (ref 0–22)

## 2015-09-24 MED ORDER — MORPHINE SULFATE (PF) 2 MG/ML IV SOLN
2.0000 mg | Freq: Once | INTRAVENOUS | Status: AC
  Administered 2015-09-24: 2 mg via INTRAVENOUS
  Filled 2015-09-24: qty 1

## 2015-09-24 MED ORDER — SODIUM CHLORIDE 0.9 % IV SOLN
1.0000 g | Freq: Once | INTRAVENOUS | Status: AC
  Administered 2015-09-24: 1 g via INTRAVENOUS
  Filled 2015-09-24: qty 10

## 2015-09-24 MED ORDER — ZOLPIDEM TARTRATE 5 MG PO TABS
5.0000 mg | ORAL_TABLET | Freq: Every evening | ORAL | Status: DC | PRN
  Administered 2015-09-25 – 2015-09-27 (×4): 5 mg via ORAL
  Filled 2015-09-24 (×4): qty 1

## 2015-09-24 MED ORDER — POTASSIUM CHLORIDE IN NACL 40-0.9 MEQ/L-% IV SOLN
INTRAVENOUS | Status: DC
  Administered 2015-09-24: 135 mL/h via INTRAVENOUS
  Administered 2015-09-24: 100 mL/h via INTRAVENOUS
  Administered 2015-09-24 – 2015-09-25 (×2): 135 mL/h via INTRAVENOUS

## 2015-09-24 MED ORDER — POTASSIUM CHLORIDE 10 MEQ/100ML IV SOLN
10.0000 meq | INTRAVENOUS | Status: AC
  Administered 2015-09-24 (×6): 10 meq via INTRAVENOUS
  Filled 2015-09-24 (×6): qty 100

## 2015-09-24 MED ORDER — MORPHINE SULFATE (PF) 2 MG/ML IV SOLN
2.0000 mg | INTRAVENOUS | Status: DC | PRN
  Administered 2015-09-24 – 2015-09-28 (×5): 2 mg via INTRAVENOUS
  Filled 2015-09-24 (×5): qty 1

## 2015-09-24 MED ORDER — POTASSIUM CHLORIDE CRYS ER 20 MEQ PO TBCR
40.0000 meq | EXTENDED_RELEASE_TABLET | ORAL | Status: AC
  Administered 2015-09-24 (×3): 40 meq via ORAL
  Filled 2015-09-24 (×3): qty 2

## 2015-09-24 MED ORDER — LORAZEPAM 2 MG/ML IJ SOLN
1.0000 mg | INTRAMUSCULAR | Status: DC | PRN
  Administered 2015-09-24 – 2015-09-27 (×3): 1 mg via INTRAVENOUS
  Filled 2015-09-24 (×3): qty 1

## 2015-09-24 MED ORDER — HYDROCODONE-ACETAMINOPHEN 5-325 MG PO TABS
1.0000 | ORAL_TABLET | ORAL | Status: DC | PRN
  Administered 2015-09-24 – 2015-09-28 (×22): 1 via ORAL
  Filled 2015-09-24 (×22): qty 1

## 2015-09-24 MED ORDER — SODIUM BICARBONATE 650 MG PO TABS
1300.0000 mg | ORAL_TABLET | Freq: Four times a day (QID) | ORAL | Status: DC
  Administered 2015-09-24 – 2015-09-26 (×8): 1300 mg via ORAL
  Filled 2015-09-24 (×8): qty 2

## 2015-09-24 MED ORDER — POTASSIUM CHLORIDE 10 MEQ/100ML IV SOLN
10.0000 meq | INTRAVENOUS | Status: AC
  Administered 2015-09-24 (×2): 10 meq via INTRAVENOUS
  Filled 2015-09-24 (×2): qty 100

## 2015-09-24 MED ORDER — POTASSIUM CHLORIDE CRYS ER 20 MEQ PO TBCR
40.0000 meq | EXTENDED_RELEASE_TABLET | Freq: Once | ORAL | Status: AC
  Administered 2015-09-24: 40 meq via ORAL
  Filled 2015-09-24: qty 2

## 2015-09-24 NOTE — Progress Notes (Signed)
PROGRESS NOTE    Sharon Atkins  [id number] DOB: February 28, 1971 DOA: 09/23/2015 PCP: Mickie Hillier, MD Outpatient Specialists:    Brief Narrative: 3 yof presented to the ED for generalized weakness. She was found to have severe hypokalemia at 1.5. She is found to be hypotensive and was admitted to stepdown. Neurology evaluated the patient felt a weakness was related to hypokalemia. She had a mild elevation of troponin. Cardiology saw the patient felt it was demand ischemia. Endocrinology has been consulted to help further workup hypokalemia. Will continue to monitor.   Assessment & Plan:   Principal Problem:   Acute hypokalemia Active Problems:   Hypertension   Hypothyroidism   Weakness generalized   Sepsis (HCC)   Elevated troponin I level   Elevated CK  1. Acute hypokalemia. Etiology unclear as she has not had any significant GI losses and she is not on any diuretics. Urine sodium 32, urine potassium 9. Continue to replete and monitor. She does have a history of hypothyroidism and serum TSH is mildly depressed with mildly elevated T4. Will request an endocrinology consult for further input 2. Generalized weakness. Likely secondary to acute hypokalemia. Neurology has been consulted and agrees with potassium supplementation. Check TSH found to be low at 0.232. 3. Hypotension. Likely related to volume depletion, improved with hydration. Initially there were concerns for sepsis and she was started on vancomycin and Zosyn. She's been afebrile, normal lactic acid and no leukocytosis. No obvious focus of infection. We'll discontinue antibiotics for now and observe.. 4. History of hypothyroidism. T4 elevated at 1.62. TSH found to be low at 0.232. Likely overcorrected with supplementation. Endocrinology consult 5. Hypertension. Hold lisinporil currently on hold due to borderline blood pressures.. 6. Elevated troponin. Cardiology input appreciated. Likely demand ischemia. Echocardiogram  pending.    DVT prophylaxis: Lovenox Code Status: Full Family Communication: discussed with patient at the bedside Disposition Plan: Discharge home once improved.   Consultants:   Neurology  Cardiology  Procedures:   none  Antimicrobials:   Vancomycin 6/15 >>6/16  Zosyn 6/15 >> 6/16   Subjective: Still feels weak and having difficulty moving limbs.  Objective: Filed Vitals:   09/24/15 0200 09/24/15 0300 09/24/15 0400 09/24/15 0500  BP: 113/78 104/69 97/61 104/70  Pulse: 77 80 81 83  Temp:      TempSrc:      Resp: 22 24 22 28   Height:      Weight:      SpO2: 100% 100% 97% 98%    Intake/Output Summary (Last 24 hours) at 09/24/15 0540 Last data filed at 09/24/15 0500  Gross per 24 hour  Intake 621.67 ml  Output      0 ml  Net 621.67 ml   Filed Weights   09/23/15 1213 09/23/15 1509 09/23/15 1852  Weight: 65.772 kg (145 lb) 65.772 kg (145 lb) 65.4 kg (144 lb 2.9 oz)    Examination:  General exam: Appears calm and comfortable  Respiratory system: Clear to auscultation. Respiratory effort normal. Cardiovascular system: S1 & S2 heard, RRR. No JVD, murmurs, rubs, gallops or clicks. No pedal edema. Gastrointestinal system: Abdomen is nondistended, soft and nontender. No organomegaly or masses felt. Normal bowel sounds heard. Central nervous system: Alert and oriented. Diffuse limb weakness Extremities: bilateral weakness. Skin: No rashes, lesions or ulcers Psychiatry: Judgement and insight appear normal. Mood & affect appropriate.     Data Reviewed: I have personally reviewed following labs and imaging studies  CBC:  Recent Labs Lab 09/23/15 1236  WBC 28.4*  NEUTROABS 23.6*  HGB 18.0*  HCT 49.3*  MCV 88.2  PLT 123456*   Basic Metabolic Panel:  Recent Labs Lab 09/23/15 1236 09/23/15 2242 09/24/15 0041  NA 140 144 144  K 1.4* 1.6* 1.4*  CL 113* 124* 123*  CO2 13* 16* 17*  GLUCOSE 160* 125* 146*  BUN 10 10 9   CREATININE 0.99 0.95 0.94    CALCIUM 10.4* 7.6* 7.7*  MG 2.2  --  1.8   GFR: Estimated Creatinine Clearance: 63.2 mL/min (by C-G formula based on Cr of 0.94). Liver Function Tests:  Recent Labs Lab 09/23/15 1242  AST 22  ALT 13*  ALKPHOS 84  BILITOT 0.9  PROT 8.7*  ALBUMIN 4.7   No results for input(s): LIPASE, AMYLASE in the last 168 hours. No results for input(s): AMMONIA in the last 168 hours. Coagulation Profile: No results for input(s): INR, PROTIME in the last 168 hours. Cardiac Enzymes:  Recent Labs Lab 09/23/15 1236 09/23/15 2242  CKTOTAL 523* 331*  TROPONINI 0.08* 0.13*   BNP (last 3 results) No results for input(s): PROBNP in the last 8760 hours. HbA1C: No results for input(s): HGBA1C in the last 72 hours. CBG:  Recent Labs Lab 09/23/15 1222  GLUCAP 165*   Lipid Profile: No results for input(s): CHOL, HDL, LDLCALC, TRIG, CHOLHDL, LDLDIRECT in the last 72 hours. Thyroid Function Tests:  Recent Labs  09/23/15 1245 09/23/15 1520  TSH  --  0.232*  FREET4 1.62*  --    Anemia Panel: No results for input(s): VITAMINB12, FOLATE, FERRITIN, TIBC, IRON, RETICCTPCT in the last 72 hours. Urine analysis:    Component Value Date/Time   COLORURINE YELLOW 09/23/2015 1411   APPEARANCEUR CLEAR 09/23/2015 1411   LABSPEC <1.005* 09/23/2015 1411   PHURINE 6.5 09/23/2015 1411   GLUCOSEU NEGATIVE 09/23/2015 1411   HGBUR SMALL* 09/23/2015 1411   BILIRUBINUR NEGATIVE 09/23/2015 1411   KETONESUR NEGATIVE 09/23/2015 1411   PROTEINUR TRACE* 09/23/2015 1411   UROBILINOGEN 0.2 11/21/2013 0505   NITRITE NEGATIVE 09/23/2015 1411   LEUKOCYTESUR TRACE* 09/23/2015 1411   Sepsis Labs: @LABRCNTIP (procalcitonin:4,lacticidven:4)  ) Recent Results (from the past 240 hour(s))  MRSA PCR Screening     Status: None   Collection Time: 09/23/15  7:00 PM  Result Value Ref Range Status   MRSA by PCR NEGATIVE NEGATIVE Final    Comment:        The GeneXpert MRSA Assay (FDA approved for NASAL  specimens only), is one component of a comprehensive MRSA colonization surveillance program. It is not intended to diagnose MRSA infection nor to guide or monitor treatment for MRSA infections.          Radiology Studies: Dg Chest 1 View  09/23/2015  CLINICAL DATA:  Weakness since yesterday.  Unable to move. EXAM: CHEST 1 VIEW COMPARISON:  Chest x-rays dated 06/17/2015 and 05/17/2013. FINDINGS: Study is hypoinspiratory with crowding of the perihilar and bibasilar bronchovascular markings. Given the low lung volumes, lungs are clear. No evidence of pneumonia. No pleural effusion or pneumothorax. Osseous and soft tissue structures about the chest are unremarkable. IMPRESSION: Low lung volumes.  No active disease. Electronically Signed   By: Franki Cabot M.D.   On: 09/23/2015 13:44   Ct Head Wo Contrast  09/23/2015  CLINICAL DATA:  Diffuse upper and lower extremity weakness. EXAM: CT HEAD WITHOUT CONTRAST CT CERVICAL SPINE WITHOUT CONTRAST TECHNIQUE: Multidetector CT imaging of the head and cervical spine was performed following the standard protocol without intravenous contrast.  Multiplanar CT image reconstructions of the cervical spine were also generated. COMPARISON:  Head CT May 17, 2013; cervical MRI September 17, 2012; CT cervical spine September 16, 2012 FINDINGS: CT HEAD FINDINGS The ventricles are normal in size and configuration. There is no intracranial mass, hemorrhage, extra-axial fluid collection, or midline shift. Gray-white compartments appear normal. No acute infarct evident. Bony calvarium appears intact. The mastoid air cells are clear. No intraorbital lesions are evident. CT CERVICAL SPINE FINDINGS There is no fracture or spondylolisthesis. Prevertebral soft tissues and predental space regions are normal. The disc spaces appear unremarkable. There is facet hypertrophy at several levels bilaterally. No nerve root edema or effacement is evident on this study. No disc extrusion or  stenosis. IMPRESSION: CT head:  Study within normal limits. CT cervical spine: No fracture or spondylolisthesis. Relatively mild osteoarthritic changes noted at several levels. No nerve root edema or effacement is apparent. No disc extrusion or stenosis. Should be noted that assessment of the cervical cord is quite limited on noncontrast enhanced cervical spine CT. On most recent thoracolumbar from 2014, the patient did have paraspinal edema, not evident on current CT. It may be reasonable to consider MR correlation at this time, ideally pre and postcontrast, given the patient's symptoms and findings on prior MR examination from 2014. Electronically Signed   By: Lowella Grip III M.D.   On: 09/23/2015 13:54   Ct Cervical Spine Wo Contrast  09/23/2015  CLINICAL DATA:  Diffuse upper and lower extremity weakness. EXAM: CT HEAD WITHOUT CONTRAST CT CERVICAL SPINE WITHOUT CONTRAST TECHNIQUE: Multidetector CT imaging of the head and cervical spine was performed following the standard protocol without intravenous contrast. Multiplanar CT image reconstructions of the cervical spine were also generated. COMPARISON:  Head CT May 17, 2013; cervical MRI September 17, 2012; CT cervical spine September 16, 2012 FINDINGS: CT HEAD FINDINGS The ventricles are normal in size and configuration. There is no intracranial mass, hemorrhage, extra-axial fluid collection, or midline shift. Gray-white compartments appear normal. No acute infarct evident. Bony calvarium appears intact. The mastoid air cells are clear. No intraorbital lesions are evident. CT CERVICAL SPINE FINDINGS There is no fracture or spondylolisthesis. Prevertebral soft tissues and predental space regions are normal. The disc spaces appear unremarkable. There is facet hypertrophy at several levels bilaterally. No nerve root edema or effacement is evident on this study. No disc extrusion or stenosis. IMPRESSION: CT head:  Study within normal limits. CT cervical spine: No  fracture or spondylolisthesis. Relatively mild osteoarthritic changes noted at several levels. No nerve root edema or effacement is apparent. No disc extrusion or stenosis. Should be noted that assessment of the cervical cord is quite limited on noncontrast enhanced cervical spine CT. On most recent thoracolumbar from 2014, the patient did have paraspinal edema, not evident on current CT. It may be reasonable to consider MR correlation at this time, ideally pre and postcontrast, given the patient's symptoms and findings on prior MR examination from 2014. Electronically Signed   By: Lowella Grip III M.D.   On: 09/23/2015 13:54        Scheduled Meds: . enoxaparin (LOVENOX) injection  40 mg Subcutaneous Q24H  . levothyroxine  112 mcg Oral QAC breakfast  . piperacillin-tazobactam (ZOSYN)  IV  3.375 g Intravenous Q8H  . potassium chloride  30 mEq Oral TID  . vancomycin  750 mg Intravenous Q12H   Continuous Infusions: . 0.9 % NaCl with KCl 40 mEq / L 100 mL/hr (09/24/15 0247)  LOS: 1 day    Time spent: 25 minutes   Kathie Dike, MD [facility] Pager [phone]  If 7PM-7AM, please contact night-coverage [email or web address] Password [email or web address] 09/24/2015, 5:40 AM

## 2015-09-24 NOTE — Consult Note (Signed)
Progress Note  Consulting Cardiologist: Dr. Jenkins Rouge  Reason for follow-up: Abnormal ECG  Subjective -------------------- Complains of weakness and muscle soreness. No chest tightness or breathlessness. No palpitations.  Objective ----------------------  Medications Scheduled Medications: . enoxaparin (LOVENOX) injection  40 mg Subcutaneous Q24H  . levothyroxine  112 mcg Oral QAC breakfast  . piperacillin-tazobactam (ZOSYN)  IV  3.375 g Intravenous Q8H  . potassium chloride  30 mEq Oral TID  . vancomycin  750 mg Intravenous Q12H     Infusions: . 0.9 % NaCl with KCl 40 mEq / L 100 mL/hr (09/24/15 0247)     PRN Medications:  sodium chloride, acetaminophen, HYDROcodone-acetaminophen, ondansetron (ZOFRAN) IV  Physical Examination Blood pressure 104/70, pulse 83, temperature 97.4 F (36.3 C), temperature source Oral, resp. rate 28, height 5' (1.524 m), weight 144 lb 2.9 oz (65.4 kg), SpO2 98 %.  Intake/Output Summary (Last 24 hours) at 09/24/15 0727 Last data filed at 09/24/15 0615  Gross per 24 hour  Intake 1871.67 ml  Output   3600 ml  Net -1728.33 ml   Telemetry: NSR  GEN: No acute distress  HEENT: Conjunctiva and lids normal, oropharynx clear. Neck: Supple, no elevated JVP or carotid bruits, no thyromegaly. Lungs: Clear to auscultation, nonlabored breathing at rest. Cardiac: Regular rate and rhythm, no S3 or significant systolic murmur, no pericardial rub. Abdomen: Soft, nontender, bowel sounds present, no guarding or rebound. Extremities: No pitting edema, distal pulses 2+. Skin: Warm and dry. Musculoskeletal: No kyphosis. Neuropsychiatric: Alert and oriented x3, affect grossly appropriate. Diffuse muscle weakness.  Prior Cardiac Testing/Procedures  1, Echocardiogram 09/17/2012 Left ventricle: The cavity size was normal. Wall thickness was normal. Systolic function was normal. The estimated ejection fraction was in the range of 60% to 65%.  Wall motion was normal; there were no regional wall motion abnormalities. Left ventricular diastolic function parameters were normal. - Mitral valve: Mild regurgitation.  Lab Results  Basic Metabolic Panel:  Recent Labs Lab 09/23/15 1236 09/23/15 2242 09/24/15 0041  NA 140 144 144  K 1.4* 1.6* 1.4*  CL 113* 124* 123*  CO2 13* 16* 17*  GLUCOSE 160* 125* 146*  BUN 10 10 9   CREATININE 0.99 0.95 0.94  CALCIUM 10.4* 7.6* 7.7*  MG 2.2  --  1.8    Liver Function Tests:  Recent Labs Lab 09/23/15 1242  AST 22  ALT 13*  ALKPHOS 84  BILITOT 0.9  PROT 8.7*  ALBUMIN 4.7    CBC:  Recent Labs Lab 09/23/15 1236  WBC 28.4*  NEUTROABS 23.6*  HGB 18.0*  HCT 49.3*  MCV 88.2  PLT 443*    Cardiac Enzymes:  Recent Labs Lab 09/23/15 1236 09/23/15 2242  CKTOTAL 523* 331*  TROPONINI 0.08* 0.13*    Radiology: Dg Chest 1 View  09/23/2015  CLINICAL DATA:  Weakness since yesterday.  Unable to move. EXAM: CHEST 1 VIEW COMPARISON:  Chest x-rays dated 06/17/2015 and 05/17/2013. FINDINGS: Study is hypoinspiratory with crowding of the perihilar and bibasilar bronchovascular markings. Given the low lung volumes, lungs are clear. No evidence of pneumonia. No pleural effusion or pneumothorax. Osseous and soft tissue structures about the chest are unremarkable. IMPRESSION: Low lung volumes.  No active disease. Electronically Signed   By: Franki Cabot M.D.   On: 09/23/2015 13:44   Ct Head Wo Contrast  09/23/2015  CLINICAL DATA:  Diffuse upper and lower extremity weakness. EXAM: CT HEAD WITHOUT CONTRAST CT CERVICAL SPINE WITHOUT CONTRAST TECHNIQUE: Multidetector CT imaging of the  head and cervical spine was performed following the standard protocol without intravenous contrast. Multiplanar CT image reconstructions of the cervical spine were also generated. COMPARISON:  Head CT May 17, 2013; cervical MRI September 17, 2012; CT cervical spine September 16, 2012 FINDINGS: CT HEAD FINDINGS The  ventricles are normal in size and configuration. There is no intracranial mass, hemorrhage, extra-axial fluid collection, or midline shift. Gray-white compartments appear normal. No acute infarct evident. Bony calvarium appears intact. The mastoid air cells are clear. No intraorbital lesions are evident. CT CERVICAL SPINE FINDINGS There is no fracture or spondylolisthesis. Prevertebral soft tissues and predental space regions are normal. The disc spaces appear unremarkable. There is facet hypertrophy at several levels bilaterally. No nerve root edema or effacement is evident on this study. No disc extrusion or stenosis. IMPRESSION: CT head:  Study within normal limits. CT cervical spine: No fracture or spondylolisthesis. Relatively mild osteoarthritic changes noted at several levels. No nerve root edema or effacement is apparent. No disc extrusion or stenosis. Should be noted that assessment of the cervical cord is quite limited on noncontrast enhanced cervical spine CT. On most recent thoracolumbar from 2014, the patient did have paraspinal edema, not evident on current CT. It may be reasonable to consider MR correlation at this time, ideally pre and postcontrast, given the patient's symptoms and findings on prior MR examination from 2014. Electronically Signed   By: Lowella Grip III M.D.   On: 09/23/2015 13:54   Ct Cervical Spine Wo Contrast  09/23/2015  CLINICAL DATA:  Diffuse upper and lower extremity weakness. EXAM: CT HEAD WITHOUT CONTRAST CT CERVICAL SPINE WITHOUT CONTRAST TECHNIQUE: Multidetector CT imaging of the head and cervical spine was performed following the standard protocol without intravenous contrast. Multiplanar CT image reconstructions of the cervical spine were also generated. COMPARISON:  Head CT May 17, 2013; cervical MRI September 17, 2012; CT cervical spine September 16, 2012 FINDINGS: CT HEAD FINDINGS The ventricles are normal in size and configuration. There is no intracranial mass,  hemorrhage, extra-axial fluid collection, or midline shift. Gray-white compartments appear normal. No acute infarct evident. Bony calvarium appears intact. The mastoid air cells are clear. No intraorbital lesions are evident. CT CERVICAL SPINE FINDINGS There is no fracture or spondylolisthesis. Prevertebral soft tissues and predental space regions are normal. The disc spaces appear unremarkable. There is facet hypertrophy at several levels bilaterally. No nerve root edema or effacement is evident on this study. No disc extrusion or stenosis. IMPRESSION: CT head:  Study within normal limits. CT cervical spine: No fracture or spondylolisthesis. Relatively mild osteoarthritic changes noted at several levels. No nerve root edema or effacement is apparent. No disc extrusion or stenosis. Should be noted that assessment of the cervical cord is quite limited on noncontrast enhanced cervical spine CT. On most recent thoracolumbar from 2014, the patient did have paraspinal edema, not evident on current CT. It may be reasonable to consider MR correlation at this time, ideally pre and postcontrast, given the patient's symptoms and findings on prior MR examination from 2014. Electronically Signed   By: Lowella Grip III M.D.   On: 09/23/2015 13:54    ECG:  Follow-up tracing from 09/23/2015 shows sinus rhythm with borderline low voltage, upper normal of QT interval.  Impression and Recommendations --------------  1. Abnormal ECG: Likely related to hypokalemia She is being given potassium replacement and IV fluids per primary team. Heart rate has normalized and prior ST T-wave abnormalities have improved. Will check echocardiogram for LV function.  2. Elevated troponin I: Minor elevation, most likely secondary to physiologic stress in the setting of severe hypokalemia and tachycardia. At this point doubt ACS.  3. Marked Hypokalemia: She denies viral illness, dehydration, NVD. Not on diuretic at home. Has history of  hypothyroidism on Synthroid, but not hyperthyroidism to suggest thyrotoxicosis. CK is mildly increased but not consistent with rhabdomyolysis. She states that she fell yesterday, but no prolonged down events. Etiology is unclear at this point, would recommend endocrinology consultation.  3. Hypertension: BP is stable. Off antihypertensives at this time.   4, Hypothyroidism: She is being treated by PCP with Synthroid. TSH 0.232.  Signed: Phill Myron. Lawrence NP [place]  09/24/2015, 7:27 AM Co-Sign MD  Attending note:  Patient seen and examined. Reviewed consultation note by Dr. Johnsie Cancel from yesterday afternoon. I modified the above noted by Ms. Lawrence NP. Ms. Authement is admitted to the  hospital with severe weakness, states she had a fall yesterday. She is noted to have marked hypokalemia with potassium around 1.5. Our service was consulted due to tachycardia with abnormal ECG, minor increase in troponin I as well. I reviewed her tracings, most likely was sinus tachycardia originally with ST segment depression and prolonged QT interval with T-wave flattening which would be most consistent with hypokalemia. With IV fluids and ongoing potassium replacement (potassium has not increased much as yet however), heart rate has come down and ECG changes improving. She does not report any angina pain. No history of CAD or cardiomyopathy.  On examination his morning she remains weak, some muscle soreness reported. Afebrile, systolic blood pressure [id number], heart rate in the 80s in sinus rhythm by telemetry. She has had no significant ventricular arrhythmias. Lungs are clear and nonlabored, cardiac exam with RRR no gallop. Lab work shows potassium 1.5, chloride 122, creatinine 1.0, low anion gap, total CK 263, troponin I 0.13 down to 0.08, TSH 0.232.  Abnormal ECG most likely consistent with severe hypokalemia. Minor increase in troponin I is noted in the setting of physiologic stress and severe hypokalemia, doubt  ACS at this point. We will obtain an echocardiogram to verify normal LVEF and wall motion. Main concern would seem to be diagnosis of etiology of her hypokalemia. Determination of whether this is a potassium loss versus redistribution phenomena will be important in terms of management. She has a history of hypothyroidism on Synthroid, but not thyrotoxicosis. She is not on diuretics at home, has had no recent GI loss such as nausea or vomiting, no diarrhea. She did have a fall yesterday, but no clear evidence of rhabdomyolysis based on her low-level CK elevation. Would recommend consultation with endocrinology to help clarify diagnosis. Potassium repletion continues per primary team.  Satira Sark, M.D., F.A.C.C.

## 2015-09-24 NOTE — Consult Note (Signed)
Reason for Consult: Hypokalemia Referring Physician: Dr. Lytle [facility] is an 45 y.o. female.  HPI: She is a patient worse history of hypertension, hypothyroidism, fibromyalgia and history of hypokalemia presently came with complaints of weakness, difficulty walking for the last couple of days. Patient stated that she has been admitted multiple times with hypokalemia but not as bad as his. Presently she denies any nausea, vomiting, diarrhea. Patient also denies any use of diuretics. Recently she had episode of urinary tract infection. Presently except her regular prescription she is not taking anything. When patient was evaluated she was found to have hypokalemia and hypotensive hence admitted to the hospital.  Past Medical History  Diagnosis Date  . Goiter   . Depression   . Essential hypertension   . Fibromyalgia   . Arthritis   . Anxiety   . Memory loss Since 03/2012  . Chronic pain   . ADD (attention deficit disorder)   . Hypothyroidism 06/17/2015    Past Surgical History  Procedure Laterality Date  . Cesarean section  1995, 2001  . Partial hysterectomy  2009  . Abdominal hysterectomy    . Appendectomy    . Abdominal surgery    . Tonsillectomy  1998    Family History  Problem Relation Age of Onset  . Hypertension Other     Parent    Social History:  reports that she has been smoking Cigarettes.  She has a 5 pack-year smoking history. She has never used smokeless tobacco. She reports that she does not drink alcohol or use illicit drugs.  Allergies:  Allergies  Allergen Reactions  . Hct [Hydrochlorothiazide] Other (See Comments)    "affects potassium more than normal"  . Lyrica [Pregabalin] Other (See Comments)    "makes her feel stupid"    Medications: I have reviewed the patient's current medications.  Results for orders placed or performed during the hospital encounter of 09/23/15 (from the past 48 hour(s))  CBG monitoring, ED     Status: Abnormal    Collection Time: 09/23/15 12:22 PM  Result Value Ref Range   Glucose-Capillary 165 (H) 65 - 99 mg/dL  CBC with Differential     Status: Abnormal   Collection Time: 09/23/15 12:36 PM  Result Value Ref Range   WBC 28.4 (H) 4.0 - 10.5 K/uL   RBC 5.59 (H) 3.87 - 5.11 MIL/uL   Hemoglobin 18.0 (H) 12.0 - 15.0 g/dL   HCT 49.3 (H) 36.0 - 46.0 %   MCV 88.2 78.0 - 100.0 fL   MCH 32.2 26.0 - 34.0 pg   MCHC 36.5 (H) 30.0 - 36.0 g/dL   RDW 14.5 11.5 - 15.5 %   Platelets 443 (H) 150 - 400 K/uL   Neutrophils Relative % 83 %   Lymphocytes Relative 12 %   Monocytes Relative 4 %   Eosinophils Relative 1 %   Basophils Relative 0 %   Neutro Abs 23.6 (H) 1.7 - 7.7 K/uL   Lymphs Abs 3.4 0.7 - 4.0 K/uL   Monocytes Absolute 1.1 (H) 0.1 - 1.0 K/uL   Eosinophils Absolute 0.3 0.0 - 0.7 K/uL   Basophils Absolute 0.0 0.0 - 0.1 K/uL   WBC Morphology MILD LEFT SHIFT (1-5% METAS, OCC MYELO, OCC BANDS)   Basic metabolic panel     Status: Abnormal   Collection Time: 09/23/15 12:36 PM  Result Value Ref Range   Sodium 140 135 - 145 mmol/L   Potassium 1.4 (LL) 3.5 - 5.1 mmol/L  Comment: CRITICAL RESULT CALLED TO, READ BACK BY AND VERIFIED WITH: MARTIN,S AT 1331 ON 09/23/2015 BY ISLEY,B    Chloride 113 (H) 101 - 111 mmol/L   CO2 13 (L) 22 - 32 mmol/L   Glucose, Bld 160 (H) 65 - 99 mg/dL   BUN 10 6 - 20 mg/dL   Creatinine, Ser 0.99 0.44 - 1.00 mg/dL   Calcium 10.4 (H) 8.9 - 10.3 mg/dL   GFR calc non Af Amer >60 >60 mL/min   GFR calc Af Amer >60 >60 mL/min    Comment: (NOTE) The eGFR has been calculated using the CKD EPI equation. This calculation has not been validated in all clinical situations. eGFR's persistently <60 mL/min signify possible Chronic Kidney Disease.    Anion gap 14 5 - 15  CK     Status: Abnormal   Collection Time: 09/23/15 12:36 PM  Result Value Ref Range   Total CK 523 (H) 38 - 234 U/L  Troponin I     Status: Abnormal   Collection Time: 09/23/15 12:36 PM  Result Value Ref Range    Troponin I 0.08 (H) <0.031 ng/mL    Comment:        PERSISTENTLY INCREASED TROPONIN VALUES IN THE RANGE OF 0.04-0.49 ng/mL CAN BE SEEN IN:       -UNSTABLE ANGINA       -CONGESTIVE HEART FAILURE       -MYOCARDITIS       -CHEST TRAUMA       -ARRYHTHMIAS       -LATE PRESENTING MYOCARDIAL INFARCTION       -COPD   CLINICAL FOLLOW-UP RECOMMENDED.   Magnesium     Status: None   Collection Time: 09/23/15 12:36 PM  Result Value Ref Range   Magnesium 2.2 1.7 - 2.4 mg/dL  Procalcitonin - Baseline     Status: None   Collection Time: 09/23/15 12:36 PM  Result Value Ref Range   Procalcitonin 0.12 ng/mL    Comment:        Interpretation: PCT (Procalcitonin) <= 0.5 ng/mL: Systemic infection (sepsis) is not likely. Local bacterial infection is possible. (NOTE)         ICU PCT Algorithm               Non ICU PCT Algorithm    ----------------------------     ------------------------------         PCT < 0.25 ng/mL                 PCT < 0.1 ng/mL     Stopping of antibiotics            Stopping of antibiotics       strongly encouraged.               strongly encouraged.    ----------------------------     ------------------------------       PCT level decrease by               PCT < 0.25 ng/mL       >= 80% from peak PCT       OR PCT 0.25 - 0.5 ng/mL          Stopping of antibiotics                                             encouraged.  Stopping of antibiotics           encouraged.    ----------------------------     ------------------------------       PCT level decrease by              PCT >= 0.25 ng/mL       < 80% from peak PCT        AND PCT >= 0.5 ng/mL            Continuin g antibiotics                                              encouraged.       Continuing antibiotics            encouraged.    ----------------------------     ------------------------------     PCT level increase compared          PCT > 0.5 ng/mL         with peak PCT AND          PCT >= 0.5 ng/mL              Escalation of antibiotics                                          strongly encouraged.      Escalation of antibiotics        strongly encouraged.   Hepatic function panel     Status: Abnormal   Collection Time: 09/23/15 12:42 PM  Result Value Ref Range   Total Protein 8.7 (H) 6.5 - 8.1 g/dL   Albumin 4.7 3.5 - 5.0 g/dL   AST 22 15 - 41 U/L   ALT 13 (L) 14 - 54 U/L   Alkaline Phosphatase 84 38 - 126 U/L   Total Bilirubin 0.9 0.3 - 1.2 mg/dL   Bilirubin, Direct 0.1 0.1 - 0.5 mg/dL   Indirect Bilirubin 0.8 0.3 - 0.9 mg/dL  T4, free     Status: Abnormal   Collection Time: 09/23/15 12:45 PM  Result Value Ref Range   Free T4 1.62 (H) 0.61 - 1.12 ng/dL    Comment: (NOTE) Biotin ingestion may interfere with free T4 tests. If the results are inconsistent with the TSH level, previous test results, or the clinical presentation, then consider biotin interference. If needed, order repeat testing after stopping biotin. Performed at [facility]   I-Stat CG4 Lactic Acid, ED     Status: None   Collection Time: 09/23/15 12:48 PM  Result Value Ref Range   Lactic Acid, Venous 1.87 0.5 - 2.0 mmol/L  I-Stat beta hCG blood, ED     Status: None   Collection Time: 09/23/15 12:58 PM  Result Value Ref Range   I-stat hCG, quantitative <5.0 <5 mIU/mL   Comment 3            Comment:   GEST. AGE      CONC.  (mIU/mL)   <=1 WEEK        5 - 50     2 WEEKS       50 - 500     3 WEEKS       100 - 10,000     4 WEEKS  1,000 - 30,000        FEMALE AND NON-PREGNANT FEMALE:     LESS THAN 5 mIU/mL   Urinalysis, Routine w reflex microscopic (not at [facility])     Status: Abnormal   Collection Time: 09/23/15  2:11 PM  Result Value Ref Range   Color, Urine YELLOW YELLOW   APPearance CLEAR CLEAR   Specific Gravity, Urine <1.005 (L) 1.005 - 1.030   pH 6.5 5.0 - 8.0   Glucose, UA NEGATIVE NEGATIVE mg/dL   Hgb urine dipstick SMALL (A) NEGATIVE   Bilirubin Urine NEGATIVE NEGATIVE   Ketones, ur NEGATIVE  NEGATIVE mg/dL   Protein, ur TRACE (A) NEGATIVE mg/dL   Nitrite NEGATIVE NEGATIVE   Leukocytes, UA TRACE (A) NEGATIVE  Urine rapid drug screen (hosp performed)     Status: Abnormal   Collection Time: 09/23/15  2:11 PM  Result Value Ref Range   Opiates NONE DETECTED NONE DETECTED   Cocaine NONE DETECTED NONE DETECTED   Benzodiazepines POSITIVE (A) NONE DETECTED   Amphetamines NONE DETECTED NONE DETECTED   Tetrahydrocannabinol NONE DETECTED NONE DETECTED   Barbiturates NONE DETECTED NONE DETECTED    Comment:        DRUG SCREEN FOR MEDICAL PURPOSES ONLY.  IF CONFIRMATION IS NEEDED FOR ANY PURPOSE, NOTIFY LAB WITHIN 5 DAYS.        LOWEST DETECTABLE LIMITS FOR URINE DRUG SCREEN Drug Class       Cutoff (ng/mL) Amphetamine      1000 Barbiturate      200 Benzodiazepine   211 Tricyclics       941 Opiates          300 Cocaine          300 THC              50   Urine microscopic-add on     Status: Abnormal   Collection Time: 09/23/15  2:11 PM  Result Value Ref Range   Squamous Epithelial / LPF 0-5 (A) NONE SEEN   WBC, UA 0-5 0 - 5 WBC/hpf   RBC / HPF 0-5 0 - 5 RBC/hpf   Bacteria, UA FEW (A) NONE SEEN  Blood gas, arterial ([facility] & [facility] ONLY)     Status: Abnormal   Collection Time: 09/23/15  2:25 PM  Result Value Ref Range   FIO2 0.21    O2 Content 21.0 L/min   Delivery systems ROOM AIR    pH, Arterial 7.342 (L) 7.350 - 7.450   pCO2 arterial 26.8 (L) 35.0 - 45.0 mmHg   pO2, Arterial 100 80.0 - 100.0 mmHg   Bicarbonate 16.8 (L) 20.0 - 24.0 mEq/L   Acid-base deficit 10.5 (H) 0.0 - 2.0 mmol/L   O2 Saturation 97.6 %   Collection site RIGHT RADIAL    Drawn by [phone]    Sample type ARTERIAL    Allens test (pass/fail) PASS PASS  TSH     Status: Abnormal   Collection Time: 09/23/15  3:20 PM  Result Value Ref Range   TSH 0.232 (L) 0.350 - 4.500 uIU/mL  Lactic acid, plasma     Status: None   Collection Time: 09/23/15  6:31 PM  Result Value Ref Range   Lactic Acid, Venous 1.7 0.5 - 2.0  mmol/L  MRSA PCR Screening     Status: None   Collection Time: 09/23/15  7:00 PM  Result Value Ref Range   MRSA by PCR NEGATIVE NEGATIVE    Comment:  The GeneXpert MRSA Assay (FDA approved for NASAL specimens only), is one component of a comprehensive MRSA colonization surveillance program. It is not intended to diagnose MRSA infection nor to guide or monitor treatment for MRSA infections.   Na and K (sodium & potassium), rand urine     Status: None   Collection Time: 09/23/15  7:20 PM  Result Value Ref Range   Sodium, Ur 32 mmol/L   Potassium Urine 9 mmol/L  Basic metabolic panel     Status: Abnormal   Collection Time: 09/23/15 10:42 PM  Result Value Ref Range   Sodium 144 135 - 145 mmol/L   Potassium 1.6 (LL) 3.5 - 5.1 mmol/L    Comment: CRITICAL RESULT CALLED TO, READ BACK BY AND VERIFIED WITH: Inocencio Homes AT 2335 ON [id number] BY FORSYTH K    Chloride 124 (H) 101 - 111 mmol/L   CO2 16 (L) 22 - 32 mmol/L   Glucose, Bld 125 (H) 65 - 99 mg/dL   BUN 10 6 - 20 mg/dL   Creatinine, Ser 0.95 0.44 - 1.00 mg/dL   Calcium 7.6 (L) 8.9 - 10.3 mg/dL    Comment: CRITICAL RESULT CALLED TO, READ BACK BY AND VERIFIED WITH: Inocencio Homes AT 2335 ON [id number] BY FORSYTH K    GFR calc non Af Amer >60 >60 mL/min   GFR calc Af Amer >60 >60 mL/min    Comment: (NOTE) The eGFR has been calculated using the CKD EPI equation. This calculation has not been validated in all clinical situations. eGFR's persistently <60 mL/min signify possible Chronic Kidney Disease.    Anion gap 4 (L) 5 - 15  Lactic acid, plasma     Status: None   Collection Time: 09/23/15 10:42 PM  Result Value Ref Range   Lactic Acid, Venous 1.7 0.5 - 2.0 mmol/L  CK     Status: Abnormal   Collection Time: 09/23/15 10:42 PM  Result Value Ref Range   Total CK 331 (H) 38 - 234 U/L  Troponin I     Status: Abnormal   Collection Time: 09/23/15 10:42 PM  Result Value Ref Range   Troponin I 0.13 (H) <0.031 ng/mL    Comment:         PERSISTENTLY INCREASED TROPONIN VALUES IN THE RANGE OF 0.04-0.49 ng/mL CAN BE SEEN IN:       -UNSTABLE ANGINA       -CONGESTIVE HEART FAILURE       -MYOCARDITIS       -CHEST TRAUMA       -ARRYHTHMIAS       -LATE PRESENTING MYOCARDIAL INFARCTION       -COPD   CLINICAL FOLLOW-UP RECOMMENDED.   Sedimentation rate     Status: None   Collection Time: 09/24/15 12:41 AM  Result Value Ref Range   Sed Rate 10 0 - 22 mm/hr  Basic metabolic panel     Status: Abnormal   Collection Time: 09/24/15 12:41 AM  Result Value Ref Range   Sodium 144 135 - 145 mmol/L   Potassium 1.4 (LL) 3.5 - 5.1 mmol/L    Comment: DELTA CHECK NOTED CRITICAL RESULT CALLED TO, READ BACK BY AND VERIFIED WITH: WAGONER R AT 0212 ON [id number] BY FORSYTH K    Chloride 123 (H) 101 - 111 mmol/L   CO2 17 (L) 22 - 32 mmol/L   Glucose, Bld 146 (H) 65 - 99 mg/dL   BUN 9 6 - 20 mg/dL   Creatinine, Ser 0.94 0.44 -  1.00 mg/dL   Calcium 7.7 (L) 8.9 - 10.3 mg/dL   GFR calc non Af Amer >60 >60 mL/min   GFR calc Af Amer >60 >60 mL/min    Comment: (NOTE) The eGFR has been calculated using the CKD EPI equation. This calculation has not been validated in all clinical situations. eGFR's persistently <60 mL/min signify possible Chronic Kidney Disease.    Anion gap 4 (L) 5 - 15  Magnesium     Status: None   Collection Time: 09/24/15 12:41 AM  Result Value Ref Range   Magnesium 1.8 1.7 - 2.4 mg/dL  Magnesium     Status: None   Collection Time: 09/24/15  7:00 AM  Result Value Ref Range   Magnesium 1.8 1.7 - 2.4 mg/dL  Basic metabolic panel     Status: Abnormal   Collection Time: 09/24/15  7:00 AM  Result Value Ref Range   Sodium 143 135 - 145 mmol/L   Potassium 1.5 (LL) 3.5 - 5.1 mmol/L    Comment: CRITICAL RESULT CALLED TO, READ BACK BY AND VERIFIED WITH: GRAY,M AT 7:30AM ON 09/24/15 BY FESTERMAN,C    Chloride 122 (H) 101 - 111 mmol/L   CO2 17 (L) 22 - 32 mmol/L   Glucose, Bld 153 (H) 65 - 99 mg/dL   BUN 7 6 - 20 mg/dL    Creatinine, Ser 1.01 (H) 0.44 - 1.00 mg/dL   Calcium 8.5 (L) 8.9 - 10.3 mg/dL   GFR calc non Af Amer >60 >60 mL/min   GFR calc Af Amer >60 >60 mL/min    Comment: (NOTE) The eGFR has been calculated using the CKD EPI equation. This calculation has not been validated in all clinical situations. eGFR's persistently <60 mL/min signify possible Chronic Kidney Disease.    Anion gap 4 (L) 5 - 15  Troponin I     Status: Abnormal   Collection Time: 09/24/15  7:00 AM  Result Value Ref Range   Troponin I 0.08 (H) <0.031 ng/mL    Comment:        PERSISTENTLY INCREASED TROPONIN VALUES IN THE RANGE OF 0.04-0.49 ng/mL CAN BE SEEN IN:       -UNSTABLE ANGINA       -CONGESTIVE HEART FAILURE       -MYOCARDITIS       -CHEST TRAUMA       -ARRYHTHMIAS       -LATE PRESENTING MYOCARDIAL INFARCTION       -COPD   CLINICAL FOLLOW-UP RECOMMENDED.   CK     Status: Abnormal   Collection Time: 09/24/15  7:00 AM  Result Value Ref Range   Total CK 263 (H) 38 - 234 U/L    Dg Chest 1 View  09/23/2015  CLINICAL DATA:  Weakness since yesterday.  Unable to move. EXAM: CHEST 1 VIEW COMPARISON:  Chest x-rays dated 06/17/2015 and 05/17/2013. FINDINGS: Study is hypoinspiratory with crowding of the perihilar and bibasilar bronchovascular markings. Given the low lung volumes, lungs are clear. No evidence of pneumonia. No pleural effusion or pneumothorax. Osseous and soft tissue structures about the chest are unremarkable. IMPRESSION: Low lung volumes.  No active disease. Electronically Signed   By: Franki Cabot M.D.   On: 09/23/2015 13:44   Ct Head Wo Contrast  09/23/2015  CLINICAL DATA:  Diffuse upper and lower extremity weakness. EXAM: CT HEAD WITHOUT CONTRAST CT CERVICAL SPINE WITHOUT CONTRAST TECHNIQUE: Multidetector CT imaging of the head and cervical spine was performed following the standard protocol without intravenous  contrast. Multiplanar CT image reconstructions of the cervical spine were also generated.  COMPARISON:  Head CT May 17, 2013; cervical MRI September 17, 2012; CT cervical spine September 16, 2012 FINDINGS: CT HEAD FINDINGS The ventricles are normal in size and configuration. There is no intracranial mass, hemorrhage, extra-axial fluid collection, or midline shift. Gray-white compartments appear normal. No acute infarct evident. Bony calvarium appears intact. The mastoid air cells are clear. No intraorbital lesions are evident. CT CERVICAL SPINE FINDINGS There is no fracture or spondylolisthesis. Prevertebral soft tissues and predental space regions are normal. The disc spaces appear unremarkable. There is facet hypertrophy at several levels bilaterally. No nerve root edema or effacement is evident on this study. No disc extrusion or stenosis. IMPRESSION: CT head:  Study within normal limits. CT cervical spine: No fracture or spondylolisthesis. Relatively mild osteoarthritic changes noted at several levels. No nerve root edema or effacement is apparent. No disc extrusion or stenosis. Should be noted that assessment of the cervical cord is quite limited on noncontrast enhanced cervical spine CT. On most recent thoracolumbar from 2014, the patient did have paraspinal edema, not evident on current CT. It may be reasonable to consider MR correlation at this time, ideally pre and postcontrast, given the patient's symptoms and findings on prior MR examination from 2014. Electronically Signed   By: Lowella Grip III M.D.   On: 09/23/2015 13:54   Ct Cervical Spine Wo Contrast  09/23/2015  CLINICAL DATA:  Diffuse upper and lower extremity weakness. EXAM: CT HEAD WITHOUT CONTRAST CT CERVICAL SPINE WITHOUT CONTRAST TECHNIQUE: Multidetector CT imaging of the head and cervical spine was performed following the standard protocol without intravenous contrast. Multiplanar CT image reconstructions of the cervical spine were also generated. COMPARISON:  Head CT May 17, 2013; cervical MRI September 17, 2012; CT cervical spine  September 16, 2012 FINDINGS: CT HEAD FINDINGS The ventricles are normal in size and configuration. There is no intracranial mass, hemorrhage, extra-axial fluid collection, or midline shift. Gray-white compartments appear normal. No acute infarct evident. Bony calvarium appears intact. The mastoid air cells are clear. No intraorbital lesions are evident. CT CERVICAL SPINE FINDINGS There is no fracture or spondylolisthesis. Prevertebral soft tissues and predental space regions are normal. The disc spaces appear unremarkable. There is facet hypertrophy at several levels bilaterally. No nerve root edema or effacement is evident on this study. No disc extrusion or stenosis. IMPRESSION: CT head:  Study within normal limits. CT cervical spine: No fracture or spondylolisthesis. Relatively mild osteoarthritic changes noted at several levels. No nerve root edema or effacement is apparent. No disc extrusion or stenosis. Should be noted that assessment of the cervical cord is quite limited on noncontrast enhanced cervical spine CT. On most recent thoracolumbar from 2014, the patient did have paraspinal edema, not evident on current CT. It may be reasonable to consider MR correlation at this time, ideally pre and postcontrast, given the patient's symptoms and findings on prior MR examination from 2014. Electronically Signed   By: Lowella Grip III M.D.   On: 09/23/2015 13:54   Dg Chest Port 1 View  09/24/2015  CLINICAL DATA:  PICC line placement EXAM: PORTABLE CHEST 1 VIEW COMPARISON:  Portable exam 0935 hours compared to 09/23/2015 FINDINGS: RIGHT arm PICC line with tip projecting at confluence of the SVC. Normal heart size, mediastinal contours, and pulmonary vascularity. Mild RIGHT basilar atelectasis. Lungs otherwise clear. No pleural effusion or pneumothorax. IMPRESSION: Tip of RIGHT arm PICC line projects over the confluence of  the SVC. Subsegmental atelectasis RIGHT base. Electronically Signed   By: Lavonia Dana M.D.    On: 09/24/2015 09:56    Review of Systems  Constitutional: Positive for malaise/fatigue.  Respiratory: Negative for shortness of breath.   Cardiovascular: Negative for palpitations and leg swelling.  Gastrointestinal: Positive for nausea. Negative for vomiting, abdominal pain and diarrhea.  Neurological: Positive for weakness.   Blood pressure 104/70, pulse 83, temperature 97.7 F (36.5 C), temperature source Oral, resp. rate 28, height 5' (1.524 m), weight 65.4 kg (144 lb 2.9 oz), SpO2 98 %. Physical Exam  Constitutional: She is oriented to person, place, and time. No distress.  Neck: No JVD present.  Cardiovascular: Normal rate and regular rhythm.   No murmur heard. Respiratory: No respiratory distress. She has no wheezes.  GI: She exhibits no distension. There is no tenderness.  Musculoskeletal: She exhibits no edema.  Neurological: She is alert and oriented to person, place, and time.    Assessment/Plan: Problem #1 hypokalemia: Seems to be recurrent problem. Presently her potassium is low. Patient is not on diuretics. She has also episode of hypotension. Urine sodium is 72 and potassium is 9. Patient seemed to have more than 3 L of urine output hence hypokalemia seems to be secondary to renal loss. Problem #2 low CO2: Her pH is 7.34, PCO2 of 26.8. Her CO2 in her blood was 16. Hence patient seems to have anion gap metabolic acidosis. Her urine pH 6.5 hence patient is losing her bicarbonate was a urine. At this moment with hypokalemia/anion gap metabolic acidosis/high urine pH for her very low CO2/normal magnesium the etiology seems to be possibly type I or type II RTA. Since her magnesium is normal barter syndrome seems to be less likely Problem #3 history of hypothyroidism Problem #4 fibromyalgia Problem #5 mildly elevated troponin. Problem #6 possible obstructive uropathy: Patient had 700 mL of urine output when catheter was inserted. Patient with recurrent history of urine tract  infection. Problem #7 history of recent UTI: Presently she is a febrile but her white blood cell count is high. Plan: We'll do 24-hour urine for calcium, renal panel in the morning. 2] we'll change her IV fluid to half-normal saline with 40 mEq of KCl at 1 35 mL per hour 3] agree with by mouth potassium 4] we'll start her on sodium bicarbonate 650 mg 3 tablets by mouth 3 times a day.   Estefan Pattison S 09/24/2015, 11:31 AM

## 2015-09-24 NOTE — Progress Notes (Signed)
*  PRELIMINARY RESULTS* Echocardiogram 2D Echocardiogram has been performed.  Leavy Cella 09/24/2015, 4:43 PM

## 2015-09-24 NOTE — Progress Notes (Signed)
PT EXPERIENCING SEIZURE LIKE ACTIVITY, BUT REMAINS ALERT AND ABLE TO SPEAK . THI9S LASTED 10 MIN. DR OPYD NOTIFIED AND CAME TO EXAMINE PT.

## 2015-09-24 NOTE — Progress Notes (Signed)
MD informed of critical potassium level and will provide orders if necessary.

## 2015-09-24 NOTE — Progress Notes (Signed)
Responded to page regarding seizure-like activity. Pt found to be alert and fully oriented with muscle spasms involving the LUE and cramping of b/l calves with involuntary plantarflexion. Pt reports crampy muscle pain associated with this. Potassium is being replaced and most recently 2.3 after reaching nadir of 1.4 early this morning. CK had been mildly elevated and trending down. Etiology for her hypokalemia remains uncertain and endocrinology consultation has already been requested. Suspect the cramps are secondary to hypokalemia, though interesting that these symptoms were not evident when potassium was lower. She is mildly hypocalcemic and it's possible this may be contributing. Plan to give 1 g calcium gluconate and Ativan, repeat CK in am. Will continue to monitor.

## 2015-09-24 NOTE — Progress Notes (Signed)
Notified md of Bladder scan volume of 745cc.   New order for foley.

## 2015-09-25 ENCOUNTER — Inpatient Hospital Stay (HOSPITAL_COMMUNITY): Payer: Medicaid Other

## 2015-09-25 DIAGNOSIS — A419 Sepsis, unspecified organism: Secondary | ICD-10-CM

## 2015-09-25 DIAGNOSIS — R339 Retention of urine, unspecified: Secondary | ICD-10-CM | POA: Diagnosis present

## 2015-09-25 LAB — RPR: RPR: NONREACTIVE

## 2015-09-25 LAB — URINE CULTURE: CULTURE: NO GROWTH

## 2015-09-25 LAB — POTASSIUM: Potassium: 3.5 mmol/L (ref 3.5–5.1)

## 2015-09-25 LAB — BASIC METABOLIC PANEL
ANION GAP: 2 — AB (ref 5–15)
CHLORIDE: 119 mmol/L — AB (ref 101–111)
CO2: 21 mmol/L — ABNORMAL LOW (ref 22–32)
Calcium: 7.6 mg/dL — ABNORMAL LOW (ref 8.9–10.3)
Creatinine, Ser: 0.73 mg/dL (ref 0.44–1.00)
Glucose, Bld: 167 mg/dL — ABNORMAL HIGH (ref 65–99)
POTASSIUM: 4 mmol/L (ref 3.5–5.1)
SODIUM: 142 mmol/L (ref 135–145)

## 2015-09-25 LAB — CK: Total CK: 1876 U/L — ABNORMAL HIGH (ref 38–234)

## 2015-09-25 LAB — CBC
HEMATOCRIT: 31.9 % — AB (ref 36.0–46.0)
HEMOGLOBIN: 11.4 g/dL — AB (ref 12.0–15.0)
MCH: 32 pg (ref 26.0–34.0)
MCHC: 35.7 g/dL (ref 30.0–36.0)
MCV: 89.6 fL (ref 78.0–100.0)
Platelets: 274 10*3/uL (ref 150–400)
RBC: 3.56 MIL/uL — AB (ref 3.87–5.11)
RDW: 15.1 % (ref 11.5–15.5)
WBC: 14 10*3/uL — AB (ref 4.0–10.5)

## 2015-09-25 LAB — FOLATE: FOLATE: 4.3 ng/mL — AB (ref >5.9)

## 2015-09-25 LAB — HIV ANTIBODY (ROUTINE TESTING W REFLEX): HIV SCREEN 4TH GENERATION: NONREACTIVE

## 2015-09-25 LAB — PREALBUMIN: Prealbumin: 11.7 mg/dL — ABNORMAL LOW (ref 18–38)

## 2015-09-25 MED ORDER — AMILORIDE HCL 5 MG PO TABS
5.0000 mg | ORAL_TABLET | Freq: Every day | ORAL | Status: DC
  Administered 2015-09-25 – 2015-09-26 (×2): 5 mg via ORAL
  Filled 2015-09-25 (×3): qty 1

## 2015-09-25 MED ORDER — POTASSIUM CHLORIDE CRYS ER 20 MEQ PO TBCR
40.0000 meq | EXTENDED_RELEASE_TABLET | Freq: Once | ORAL | Status: AC
  Administered 2015-09-25: 40 meq via ORAL
  Filled 2015-09-25: qty 2

## 2015-09-25 MED ORDER — CYANOCOBALAMIN 1000 MCG/ML IJ SOLN
1000.0000 ug | Freq: Every day | INTRAMUSCULAR | Status: DC
Start: 2015-09-25 — End: 2015-09-28
  Administered 2015-09-25 – 2015-09-28 (×4): 1000 ug via INTRAMUSCULAR
  Filled 2015-09-25 (×4): qty 1

## 2015-09-25 MED ORDER — POTASSIUM CHLORIDE IN NACL 20-0.45 MEQ/L-% IV SOLN
INTRAVENOUS | Status: DC
  Administered 2015-09-25 – 2015-09-26 (×3): via INTRAVENOUS
  Filled 2015-09-25 (×7): qty 1000

## 2015-09-25 NOTE — Progress Notes (Addendum)
PROGRESS NOTE    Sharon Atkins  [id number] DOB: [birth year] DOA: 09/23/2015 PCP: Mickie Hillier, MD Outpatient Specialists:    Brief Narrative: 56 yof presented to the ED for generalized weakness. She was found to have severe hypokalemia at 1.5. She is found to be hypotensive and was admitted to stepdown. Initial concern for sepsis, however, patient has been afebrile and no obvious source of infection has been noted, so antibiotics have been discontinued for now. Neurology evaluated the patient felt a weakness was related to hypokalemia. She had a mild elevation of troponin. Cardiology saw the patient felt it was demand ischemia. Nephrology following and assisting with further work up of hypokalemia. Will continue to monitor.   Assessment & Plan:   Principal Problem:   Acute hypokalemia Active Problems:   Hypertension   Hypothyroidism   Weakness generalized   Sepsis (HCC)   Elevated troponin I level   Elevated CK  1. Acute hypokalemia. Etiology unclear as she has not had any significant GI losses and she is not on any diuretics. Urine sodium 32, urine potassium 9. Discussed with Nephrology and with hypokalemia and concurrent metabolic acidosis, there are concerns are for underlying RTA type 1 as etiology. Potassium has improved with supplementation. Continue to replete and monitor.  2. Generalized weakness. Likely secondary to acute hypokalemia. Improving. 3. Hypotension. Likely related to volume depletion, improved with hydration. Initially there were concerns for sepsis and she was started on vancomycin and Zosyn. She's been afebrile, normal lactic acid. No obvious focus of infection. Leukocytosis is likely reactive and has trended down with IVF. Cultures thus far has been unremarkable. Antibiotics have been discontinued. Will observe for now. 4. History of hypothyroidism. T4 elevated at 1.62. TSH found to be low at 0.232. Likely overcorrected with supplementation. Follow up with  endocrinology 5. Hypertension. Hold lisinporil currently on hold due to borderline blood pressures. 6. Elevated troponin. Cardiology input appreciated. Likely demand ischemia. Echocardiogram grossly normal as below. 7. Elevated CK. Possibly related to severe hypokalemia leading to rhabdomyolysis. Continue with IVF and monitor renal function 8. Urinary retention. Unclear etiology. Patient had post void residual greater than 700cc in bladder on admission. Foley catheter was placed. ? Related to electrolyte abnormalities. Will give voiding trial tomorrow.    DVT prophylaxis: Lovenox Code Status: Full Family Communication: discussed with patient at the bedside  Disposition Plan: Discharge home once improved.   Consultants:   Neurology  Cardiology  Nephrology  Procedures:   2D echo Study Conclusions  - Left ventricle: The cavity size was normal. Wall thickness was at  the upper limits of normal. Systolic function was vigorous. The  estimated ejection fraction was in the range of 65% to 70%. Wall  motion was normal; there were no regional wall motion  abnormalities. Left ventricular diastolic function parameters  were normal. - Aortic valve: There was trivial regurgitation. - Mitral valve: There was mild regurgitation. - Right atrium: Central venous pressure (est): 3 mm Hg. - Tricuspid valve: There was trivial regurgitation. - Pulmonary arteries: PA peak pressure: 19 mm Hg (S). - Pericardium, extracardiac: There was no pericardial effusion.  Antimicrobials:   Vancomycin 6/15 >>6/16  Zosyn 6/15 >> 6/16  Subjective: Patient had an episode last night where her body became stiff. Overall strength is improving.  Objective: Filed Vitals:   09/25/15 0100 09/25/15 0200 09/25/15 0300 09/25/15 0400  BP: 111/82 109/76 97/62 109/73  Pulse: 91 89 81 87  Temp:    98.4 F (36.9 C)  TempSrc:  Oral  Resp: 25 22 23 24   Height:      Weight:    69.4 kg (153 lb)  SpO2: 99%  98% 95% 96%    Intake/Output Summary (Last 24 hours) at 09/25/15 0600 Last data filed at 09/25/15 0401  Gross per 24 hour  Intake   3942 ml  Output  11125 ml  Net  -7183 ml   Filed Weights   09/23/15 1852 09/24/15 1900 09/25/15 0400  Weight: 65.4 kg (144 lb 2.9 oz) 65.4 kg (144 lb 2.9 oz) 69.4 kg (153 lb)    Examination:  General exam: Appears calm and comfortable  Respiratory system: Clear to auscultation. Respiratory effort normal. Cardiovascular system: S1 & S2 heard, RRR. No JVD, murmurs, rubs, gallops or clicks. No pedal edema. Gastrointestinal system: Abdomen is nondistended, soft and nontender. No organomegaly or masses felt. Normal bowel sounds heard. Central nervous system: Alert and oriented. No focal neurological deficits. Extremities: Symmetric 5 x 5 power. Skin: No rashes, lesions or ulcers Psychiatry: Judgement and insight appear normal. Mood & affect appropriate.   Data Reviewed: I have personally reviewed following labs and imaging studies  CBC:  Recent Labs Lab 09/23/15 1236  WBC 28.4*  NEUTROABS 23.6*  HGB 18.0*  HCT 49.3*  MCV 88.2  PLT 123456*   Basic Metabolic Panel:  Recent Labs Lab 09/23/15 1236 09/23/15 2242 09/24/15 0041 09/24/15 0700 09/24/15 1600  NA 140 144 144 143  --   K 1.4* 1.6* 1.4* 1.5* 2.3*  CL 113* 124* 123* 122*  --   CO2 13* 16* 17* 17*  --   GLUCOSE 160* 125* 146* 153*  --   BUN 10 10 9 7   --   CREATININE 0.99 0.95 0.94 1.01*  --   CALCIUM 10.4* 7.6* 7.7* 8.5*  --   MG 2.2  --  1.8 1.8  --    GFR: Estimated Creatinine Clearance: 60.5 mL/min (by C-G formula based on Cr of 1.01). Liver Function Tests:  Recent Labs Lab 09/23/15 1242  AST 22  ALT 13*  ALKPHOS 84  BILITOT 0.9  PROT 8.7*  ALBUMIN 4.7   No results for input(s): LIPASE, AMYLASE in the last 168 hours. No results for input(s): AMMONIA in the last 168 hours. Coagulation Profile: No results for input(s): INR, PROTIME in the last 168 hours. Cardiac  Enzymes:  Recent Labs Lab 09/23/15 1236 09/23/15 2242 09/24/15 0700  CKTOTAL 523* 331* 263*  TROPONINI 0.08* 0.13* 0.08*   BNP (last 3 results) No results for input(s): PROBNP in the last 8760 hours. HbA1C: No results for input(s): HGBA1C in the last 72 hours. CBG:  Recent Labs Lab 09/23/15 1222  GLUCAP 165*   Lipid Profile: No results for input(s): CHOL, HDL, LDLCALC, TRIG, CHOLHDL, LDLDIRECT in the last 72 hours. Thyroid Function Tests:  Recent Labs  09/23/15 1245 09/23/15 1520  TSH  --  0.232*  FREET4 1.62*  --    Anemia Panel:  Recent Labs  09/24/15 0041  VITAMINB12 147*   Urine analysis:    Component Value Date/Time   COLORURINE YELLOW 09/23/2015 [place] 09/23/2015 1411   LABSPEC <1.005* 09/23/2015 1411   PHURINE 6.5 09/23/2015 1411   GLUCOSEU NEGATIVE 09/23/2015 1411   HGBUR SMALL* 09/23/2015 1411   BILIRUBINUR NEGATIVE 09/23/2015 1411   KETONESUR NEGATIVE 09/23/2015 1411   PROTEINUR TRACE* 09/23/2015 1411   UROBILINOGEN 0.2 11/21/2013 0505   NITRITE NEGATIVE 09/23/2015 1411   LEUKOCYTESUR TRACE* 09/23/2015 1411  Sepsis Labs: @LABRCNTIP (procalcitonin:4,lacticidven:4)  ) Recent Results (from the past 240 hour(s))  MRSA PCR Screening     Status: None   Collection Time: 09/23/15  7:00 PM  Result Value Ref Range Status   MRSA by PCR NEGATIVE NEGATIVE Final    Comment:        The GeneXpert MRSA Assay (FDA approved for NASAL specimens only), is one component of a comprehensive MRSA colonization surveillance program. It is not intended to diagnose MRSA infection nor to guide or monitor treatment for MRSA infections.          Radiology Studies: Dg Chest 1 View  09/23/2015  CLINICAL DATA:  Weakness since yesterday.  Unable to move. EXAM: CHEST 1 VIEW COMPARISON:  Chest x-rays dated 06/17/2015 and 05/17/2013. FINDINGS: Study is hypoinspiratory with crowding of the perihilar and bibasilar bronchovascular markings. Given  the low lung volumes, lungs are clear. No evidence of pneumonia. No pleural effusion or pneumothorax. Osseous and soft tissue structures about the chest are unremarkable. IMPRESSION: Low lung volumes.  No active disease. Electronically Signed   By: Franki Cabot M.D.   On: 09/23/2015 13:44   Ct Head Wo Contrast  09/23/2015  CLINICAL DATA:  Diffuse upper and lower extremity weakness. EXAM: CT HEAD WITHOUT CONTRAST CT CERVICAL SPINE WITHOUT CONTRAST TECHNIQUE: Multidetector CT imaging of the head and cervical spine was performed following the standard protocol without intravenous contrast. Multiplanar CT image reconstructions of the cervical spine were also generated. COMPARISON:  Head CT May 17, 2013; cervical MRI September 17, 2012; CT cervical spine September 16, 2012 FINDINGS: CT HEAD FINDINGS The ventricles are normal in size and configuration. There is no intracranial mass, hemorrhage, extra-axial fluid collection, or midline shift. Gray-white compartments appear normal. No acute infarct evident. Bony calvarium appears intact. The mastoid air cells are clear. No intraorbital lesions are evident. CT CERVICAL SPINE FINDINGS There is no fracture or spondylolisthesis. Prevertebral soft tissues and predental space regions are normal. The disc spaces appear unremarkable. There is facet hypertrophy at several levels bilaterally. No nerve root edema or effacement is evident on this study. No disc extrusion or stenosis. IMPRESSION: CT head:  Study within normal limits. CT cervical spine: No fracture or spondylolisthesis. Relatively mild osteoarthritic changes noted at several levels. No nerve root edema or effacement is apparent. No disc extrusion or stenosis. Should be noted that assessment of the cervical cord is quite limited on noncontrast enhanced cervical spine CT. On most recent thoracolumbar from 2014, the patient did have paraspinal edema, not evident on current CT. It may be reasonable to consider MR correlation at  this time, ideally pre and postcontrast, given the patient's symptoms and findings on prior MR examination from 2014. Electronically Signed   By: Lowella Grip III M.D.   On: 09/23/2015 13:54   Ct Cervical Spine Wo Contrast  09/23/2015  CLINICAL DATA:  Diffuse upper and lower extremity weakness. EXAM: CT HEAD WITHOUT CONTRAST CT CERVICAL SPINE WITHOUT CONTRAST TECHNIQUE: Multidetector CT imaging of the head and cervical spine was performed following the standard protocol without intravenous contrast. Multiplanar CT image reconstructions of the cervical spine were also generated. COMPARISON:  Head CT May 17, 2013; cervical MRI September 17, 2012; CT cervical spine September 16, 2012 FINDINGS: CT HEAD FINDINGS The ventricles are normal in size and configuration. There is no intracranial mass, hemorrhage, extra-axial fluid collection, or midline shift. Gray-white compartments appear normal. No acute infarct evident. Bony calvarium appears intact. The mastoid air cells are clear. No  intraorbital lesions are evident. CT CERVICAL SPINE FINDINGS There is no fracture or spondylolisthesis. Prevertebral soft tissues and predental space regions are normal. The disc spaces appear unremarkable. There is facet hypertrophy at several levels bilaterally. No nerve root edema or effacement is evident on this study. No disc extrusion or stenosis. IMPRESSION: CT head:  Study within normal limits. CT cervical spine: No fracture or spondylolisthesis. Relatively mild osteoarthritic changes noted at several levels. No nerve root edema or effacement is apparent. No disc extrusion or stenosis. Should be noted that assessment of the cervical cord is quite limited on noncontrast enhanced cervical spine CT. On most recent thoracolumbar from 2014, the patient did have paraspinal edema, not evident on current CT. It may be reasonable to consider MR correlation at this time, ideally pre and postcontrast, given the patient's symptoms and findings  on prior MR examination from 2014. Electronically Signed   By: Lowella Grip III M.D.   On: 09/23/2015 13:54   Dg Chest Port 1 View  09/24/2015  CLINICAL DATA:  PICC line placement EXAM: PORTABLE CHEST 1 VIEW COMPARISON:  Portable exam 0935 hours compared to 09/23/2015 FINDINGS: RIGHT arm PICC line with tip projecting at confluence of the SVC. Normal heart size, mediastinal contours, and pulmonary vascularity. Mild RIGHT basilar atelectasis. Lungs otherwise clear. No pleural effusion or pneumothorax. IMPRESSION: Tip of RIGHT arm PICC line projects over the confluence of the SVC. Subsegmental atelectasis RIGHT base. Electronically Signed   By: Lavonia Dana M.D.   On: 09/24/2015 09:56        Scheduled Meds: . enoxaparin (LOVENOX) injection  40 mg Subcutaneous Q24H  . levothyroxine  112 mcg Oral QAC breakfast  . sodium bicarbonate  1,300 mg Oral QID   Continuous Infusions: . 0.9 % NaCl with KCl 40 mEq / L 135 mL/hr (09/25/15 0401)     LOS: 2 days    Time spent: 25 minutes   Kathie Dike, MD [facility] Pager [phone]  If 7PM-7AM, please contact night-coverage [email or web address] Password [email or web address] 09/25/2015, 6:00 AM

## 2015-09-25 NOTE — Progress Notes (Signed)
Subjective: Interval History: has no complaint of weakness. She didn't have any nausea or vomiting. Patient had episode of seizure-like activity last night according to her mother..  Objective: Vital signs in last 24 hours: Temp:  [97.9 F (36.6 C)-99.7 F (37.6 C)] 97.9 F (36.6 C) (06/17 0853) Pulse Rate:  [78-104] 94 (06/17 0900) Resp:  [14-35] 26 (06/17 0900) BP: (97-147)/(62-95) 124/95 mmHg (06/17 0900) SpO2:  [95 %-100 %] 99 % (06/17 0900) Weight:  [65.4 kg (144 lb 2.9 oz)-69.4 kg (153 lb)] 69.4 kg (153 lb) (06/17 0400) Weight change: -0.372 kg (-13.1 oz)  Intake/Output from previous day: 06/16 0701 - 06/17 0700 In: 4402 [P.O.:600; I.V.:3102; IV Piggyback:700] Out: 7525 [Urine:7525] Intake/Output this shift: Total I/O In: 535.5 [I.V.:535.5] Out: 925 [Urine:925]  General appearance: alert, cooperative and no distress Resp: clear to auscultation bilaterally Cardio: regular rate and rhythm, S1, S2 normal, no murmur, click, rub or gallop GI: soft, non-tender; bowel sounds normal; no masses,  no organomegaly Extremities: extremities normal, atraumatic, no cyanosis or edema  Lab Results:  Recent Labs  09/23/15 1236 09/25/15 0520  WBC 28.4* 14.0*  HGB 18.0* 11.4*  HCT 49.3* 31.9*  PLT 443* 274   BMET:  Recent Labs  09/24/15 0700 09/24/15 1600 09/25/15 0520  NA 143  --  142  K 1.5* 2.3* 4.0  CL 122*  --  119*  CO2 17*  --  21*  GLUCOSE 153*  --  167*  BUN 7  --  <5*  CREATININE 1.01*  --  0.73  CALCIUM 8.5*  --  7.6*   No results for input(s): PTH in the last 72 hours. Iron Studies: No results for input(s): IRON, TIBC, TRANSFERRIN, FERRITIN in the last 72 hours.  Studies/Results: Dg Chest 1 View  09/23/2015  CLINICAL DATA:  Weakness since yesterday.  Unable to move. EXAM: CHEST 1 VIEW COMPARISON:  Chest x-rays dated 06/17/2015 and 05/17/2013. FINDINGS: Study is hypoinspiratory with crowding of the perihilar and bibasilar bronchovascular markings. Given the  low lung volumes, lungs are clear. No evidence of pneumonia. No pleural effusion or pneumothorax. Osseous and soft tissue structures about the chest are unremarkable. IMPRESSION: Low lung volumes.  No active disease. Electronically Signed   By: Franki Cabot M.D.   On: 09/23/2015 13:44   Ct Head Wo Contrast  09/23/2015  CLINICAL DATA:  Diffuse upper and lower extremity weakness. EXAM: CT HEAD WITHOUT CONTRAST CT CERVICAL SPINE WITHOUT CONTRAST TECHNIQUE: Multidetector CT imaging of the head and cervical spine was performed following the standard protocol without intravenous contrast. Multiplanar CT image reconstructions of the cervical spine were also generated. COMPARISON:  Head CT May 17, 2013; cervical MRI September 17, 2012; CT cervical spine September 16, 2012 FINDINGS: CT HEAD FINDINGS The ventricles are normal in size and configuration. There is no intracranial mass, hemorrhage, extra-axial fluid collection, or midline shift. Gray-white compartments appear normal. No acute infarct evident. Bony calvarium appears intact. The mastoid air cells are clear. No intraorbital lesions are evident. CT CERVICAL SPINE FINDINGS There is no fracture or spondylolisthesis. Prevertebral soft tissues and predental space regions are normal. The disc spaces appear unremarkable. There is facet hypertrophy at several levels bilaterally. No nerve root edema or effacement is evident on this study. No disc extrusion or stenosis. IMPRESSION: CT head:  Study within normal limits. CT cervical spine: No fracture or spondylolisthesis. Relatively mild osteoarthritic changes noted at several levels. No nerve root edema or effacement is apparent. No disc extrusion or stenosis. Should  be noted that assessment of the cervical cord is quite limited on noncontrast enhanced cervical spine CT. On most recent thoracolumbar from 2014, the patient did have paraspinal edema, not evident on current CT. It may be reasonable to consider MR correlation at  this time, ideally pre and postcontrast, given the patient's symptoms and findings on prior MR examination from 2014. Electronically Signed   By: Lowella Grip III M.D.   On: 09/23/2015 13:54   Ct Cervical Spine Wo Contrast  09/23/2015  CLINICAL DATA:  Diffuse upper and lower extremity weakness. EXAM: CT HEAD WITHOUT CONTRAST CT CERVICAL SPINE WITHOUT CONTRAST TECHNIQUE: Multidetector CT imaging of the head and cervical spine was performed following the standard protocol without intravenous contrast. Multiplanar CT image reconstructions of the cervical spine were also generated. COMPARISON:  Head CT May 17, 2013; cervical MRI September 17, 2012; CT cervical spine September 16, 2012 FINDINGS: CT HEAD FINDINGS The ventricles are normal in size and configuration. There is no intracranial mass, hemorrhage, extra-axial fluid collection, or midline shift. Gray-white compartments appear normal. No acute infarct evident. Bony calvarium appears intact. The mastoid air cells are clear. No intraorbital lesions are evident. CT CERVICAL SPINE FINDINGS There is no fracture or spondylolisthesis. Prevertebral soft tissues and predental space regions are normal. The disc spaces appear unremarkable. There is facet hypertrophy at several levels bilaterally. No nerve root edema or effacement is evident on this study. No disc extrusion or stenosis. IMPRESSION: CT head:  Study within normal limits. CT cervical spine: No fracture or spondylolisthesis. Relatively mild osteoarthritic changes noted at several levels. No nerve root edema or effacement is apparent. No disc extrusion or stenosis. Should be noted that assessment of the cervical cord is quite limited on noncontrast enhanced cervical spine CT. On most recent thoracolumbar from 2014, the patient did have paraspinal edema, not evident on current CT. It may be reasonable to consider MR correlation at this time, ideally pre and postcontrast, given the patient's symptoms and findings  on prior MR examination from 2014. Electronically Signed   By: Lowella Grip III M.D.   On: 09/23/2015 13:54   Dg Chest Port 1 View  09/24/2015  CLINICAL DATA:  PICC line placement EXAM: PORTABLE CHEST 1 VIEW COMPARISON:  Portable exam 0935 hours compared to 09/23/2015 FINDINGS: RIGHT arm PICC line with tip projecting at confluence of the SVC. Normal heart size, mediastinal contours, and pulmonary vascularity. Mild RIGHT basilar atelectasis. Lungs otherwise clear. No pleural effusion or pneumothorax. IMPRESSION: Tip of RIGHT arm PICC line projects over the confluence of the SVC. Subsegmental atelectasis RIGHT base. Electronically Signed   By: Lavonia Dana M.D.   On: 09/24/2015 09:56    I have reviewed the patient's current medications.  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 Patient on potassium supplement and her potassium has normalized. Problem #2 metabolic acidosis: Non-anion gap. Most likely related to the above. Patient is on sodium bicarbonate orally and her CO2 has improved Problem #3 muscle weakness: Improving. Most likely associated to her low potassium Problem #4 elevated CPK: Possibly secondary to muscle injury associated to her hypokalemia. Problem #5 polyuria: Most likely secondary to recovery phase of obstructive uropathy versus nephrogenic DI from her hypokalemia. Presently patient had about 7500 mL of urine output the last 24 hours. Problem #6 possible history of obstructive uropathy. Patient with 750 mL of urine when she was in emergency room when Foley catheter was inserted. Patient with previous history of urine tract infection  possibly she might have urethral stricture. Problem #7 leukocytosis: Etiology is not clear but her white blood cell count is improving Problem #8 anemia Problem #9 hypothyroidism Plan: DC by mouth potassium 2] we'll change her IV fluid to half normal saline with 20 mEq of potassium at 125 mL per hour 3] we'll  start on amiloride 5 mg by mouth daily 4] we will remove Foley catheter in the morning. If she has problem of micturition we may need to call urology consult. 5] we'll do ultrasound of the kidneys 6] we'll check her renal panel in the morning    LOS: 2 days   Moo Gravley S 09/25/2015,10:16 AM

## 2015-09-26 LAB — CBC
HEMATOCRIT: 32.3 % — AB (ref 36.0–46.0)
Hemoglobin: 11.3 g/dL — ABNORMAL LOW (ref 12.0–15.0)
MCH: 31.8 pg (ref 26.0–34.0)
MCHC: 35 g/dL (ref 30.0–36.0)
MCV: 91 fL (ref 78.0–100.0)
PLATELETS: 248 10*3/uL (ref 150–400)
RBC: 3.55 MIL/uL — ABNORMAL LOW (ref 3.87–5.11)
RDW: 14.5 % (ref 11.5–15.5)
WBC: 12.1 10*3/uL — AB (ref 4.0–10.5)

## 2015-09-26 LAB — RENAL FUNCTION PANEL
ALBUMIN: 2.7 g/dL — AB (ref 3.5–5.0)
Anion gap: 3 — ABNORMAL LOW (ref 5–15)
BUN: 5 mg/dL — AB (ref 6–20)
CALCIUM: 8 mg/dL — AB (ref 8.9–10.3)
CO2: 24 mmol/L (ref 22–32)
CREATININE: 0.59 mg/dL (ref 0.44–1.00)
Chloride: 110 mmol/L (ref 101–111)
GFR calc Af Amer: >60 mL/min (ref >60)
GLUCOSE: 123 mg/dL — AB (ref 65–99)
PHOSPHORUS: 2.3 mg/dL — AB (ref 2.5–4.6)
POTASSIUM: 4.2 mmol/L (ref 3.5–5.1)
SODIUM: 137 mmol/L (ref 135–145)

## 2015-09-26 LAB — URINALYSIS, ROUTINE W REFLEX MICROSCOPIC
BILIRUBIN URINE: NEGATIVE
Glucose, UA: NEGATIVE mg/dL
HGB URINE DIPSTICK: NEGATIVE
KETONES UR: NEGATIVE mg/dL
Leukocytes, UA: NEGATIVE
NITRITE: NEGATIVE
PROTEIN: NEGATIVE mg/dL
Specific Gravity, Urine: 1.01 (ref 1.005–1.030)
pH: 7.5 (ref 5.0–8.0)

## 2015-09-26 LAB — CK: CK TOTAL: 2601 U/L — AB (ref 38–234)

## 2015-09-26 MED ORDER — SODIUM BICARBONATE 650 MG PO TABS
650.0000 mg | ORAL_TABLET | Freq: Four times a day (QID) | ORAL | Status: DC
  Administered 2015-09-26 (×3): 650 mg via ORAL
  Filled 2015-09-26 (×3): qty 1

## 2015-09-26 MED ORDER — AMILORIDE HCL 5 MG PO TABS
10.0000 mg | ORAL_TABLET | Freq: Every day | ORAL | Status: DC
  Administered 2015-09-27: 10 mg via ORAL
  Filled 2015-09-26 (×3): qty 2

## 2015-09-26 MED ORDER — SODIUM CHLORIDE 0.9 % IV SOLN
INTRAVENOUS | Status: DC
  Administered 2015-09-26 – 2015-09-27 (×5): via INTRAVENOUS

## 2015-09-26 MED ORDER — POTASSIUM CHLORIDE CRYS ER 20 MEQ PO TBCR
40.0000 meq | EXTENDED_RELEASE_TABLET | Freq: Every day | ORAL | Status: DC
  Administered 2015-09-26: 40 meq via ORAL
  Filled 2015-09-26: qty 2

## 2015-09-26 NOTE — Progress Notes (Signed)
Pt left the floor with nursing staff.  Pt in no distress.  Belongings, paper chart, and medications sent with patient.  Report called to Dept. 300 RN.   Vitals signs as follows:  Temp: 97.9 F (36.6 C) (06/18 1202) Temp Source: Oral (06/18 1202) BP: 132/88 mmHg (06/18 1200) Pulse Rate: 82 (06/18 1200) Respirations: 23

## 2015-09-26 NOTE — Progress Notes (Signed)
PROGRESS NOTE    Sharon Atkins  [id number] DOB: [birth year] DOA: 09/23/2015 PCP: Mickie Hillier, MD Outpatient Specialists:    Brief Narrative: 64 yof presented to the ED for generalized weakness. She was found to have severe hypokalemia at 1.5. She is found to be hypotensive and was admitted to stepdown. Initial concern for sepsis, however, patient has been afebrile and no obvious source of infection has been noted, so antibiotics have been discontinued for now. Neurology evaluated the patient felt a weakness was related to hypokalemia. She had a mild elevation of troponin. Cardiology saw the patient felt it was demand ischemia. Nephrology following and assisting with further work up of hypokalemia. Will continue to monitor.   Assessment & Plan:   Principal Problem:   Acute hypokalemia Active Problems:   Hypertension   Hypothyroidism   Weakness generalized   Sepsis (HCC)   Elevated troponin I level   Elevated CK   Urinary retention  1. Acute hypokalemia. Resolved. Etiology unclear as she has not had any significant GI losses and she is not on any diuretics. Urine sodium 32, urine potassium 9. Discussed with Nephrology due to hypokalemia and concurrent metabolic acidosis, there are concerns are for underlying RTA type 1 as etiology. Potassium has improved with supplementation. She has been started on amiloride and will change potassium supplementation to po. 2. Generalized weakness. Likely secondary to acute hypokalemia. Will request PT eval 3. Hypotension. Likely related to volume depletion, resolved with hydration. Initially there were concerns for sepsis and she was started on vancomycin and Zosyn. She's been afebrile, normal lactic acid. No obvious focus of infection. Leukocytosis is likely reactive and has trended down with IVF. Cultures thus far has been unremarkable. Antibiotics have been discontinued. Will observe for now. 4. History of hypothyroidism. T4 elevated at 1.62.  TSH found to be low at 0.232. Likely overcorrected with supplementation. Follow up with endocrinology 5. Hypertension. Lisinporil currently on hold due to borderline blood pressures on admission. 6. Elevated troponin. Cardiology input appreciated. Likely demand ischemia. Echocardiogram grossly normal as below. 7. Elevated CK. Possibly related to severe hypokalemia leading to rhabdomyolysis. Continue with IVF and monitor renal function. CK currently trending up. 8. Urinary retention. Unclear etiology. Patient had post void residual greater than 700cc in bladder on admission. Foley catheter was placed. ? Related to electrolyte abnormalities. She will undergo voiding trial today 9. B12 deficiency. Possibly related to nutritional deficiency. Intrinsic factor abs and anti parietal abs sent. She has been started on supplementation.   DVT prophylaxis: Lovenox Code Status: Full Family Communication: discussed with patient at the bedside  Disposition Plan: discharge home once improved   Consultants:   Neurology  Cardiology  Nephrology  Procedures:   2D echo Study Conclusions  - Left ventricle: The cavity size was normal. Wall thickness was at  the upper limits of normal. Systolic function was vigorous. The  estimated ejection fraction was in the range of 65% to 70%. Wall  motion was normal; there were no regional wall motion  abnormalities. Left ventricular diastolic function parameters  were normal. - Aortic valve: There was trivial regurgitation. - Mitral valve: There was mild regurgitation. - Right atrium: Central venous pressure (est): 3 mm Hg. - Tricuspid valve: There was trivial regurgitation. - Pulmonary arteries: PA peak pressure: 19 mm Hg (S). - Pericardium, extracardiac: There was no pericardial effusion.  Antimicrobials:   Vancomycin 6/15 >>6/16  Zosyn 6/15 >> 6/16  Subjective: Still feels weak. Has diffuse myalgias. No nausea or vomiting.  Objective: Filed  Vitals:   09/26/15 0300 09/26/15 0400 09/26/15 0500 09/26/15 0600  BP: 121/89 122/85 117/80 137/79  Pulse: 79 57 66 71  Temp:  98.4 F (36.9 C)    TempSrc:  Oral    Resp: 28 19 21 17   Height:      Weight:   69.3 kg (152 lb 12.5 oz)   SpO2: 95% 98% 96% 98%    Intake/Output Summary (Last 24 hours) at 09/26/15 0646 Last data filed at 09/26/15 0600  Gross per 24 hour  Intake   4148 ml  Output   4875 ml  Net   -727 ml   Filed Weights   09/24/15 1900 09/25/15 0400 09/26/15 0500  Weight: 65.4 kg (144 lb 2.9 oz) 69.4 kg (153 lb) 69.3 kg (152 lb 12.5 oz)    Examination:  General exam: Appears calm and comfortable  Respiratory system: Clear to auscultation. Respiratory effort normal. Cardiovascular system: S1 & S2 heard, RRR. No JVD, murmurs, rubs, gallops or clicks. No pedal edema. Gastrointestinal system: Abdomen is nondistended, soft and nontender. No organomegaly or masses felt. Normal bowel sounds heard. Central nervous system: Alert and oriented. No focal neurological deficits. Extremities: Symmetric 5 x 5 power. Skin: No rashes, lesions or ulcers Psychiatry: Judgement and insight appear normal. Mood & affect appropriate.   Data Reviewed: I have personally reviewed following labs and imaging studies  CBC:  Recent Labs Lab 09/23/15 1236 09/25/15 0520 09/26/15 0515  WBC 28.4* 14.0* 12.1*  NEUTROABS 23.6*  --   --   HGB 18.0* 11.4* 11.3*  HCT 49.3* 31.9* 32.3*  MCV 88.2 89.6 91.0  PLT 443* 274 [id number]   Basic Metabolic Panel:  Recent Labs Lab 09/23/15 1236 09/23/15 2242 09/24/15 0041 09/24/15 0700 09/24/15 1600 09/25/15 0520 09/25/15 1525  NA 140 144 144 143  --  142  --   K 1.4* 1.6* 1.4* 1.5* 2.3* 4.0 3.5  CL 113* 124* 123* 122*  --  119*  --   CO2 13* 16* 17* 17*  --  21*  --   GLUCOSE 160* 125* 146* 153*  --  167*  --   BUN 10 10 9 7   --  <5*  --   CREATININE 0.99 0.95 0.94 1.01*  --  0.73  --   CALCIUM 10.4* 7.6* 7.7* 8.5*  --  7.6*  --   MG 2.2  --   1.8 1.8  --   --   --    GFR: Estimated Creatinine Clearance: 76.3 mL/min (by C-G formula based on Cr of 0.73). Liver Function Tests:  Recent Labs Lab 09/23/15 1242  AST 22  ALT 13*  ALKPHOS 84  BILITOT 0.9  PROT 8.7*  ALBUMIN 4.7   No results for input(s): LIPASE, AMYLASE in the last 168 hours. No results for input(s): AMMONIA in the last 168 hours. Coagulation Profile: No results for input(s): INR, PROTIME in the last 168 hours. Cardiac Enzymes:  Recent Labs Lab 09/23/15 1236 09/23/15 2242 09/24/15 0700 09/25/15 0520  CKTOTAL 523* 331* 263* 1876*  TROPONINI 0.08* 0.13* 0.08*  --    BNP (last 3 results) No results for input(s): PROBNP in the last 8760 hours. HbA1C: No results for input(s): HGBA1C in the last 72 hours. CBG:  Recent Labs Lab 09/23/15 1222  GLUCAP 165*   Lipid Profile: No results for input(s): CHOL, HDL, LDLCALC, TRIG, CHOLHDL, LDLDIRECT in the last 72 hours. Thyroid Function Tests:  Recent Labs  09/23/15 1245 09/23/15 1520  TSH  --  0.232*  FREET4 1.62*  --    Anemia Panel:  Recent Labs  09/24/15 0041 09/25/15 1131  VITAMINB12 147*  --   FOLATE  --  4.3*   Urine analysis:    Component Value Date/Time   COLORURINE YELLOW 09/26/2015 0500   APPEARANCEUR CLEAR 09/26/2015 0500   LABSPEC 1.010 09/26/2015 0500   PHURINE 7.5 09/26/2015 0500   GLUCOSEU NEGATIVE 09/26/2015 0500   HGBUR NEGATIVE 09/26/2015 0500   BILIRUBINUR NEGATIVE 09/26/2015 0500   KETONESUR NEGATIVE 09/26/2015 0500   PROTEINUR NEGATIVE 09/26/2015 0500   UROBILINOGEN 0.2 11/21/2013 0505   NITRITE NEGATIVE 09/26/2015 0500   LEUKOCYTESUR NEGATIVE 09/26/2015 0500   Sepsis Labs: @LABRCNTIP (procalcitonin:4,lacticidven:4)  ) Recent Results (from the past 240 hour(s))  Blood culture (routine x 2)     Status: None (Preliminary result)   Collection Time: 09/23/15 12:36 PM  Result Value Ref Range Status   Specimen Description BLOOD LEFT ANTECUBITAL  Final   Special  Requests   Final    BOTTLES DRAWN AEROBIC AND ANAEROBIC AEB=7CC ANA=5CC   Culture NO GROWTH 2 DAYS  Final   Report Status PENDING  Incomplete  Blood culture (routine x 2)     Status: None (Preliminary result)   Collection Time: 09/23/15 12:42 PM  Result Value Ref Range Status   Specimen Description BLOOD RIGHT FOREARM DRAWN BY RN  Final   Special Requests BOTTLES DRAWN AEROBIC AND ANAEROBIC 5CC EACH  Final   Culture NO GROWTH 2 DAYS  Final   Report Status PENDING  Incomplete  Urine culture     Status: None   Collection Time: 09/23/15  2:11 PM  Result Value Ref Range Status   Specimen Description URINE, CATHETERIZED  Final   Special Requests NONE  Final   Culture NO GROWTH Performed at [facility]   Final   Report Status 09/25/2015 FINAL  Final  MRSA PCR Screening     Status: None   Collection Time: 09/23/15  7:00 PM  Result Value Ref Range Status   MRSA by PCR NEGATIVE NEGATIVE Final    Comment:        The GeneXpert MRSA Assay (FDA approved for NASAL specimens only), is one component of a comprehensive MRSA colonization surveillance program. It is not intended to diagnose MRSA infection nor to guide or monitor treatment for MRSA infections.          Radiology Studies: US Renal  09/25/2015  CLINICAL DATA:  Urine retention for 2 days. EXAM: RENAL / URINARY TRACT ULTRASOUND COMPLETE COMPARISON:  None. FINDINGS: Right Kidney: Length: 10.2 cm. Echogenicity within normal limits. No mass or hydronephrosis visualized. Left Kidney: Length: 11.8 cm. Echogenicity within normal limits. No mass or hydronephrosis visualized. Bladder: Appears normal for degree of bladder distention. Urinary Foley is in place. IMPRESSION: Normal appearance of bilateral kidneys. Moderate distention of the urinary bladder with Foley catheter in place. If the catheter had not been intentionally occluded, this suggests in proper draining of the catheter. Electronically Signed   By: Fidela Salisbury  M.D.   On: 09/25/2015 13:47   Dg Chest Port 1 View  09/24/2015  CLINICAL DATA:  PICC line placement EXAM: PORTABLE CHEST 1 VIEW COMPARISON:  Portable exam 0935 hours compared to 09/23/2015 FINDINGS: RIGHT arm PICC line with tip projecting at confluence of the SVC. Normal heart size, mediastinal contours, and pulmonary vascularity. Mild RIGHT basilar atelectasis. Lungs otherwise clear. No pleural effusion or pneumothorax. IMPRESSION: Tip of RIGHT arm  PICC line projects over the confluence of the SVC. Subsegmental atelectasis RIGHT base. Electronically Signed   By: Lavonia Dana M.D.   On: 09/24/2015 09:56        Scheduled Meds: . aMILoride  5 mg Oral Daily  . cyanocobalamin  1,000 mcg Intramuscular Daily  . enoxaparin (LOVENOX) injection  40 mg Subcutaneous Q24H  . levothyroxine  112 mcg Oral QAC breakfast  . sodium bicarbonate  1,300 mg Oral QID   Continuous Infusions: . 0.45 % NaCl with KCl 20 mEq / L 125 mL/hr at 09/26/15 0600     LOS: 3 days    Time spent: 25 minutes   Kathie Dike, MD [facility] Pager [phone]  If 7PM-7AM, please contact night-coverage [email or web address] Password [facility] 09/26/2015, 6:46 AM

## 2015-09-26 NOTE — Progress Notes (Signed)
Subjective: Interval History: Continue to feel better. Still she has some weakness  Objective: Vital signs in last 24 hours: Temp:  [97.3 F (36.3 C)-99.8 F (37.7 C)] 97.8 F (36.6 C) (06/18 0744) Pulse Rate:  [57-103] 71 (06/18 0600) Resp:  [17-30] 17 (06/18 0600) BP: (103-151)/(73-99) 137/79 mmHg (06/18 0600) SpO2:  [95 %-100 %] 98 % (06/18 0600) Weight:  [69.3 kg (152 lb 12.5 oz)] 69.3 kg (152 lb 12.5 oz) (06/18 0500) Weight change: 3.9 kg (8 lb 9.6 oz)  Intake/Output from previous day: 06/17 0701 - 06/18 0700 In: 4148 [P.O.:1800; I.V.:2348] Out: [id number] [Urine:4875] Intake/Output this shift: Total I/O In: 490 [P.O.:240; I.V.:250] Out: 1075 [Urine:1075]  General appearance: alert, cooperative and no distress Resp: clear to auscultation bilaterally Cardio: regular rate and rhythm, S1, S2 normal, no murmur, click, rub or gallop GI: soft, non-tender; bowel sounds normal; no masses,  no organomegaly Extremities: extremities normal, atraumatic, no cyanosis or edema  Lab Results:  Recent Labs  09/25/15 0520 09/26/15 0515  WBC 14.0* 12.1*  HGB 11.4* 11.3*  HCT 31.9* 32.3*  PLT 274 248   BMET:   Recent Labs  09/25/15 0520 09/25/15 1525 09/26/15 0515  NA 142  --  137  K 4.0 3.5 4.2  CL 119*  --  110  CO2 21*  --  24  GLUCOSE 167*  --  123*  BUN <5*  --  5*  CREATININE 0.73  --  0.59  CALCIUM 7.6*  --  8.0*   No results for input(s): PTH in the last 72 hours. Iron Studies: No results for input(s): IRON, TIBC, TRANSFERRIN, FERRITIN in the last 72 hours.  Studies/Results: US Renal  09/25/2015  CLINICAL DATA:  Urine retention for 2 days. EXAM: RENAL / URINARY TRACT ULTRASOUND COMPLETE COMPARISON:  None. FINDINGS: Right Kidney: Length: 10.2 cm. Echogenicity within normal limits. No mass or hydronephrosis visualized. Left Kidney: Length: 11.8 cm. Echogenicity within normal limits. No mass or hydronephrosis visualized. Bladder: Appears normal for degree of bladder  distention. Urinary Foley is in place. IMPRESSION: Normal appearance of bilateral kidneys. Moderate distention of the urinary bladder with Foley catheter in place. If the catheter had not been intentionally occluded, this suggests in proper draining of the catheter. Electronically Signed   By: Fidela Salisbury M.D.   On: 09/25/2015 13:47   Dg Chest Port 1 View  09/24/2015  CLINICAL DATA:  PICC line placement EXAM: PORTABLE CHEST 1 VIEW COMPARISON:  Portable exam 0935 hours compared to 09/23/2015 FINDINGS: RIGHT arm PICC line with tip projecting at confluence of the SVC. Normal heart size, mediastinal contours, and pulmonary vascularity. Mild RIGHT basilar atelectasis. Lungs otherwise clear. No pleural effusion or pneumothorax. IMPRESSION: Tip of RIGHT arm PICC line projects over the confluence of the SVC. Subsegmental atelectasis RIGHT base. Electronically Signed   By: Lavonia Dana M.D.   On: 09/24/2015 09:56    I have reviewed the patient's current medications.  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 Patient on potassium supplement and her potassium has normalized. Problem #2 metabolic acidosis: Non-anion gap. Most likely related to the above. Patient is on sodium bicarbonate orally and her CO2 continue to improve Problem #3 muscle weakness: Improving. Most likely associated to her low potassium Problem #4 elevated CPK: Possibly secondary to muscle injury associated to her hypokalemia. [name] is still increasing but rate os increase has slowed down. Her urine ph is 7.5 Problem #5 polyuria: Most likely secondary to recovery phase  of obstructive uropathy versus nephrogenic DI from her hypokalemia. Presently patient had about 4800 mL of urine output the last 24 hours. Has improved Problem #6 possible history of obstructive uropathy. Folly catheter is out and making urine Problem #7 leukocytosis: Etiology is not clear but her white blood cell count is  improving Problem #8 anemia Problem #9 hypothyroidism Plan: DC by mouth potassium 2] we'll change her IV fluid to ns normal saline  at 125 mL per hour 3] we'll increase  amiloride to 10 mg by mouth daily 5] d/c iv potassium and start her on kcl 40 meq po once a day 6] we'll check her renal panel in the morning 7] Decrease her sodium bicarbonate to 650 mg po tid    LOS: 3 days   Kellee Sittner S 09/26/2015,8:37 AM

## 2015-09-27 ENCOUNTER — Encounter: Payer: Medicaid Other | Admitting: Nurse Practitioner

## 2015-09-27 LAB — RENAL FUNCTION PANEL
ANION GAP: 4 — AB (ref 5–15)
Albumin: 2.9 g/dL — ABNORMAL LOW (ref 3.5–5.0)
BUN: 7 mg/dL (ref 6–20)
CHLORIDE: 108 mmol/L (ref 101–111)
CO2: 24 mmol/L (ref 22–32)
Calcium: 8.4 mg/dL — ABNORMAL LOW (ref 8.9–10.3)
Creatinine, Ser: 0.54 mg/dL (ref 0.44–1.00)
GFR calc non Af Amer: >60 mL/min (ref >60)
Glucose, Bld: 127 mg/dL — ABNORMAL HIGH (ref 65–99)
POTASSIUM: 4.6 mmol/L (ref 3.5–5.1)
Phosphorus: 2.5 mg/dL (ref 2.5–4.6)
Sodium: 136 mmol/L (ref 135–145)

## 2015-09-27 LAB — CBC
HEMATOCRIT: 35.2 % — AB (ref 36.0–46.0)
HEMOGLOBIN: 12.1 g/dL (ref 12.0–15.0)
MCH: 31.8 pg (ref 26.0–34.0)
MCHC: 34.4 g/dL (ref 30.0–36.0)
MCV: 92.4 fL (ref 78.0–100.0)
Platelets: 269 10*3/uL (ref 150–400)
RBC: 3.81 MIL/uL — AB (ref 3.87–5.11)
RDW: 14.6 % (ref 11.5–15.5)
WBC: 11.3 10*3/uL — ABNORMAL HIGH (ref 4.0–10.5)

## 2015-09-27 LAB — URINE MICROSCOPIC-ADD ON

## 2015-09-27 LAB — HOMOCYSTEINE: Homocysteine: 16.4 umol/L — ABNORMAL HIGH (ref 0.0–15.0)

## 2015-09-27 LAB — ANTI-PARIETAL ANTIBODY: PARIETAL CELL ANTIBODY-IGG: 9.1 U (ref 0.0–20.0)

## 2015-09-27 LAB — URINALYSIS, ROUTINE W REFLEX MICROSCOPIC
Bilirubin Urine: NEGATIVE
GLUCOSE, UA: NEGATIVE mg/dL
Ketones, ur: NEGATIVE mg/dL
NITRITE: NEGATIVE
PH: 6.5 (ref 5.0–8.0)
Protein, ur: NEGATIVE mg/dL
SPECIFIC GRAVITY, URINE: 1.01 (ref 1.005–1.030)

## 2015-09-27 LAB — FANA STAINING PATTERNS

## 2015-09-27 LAB — ANTINUCLEAR ANTIBODIES, IFA: ANA Ab, IFA: POSITIVE — AB

## 2015-09-27 MED ORDER — METHYLPREDNISOLONE SODIUM SUCC 1000 MG IJ SOLR
INTRAMUSCULAR | Status: AC
  Filled 2015-09-27: qty 8

## 2015-09-27 MED ORDER — FOLIC ACID 1 MG PO TABS
1.0000 mg | ORAL_TABLET | Freq: Every day | ORAL | Status: DC
  Administered 2015-09-28: 1 mg via ORAL
  Filled 2015-09-27: qty 1

## 2015-09-27 MED ORDER — SODIUM CHLORIDE 0.9 % IV SOLN
1000.0000 mg | Freq: Two times a day (BID) | INTRAVENOUS | Status: AC
  Administered 2015-09-27 – 2015-09-28 (×2): 1000 mg via INTRAVENOUS
  Filled 2015-09-27 (×2): qty 8

## 2015-09-27 MED ORDER — FOLIC ACID 5 MG/ML IJ SOLN
1.0000 mg | Freq: Every day | INTRAMUSCULAR | Status: DC
  Administered 2015-09-27: 1 mg via INTRAVENOUS
  Filled 2015-09-27 (×3): qty 0.2

## 2015-09-27 NOTE — Progress Notes (Signed)
[place] A. Merlene Laughter, MD     [email or web address]          Sharon Atkins is an 45 y.o. female.   Assessment/Plan: 1. 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 This has classically being seen in undiagnosed cases of thyroid disease particularly hyperthyroidism. In this situation however I suspect that the patient's thyroid disease is also the etiology. She is somewhat hyperthyroid likely due to replacement. There is no evidence of the diuretics or other medications at this time causing the severe hypo-kalemia. No evidence of nausea vomiting or other GI symptoms. No clear history of heavy alcohol use. Doubt primary aldosteronism. Overall, I think the patient's etiology is from thyroid disease.  2. 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    RECOMMENDATION: High dose steroids. Solu-Medrol 1 g 1 day and then weaning protocol afterwards.  Follow-up in 2 -4 weeks.  Arrange for muscle biopsy on discharge.       The patient's CPKs have increased significantly to 2000 despite the fact has had low activities while hospitalized and in fact has been in bed most times. 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 She reported that she is much improved. She still complains of having some weakness of muscle especially on the right side. She reports that she continues to have some issues with muscle soreness. She has has a history of muscle soreness for a long time which has been thought to be due to fibromyalgia. I did discuss with the mother and family the increase in her CPKs despite a 5 that she has been in bed most of the time. She also did have an episode of elevated CPK approximate 2 years ago. The mother believes it was an episode of sepsis. They checked the notes and the patient did have an episode of encephalopathy and apparently fell. The  elevated CPK at 2000 was thought to be due to the patient's falling.    GENERAL: Pleasant female who is not acutely distressed at this time. She does appear uncomfortable with the weakness and pain throughout however.  HEENT: Neck muscles with the neck flexion 4/5 in neck extension 4/5.  ABDOMEN: soft  EXTREMITIES: No edema   BACK: Normal.  SKIN: Normal by inspection.   MENTAL STATUS: Alert and oriented. Speech, language and cognition are generally intact. Judgment and insight normal.   CRANIAL NERVES: Pupils are equal, round and reactive to light and accomodation; extra ocular movements are full, there is no significant nystagmus; visual fields are full; upper and lower facial muscles are normal in strength and symmetric, there is no flattening of the nasolabial folds; tongue is midline; uvula is midline; shoulder elevation is normal.  MOTOR: Right deltoid 4/5; left deltoid 4+/5; right hip flexion 4/5 in left hip flexion 4+/5. Dorsiflexion also about 4/5 on the right and 4+ on the left.  [[[[[[[[[[[[[[PRIOR EXAM Deltoid 3/5 and hand movements approximately 3/5. Hip flexion 2/5 in dorsiflexion 0/5. Tone is reduced and bulk is normal.]]]]]]]]]]]]]]]]]]  COORDINATION: Left finger to nose is normal, right finger to nose is normal, No rest tremor; no intention tremor; no postural tremor; no bradykinesia.  REFLEXES: Deep tendon reflexes are symmetrical and normal.   SENSATION: Normal to light touch, temperature, and pinprick.    Objective: Vital signs in last 24 hours: Temp:  [97.8 F (36.6 C)-98.2 F (36.8 C)] 98.2 F (36.8 C) (06/19 1300) Pulse Rate:  [71-97] 97 (  06/19 1300) Resp:  [18-20] 18 (06/19 1300) BP: (114-133)/(75-81) 129/81 mmHg (06/19 1300) SpO2:  [98 %-99 %] 98 % (06/19 1300) Weight:  [148 lb 5.9 oz (67.3 kg)] 148 lb 5.9 oz (67.3 kg) (06/19 0500)  Intake/Output from previous day: 06/18 0701 - 06/19 0700 In: 3217.9 [P.O.:720; I.V.:2497.9] Out: 3532  [Urine:4375] Intake/Output this shift: Total I/O In: 480 [P.O.:480] Out: 3600 [Urine:3600] Nutritional status: Diet Heart Room service appropriate?: Yes; Fluid consistency:: Thin   Lab Results: Results for orders placed or performed during the hospital encounter of 09/23/15 (from the past 48 hour(s))  Urinalysis, Routine w reflex microscopic (not at [facility])     Status: None   Collection Time: 09/26/15  5:00 AM  Result Value Ref Range   Color, Urine YELLOW YELLOW   APPearance CLEAR CLEAR   Specific Gravity, Urine 1.010 1.005 - 1.030   pH 7.5 5.0 - 8.0   Glucose, UA NEGATIVE NEGATIVE mg/dL   Hgb urine dipstick NEGATIVE NEGATIVE   Bilirubin Urine NEGATIVE NEGATIVE   Ketones, ur NEGATIVE NEGATIVE mg/dL   Protein, ur NEGATIVE NEGATIVE mg/dL   Nitrite NEGATIVE NEGATIVE   Leukocytes, UA NEGATIVE NEGATIVE    Comment: MICROSCOPIC NOT DONE ON URINES WITH NEGATIVE PROTEIN, BLOOD, LEUKOCYTES, NITRITE, OR GLUCOSE <1000 mg/dL.  Renal function panel     Status: Abnormal   Collection Time: 09/26/15  5:15 AM  Result Value Ref Range   Sodium 137 135 - 145 mmol/L   Potassium 4.2 3.5 - 5.1 mmol/L   Chloride 110 101 - 111 mmol/L   CO2 24 22 - 32 mmol/L   Glucose, Bld 123 (H) 65 - 99 mg/dL   BUN 5 (L) 6 - 20 mg/dL   Creatinine, Ser 0.59 0.44 - 1.00 mg/dL   Calcium 8.0 (L) 8.9 - 10.3 mg/dL   Phosphorus 2.3 (L) 2.5 - 4.6 mg/dL   Albumin 2.7 (L) 3.5 - 5.0 g/dL   GFR calc non Af Amer >60 >60 mL/min   GFR calc Af Amer >60 >60 mL/min    Comment: (NOTE) The eGFR has been calculated using the CKD EPI equation. This calculation has not been validated in all clinical situations. eGFR's persistently <60 mL/min signify possible Chronic Kidney Disease.    Anion gap 3 (L) 5 - 15  CK     Status: Abnormal   Collection Time: 09/26/15  5:15 AM  Result Value Ref Range   Total CK 2601 (H) 38 - 234 U/L  CBC     Status: Abnormal   Collection Time: 09/26/15  5:15 AM  Result Value Ref Range   WBC 12.1 (H) 4.0  - 10.5 K/uL   RBC 3.55 (L) 3.87 - 5.11 MIL/uL   Hemoglobin 11.3 (L) 12.0 - 15.0 g/dL   HCT 32.3 (L) 36.0 - 46.0 %   MCV 91.0 78.0 - 100.0 fL   MCH 31.8 26.0 - 34.0 pg   MCHC 35.0 30.0 - 36.0 g/dL   RDW 14.5 11.5 - 15.5 %   Platelets 248 150 - 400 K/uL  Renal function panel     Status: Abnormal   Collection Time: 09/27/15  5:49 AM  Result Value Ref Range   Sodium 136 135 - 145 mmol/L   Potassium 4.6 3.5 - 5.1 mmol/L   Chloride 108 101 - 111 mmol/L   CO2 24 22 - 32 mmol/L   Glucose, Bld 127 (H) 65 - 99 mg/dL   BUN 7 6 - 20 mg/dL   Creatinine, Ser 0.54 0.44 -  1.00 mg/dL   Calcium 8.4 (L) 8.9 - 10.3 mg/dL   Phosphorus 2.5 2.5 - 4.6 mg/dL   Albumin 2.9 (L) 3.5 - 5.0 g/dL   GFR calc non Af Amer >60 >60 mL/min   GFR calc Af Amer >60 >60 mL/min    Comment: (NOTE) The eGFR has been calculated using the CKD EPI equation. This calculation has not been validated in all clinical situations. eGFR's persistently <60 mL/min signify possible Chronic Kidney Disease.    Anion gap 4 (L) 5 - 15  CBC     Status: Abnormal   Collection Time: 09/27/15  5:49 AM  Result Value Ref Range   WBC 11.3 (H) 4.0 - 10.5 K/uL   RBC 3.81 (L) 3.87 - 5.11 MIL/uL   Hemoglobin 12.1 12.0 - 15.0 g/dL   HCT 35.2 (L) 36.0 - 46.0 %   MCV 92.4 78.0 - 100.0 fL   MCH 31.8 26.0 - 34.0 pg   MCHC 34.4 30.0 - 36.0 g/dL   RDW 14.6 11.5 - 15.5 %   Platelets 269 150 - 400 K/uL  Urinalysis, Routine w reflex microscopic (not at [facility])     Status: Abnormal   Collection Time: 09/27/15  8:47 AM  Result Value Ref Range   Color, Urine STRAW (A) YELLOW   APPearance CLEAR CLEAR   Specific Gravity, Urine 1.010 1.005 - 1.030   pH 6.5 5.0 - 8.0   Glucose, UA NEGATIVE NEGATIVE mg/dL   Hgb urine dipstick TRACE (A) NEGATIVE   Bilirubin Urine NEGATIVE NEGATIVE   Ketones, ur NEGATIVE NEGATIVE mg/dL   Protein, ur NEGATIVE NEGATIVE mg/dL   Nitrite NEGATIVE NEGATIVE   Leukocytes, UA LARGE (A) NEGATIVE  Urine microscopic-add on      Status: Abnormal   Collection Time: 09/27/15  8:47 AM  Result Value Ref Range   Squamous Epithelial / LPF 0-5 (A) NONE SEEN   WBC, UA TOO NUMEROUS TO COUNT 0 - 5 WBC/hpf   RBC / HPF 0-5 0 - 5 RBC/hpf   Bacteria, UA MANY (A) NONE SEEN   Trichomonas, UA PRESENT     Lipid Panel No results for input(s): CHOL, TRIG, HDL, CHOLHDL, VLDL, LDLCALC in the last 72 hours.  Studies/Results:   Medications:  Scheduled Meds: . aMILoride  10 mg Oral Daily  . cyanocobalamin  1,000 mcg Intramuscular Daily  . enoxaparin (LOVENOX) injection  40 mg Subcutaneous Q24H  . [START ON 0/94/7096] folic acid  1 mg Oral Daily  . levothyroxine  112 mcg Oral QAC breakfast   Continuous Infusions: . sodium chloride 75 mL/hr at 09/27/15 0825   PRN Meds:.sodium chloride, acetaminophen, HYDROcodone-acetaminophen, LORazepam, morphine injection, ondansetron (ZOFRAN) IV, zolpidem     LOS: 4 days   Viana Sleep A. Merlene Laughter, M.D.  Diplomate, Tax adviser of Psychiatry and Neurology ( Neurology).

## 2015-09-27 NOTE — Progress Notes (Signed)
Initial Nutrition Assessment  INTERVENTION:  Heart Healthy diet   Offer snack from nourishment room as needed   NUTRITION DIAGNOSIS:   Inadequate oral intake related to acute illness as evidenced by per patient/family report.   GOAL: Pt to meet >/= 90% of their estimated nutrition needs      MONITOR:   PO intake, Weight trends, Labs  REASON FOR ASSESSMENT:   Consult Assessment of nutrition requirement/status  ASSESSMENT:   Pt is a 45 yo who presents with acute hypokalemia and increased weakness. Renal tubular acidosis. Her potassium has been repleted and diet is fully advanced. She is feeding herself and moving all extremities which she says she was unable to do on admission.    Pt self reports good appetite and her meal intake is good 50-75% of most meals. She has experienced some weight loss but is not considered significant.  NFPE- Pt is overweight. No muscle loss or excess subcutaneous fat loss found. Skin is smooth and uniform in color and nails are uniform in shape. Eyes are bright.   Recent Labs Lab 09/23/15 1236  09/24/15 0041 09/24/15 0700  09/25/15 0520 09/25/15 1525 09/26/15 0515 09/27/15 0549  NA 140  < > 144 143  --  142  --  137 136  K 1.4*  < > 1.4* 1.5*  < > 4.0 3.5 4.2 4.6  CL 113*  < > 123* 122*  --  119*  --  110 108  CO2 13*  < > 17* 17*  --  21*  --  24 24  BUN 10  < > 9 7  --  <5*  --  5* 7  CREATININE 0.99  < > 0.94 1.01*  --  0.73  --  0.59 0.54  CALCIUM 10.4*  < > 7.7* 8.5*  --  7.6*  --  8.0* 8.4*  MG 2.2  --  1.8 1.8  --   --   --   --   --   PHOS  --   --   --   --   --   --   --  2.3* 2.5  GLUCOSE 160*  < > 146* 153*  --  167*  --  123* 127*  < > = values in this interval not displayed.   Labs:potassium, mag and phos now -WNL  Meds/vitamin supplements: [id number] and folic acid  Diet Order:  Diet Heart Room service appropriate?: Yes; Fluid consistency:: Thin  Skin:   intact  Last BM:  09/27/15   Height:   Ht Readings from Last 1  Encounters:  09/23/15 5' (1.524 m)    Weight:   Wt Readings from Last 1 Encounters:  09/27/15 148 lb 5.9 oz (67.3 kg)    Ideal Body Weight:  45 kg  BMI:  Body mass index is 28.98 kg/(m^2).  Estimated Nutritional Needs:   Kcal:  [id number]  Protein:  80 kg  Fluid:  1.7-1.9 liters daily  EDUCATION NEEDS:   No education needs identified at this time  Colman Cater MS,RD,CSG,LDN Office: [phone] Pager: [phone]

## 2015-09-27 NOTE — Progress Notes (Signed)
PROGRESS NOTE    Sharon Atkins  [id number] DOB: Apr 21, 1970 DOA: 09/23/2015 PCP: Mickie Hillier, MD Outpatient Specialists:    Brief Narrative: 66 yof presented to the ED for generalized weakness. She was found to have severe hypokalemia at 1.5. She is found to be hypotensive and was admitted to stepdown. Initial concern for sepsis, however, patient has been afebrile and no obvious source of infection has been noted, so antibiotics have been discontinued for now. Neurology evaluated the patient felt a weakness was related to hypokalemia. She had a mild elevation of troponin. Cardiology saw the patient felt it was demand ischemia. Nephrology following and assisting with further work up of hypokalemia. Will continue to monitor.   Assessment & Plan:   Principal Problem:   Acute hypokalemia Active Problems:   Hypertension   Hypothyroidism   Weakness generalized   Sepsis (HCC)   Elevated troponin I level   Elevated CK   Urinary retention  1. Acute hypokalemia. Resolved. Etiology unclear as she has not had any significant GI losses and she is not on any diuretics. Urine sodium 32, urine potassium 9. Discussed with Nephrology due to hypokalemia and concurrent metabolic acidosis, there are concerns are for underlying RTA type 1 as etiology. Potassium improved with supplementation. Continue on amiloride. 2. Generalized weakness. Likely secondary to acute hypokalemia. PT evaluation requested.  3. Hypotension. Likely related to volume depletion, resolved with hydration. Initially there were concerns for sepsis and she was started on vancomycin and Zosyn. She's been afebrile, normal lactic acid. No obvious focus of infection. Leukocytosis is likely reactive and has trended down with IVF. Cultures thus far has been unremarkable. Antibiotics have been discontinued. Will continue to observe for now. 4. History of hypothyroidism. T4 elevated at 1.62. TSH found to be low at 0.232. Likely  overcorrected with supplementation. Follow up with endocrinology. 5. Hypertension. Lisinporil currently on hold due to borderline blood pressures on admission. 6. Elevated troponin. Cardiology input appreciated. Likely demand ischemia. Echocardiogram grossly normal as below. 7. Elevated CK. Possibly related to severe hypokalemia leading to rhabdomyolysis. Continue with IVF and monitor renal function. Recheck CK in AM 8. Urinary retention. Unclear etiology. Patient had post void residual greater than 700cc in bladder on admission. Foley catheter was placed. ? Related to electrolyte abnormalities. She has passed her voiding trial and is passing urine without difficulty 9. B12 deficiency. Possibly related to nutritional deficiency. Intrinsic factor abs and anti-parietal abs are in process. Continue  Supplementation. Folate is also low, will replace   DVT prophylaxis: Lovenox Code Status: Full Family Communication: discussed with patient at the bedside  Disposition Plan: discharge home once improved   Consultants:   Neurology  Cardiology  Nephrology  Procedures:   2D echo Study Conclusions  - Left ventricle: The cavity size was normal. Wall thickness was at  the upper limits of normal. Systolic function was vigorous. The  estimated ejection fraction was in the range of 65% to 70%. Wall  motion was normal; there were no regional wall motion  abnormalities. Left ventricular diastolic function parameters  were normal. - Aortic valve: There was trivial regurgitation. - Mitral valve: There was mild regurgitation. - Right atrium: Central venous pressure (est): 3 mm Hg. - Tricuspid valve: There was trivial regurgitation. - Pulmonary arteries: PA peak pressure: 19 mm Hg (S). - Pericardium, extracardiac: There was no pericardial effusion.  Antimicrobials:   Vancomycin 6/15 >>6/16  Zosyn 6/15 >> 6/16  Subjective: Feeling better. Still has some muscle  soreness  Objective: Filed Vitals:   09/26/15 1200 09/26/15 1202 09/26/15 2100 09/27/15 0500  BP: 132/88  133/78 114/75  Pulse: 82  71 77  Temp:  97.9 F (36.6 C) 97.8 F (36.6 C) 98 F (36.7 C)  TempSrc:  Oral Oral Oral  Resp: 23  20 18   Height:      Weight:    67.3 kg (148 lb 5.9 oz)  SpO2: 100%  99%     Intake/Output Summary (Last 24 hours) at 09/27/15 0842 Last data filed at 09/27/15 [id number]  Gross per 24 hour  Intake 2487.92 ml  Output   3300 ml  Net -812.08 ml   Filed Weights   09/25/15 0400 09/26/15 0500 09/27/15 0500  Weight: 69.4 kg (153 lb) 69.3 kg (152 lb 12.5 oz) 67.3 kg (148 lb 5.9 oz)    Examination:  General exam: Appears calm and comfortable  Respiratory system: Clear to auscultation. Respiratory effort normal. Cardiovascular system: S1 & S2 heard, RRR. No JVD, murmurs, rubs, gallops or clicks. No pedal edema. Gastrointestinal system: Abdomen is nondistended, soft and nontender. No organomegaly or masses felt. Normal bowel sounds heard. Central nervous system: Alert and oriented. No focal neurological deficits. Extremities: Symmetric 5 x 5 power. Skin: No rashes, lesions or ulcers Psychiatry: Judgement and insight appear normal. Mood & affect appropriate.   Data Reviewed: I have personally reviewed following labs and imaging studies  CBC:  Recent Labs Lab 09/23/15 1236 09/25/15 0520 09/26/15 0515 09/27/15 0549  WBC 28.4* 14.0* 12.1* 11.3*  NEUTROABS 23.6*  --   --   --   HGB 18.0* 11.4* 11.3* 12.1  HCT 49.3* 31.9* 32.3* 35.2*  MCV 88.2 89.6 91.0 92.4  PLT 443* 274 248 [id number]   Basic Metabolic Panel:  Recent Labs Lab 09/23/15 1236  09/24/15 0041 09/24/15 0700 09/24/15 1600 09/25/15 0520 09/25/15 1525 09/26/15 0515 09/27/15 0549  NA 140  < > 144 143  --  142  --  137 136  K 1.4*  < > 1.4* 1.5* 2.3* 4.0 3.5 4.2 4.6  CL 113*  < > 123* 122*  --  119*  --  110 108  CO2 13*  < > 17* 17*  --  21*  --  24 24  GLUCOSE 160*  < > 146* 153*  --   167*  --  123* 127*  BUN 10  < > 9 7  --  <5*  --  5* 7  CREATININE 0.99  < > 0.94 1.01*  --  0.73  --  0.59 0.54  CALCIUM 10.4*  < > 7.7* 8.5*  --  7.6*  --  8.0* 8.4*  MG 2.2  --  1.8 1.8  --   --   --   --   --   PHOS  --   --   --   --   --   --   --  2.3* 2.5  < > = values in this interval not displayed. GFR: Estimated Creatinine Clearance: 75.2 mL/min (by C-G formula based on Cr of 0.54). Liver Function Tests:  Recent Labs Lab 09/23/15 1242 09/26/15 0515 09/27/15 0549  AST 22  --   --   ALT 13*  --   --   ALKPHOS 84  --   --   BILITOT 0.9  --   --   PROT 8.7*  --   --   ALBUMIN 4.7 2.7* 2.9*   No results for input(s): LIPASE, AMYLASE in the  last 168 hours. No results for input(s): AMMONIA in the last 168 hours. Coagulation Profile: No results for input(s): INR, PROTIME in the last 168 hours. Cardiac Enzymes:  Recent Labs Lab 09/23/15 1236 09/23/15 2242 09/24/15 0700 09/25/15 0520 09/26/15 0515  CKTOTAL 523* 331* 263* 1876* 2601*  TROPONINI 0.08* 0.13* 0.08*  --   --    BNP (last 3 results) No results for input(s): PROBNP in the last 8760 hours. HbA1C: No results for input(s): HGBA1C in the last 72 hours. CBG:  Recent Labs Lab 09/23/15 1222  GLUCAP 165*   Lipid Profile: No results for input(s): CHOL, HDL, LDLCALC, TRIG, CHOLHDL, LDLDIRECT in the last 72 hours. Thyroid Function Tests: No results for input(s): TSH, T4TOTAL, FREET4, T3FREE, THYROIDAB in the last 72 hours. Anemia Panel:  Recent Labs  09/25/15 1131  FOLATE 4.3*   Urine analysis:    Component Value Date/Time   COLORURINE YELLOW 09/26/2015 0500   APPEARANCEUR CLEAR 09/26/2015 0500   LABSPEC 1.010 09/26/2015 0500   PHURINE 7.5 09/26/2015 0500   GLUCOSEU NEGATIVE 09/26/2015 0500   HGBUR NEGATIVE 09/26/2015 0500   BILIRUBINUR NEGATIVE 09/26/2015 0500   KETONESUR NEGATIVE 09/26/2015 0500   PROTEINUR NEGATIVE 09/26/2015 0500   UROBILINOGEN 0.2 11/21/2013 0505   NITRITE NEGATIVE  09/26/2015 0500   LEUKOCYTESUR NEGATIVE 09/26/2015 0500   Sepsis Labs: @LABRCNTIP (procalcitonin:4,lacticidven:4)  ) Recent Results (from the past 240 hour(s))  Blood culture (routine x 2)     Status: None (Preliminary result)   Collection Time: 09/23/15 12:36 PM  Result Value Ref Range Status   Specimen Description BLOOD LEFT ANTECUBITAL  Final   Special Requests   Final    BOTTLES DRAWN AEROBIC AND ANAEROBIC AEB=7CC ANA=5CC   Culture NO GROWTH 3 DAYS  Final   Report Status PENDING  Incomplete  Blood culture (routine x 2)     Status: None (Preliminary result)   Collection Time: 09/23/15 12:42 PM  Result Value Ref Range Status   Specimen Description BLOOD RIGHT FOREARM DRAWN BY RN  Final   Special Requests BOTTLES DRAWN AEROBIC AND ANAEROBIC 5CC EACH  Final   Culture NO GROWTH 3 DAYS  Final   Report Status PENDING  Incomplete  Urine culture     Status: None   Collection Time: 09/23/15  2:11 PM  Result Value Ref Range Status   Specimen Description URINE, CATHETERIZED  Final   Special Requests NONE  Final   Culture NO GROWTH Performed at [facility]   Final   Report Status 09/25/2015 FINAL  Final  MRSA PCR Screening     Status: None   Collection Time: 09/23/15  7:00 PM  Result Value Ref Range Status   MRSA by PCR NEGATIVE NEGATIVE Final    Comment:        The GeneXpert MRSA Assay (FDA approved for NASAL specimens only), is one component of a comprehensive MRSA colonization surveillance program. It is not intended to diagnose MRSA infection nor to guide or monitor treatment for MRSA infections.          Radiology Studies: US Renal  09/25/2015  CLINICAL DATA:  Urine retention for 2 days. EXAM: RENAL / URINARY TRACT ULTRASOUND COMPLETE COMPARISON:  None. FINDINGS: Right Kidney: Length: 10.2 cm. Echogenicity within normal limits. No mass or hydronephrosis visualized. Left Kidney: Length: 11.8 cm. Echogenicity within normal limits. No mass or hydronephrosis  visualized. Bladder: Appears normal for degree of bladder distention. Urinary Foley is in place. IMPRESSION: Normal appearance of bilateral kidneys.  Moderate distention of the urinary bladder with Foley catheter in place. If the catheter had not been intentionally occluded, this suggests in proper draining of the catheter. Electronically Signed   By: Fidela Salisbury M.D.   On: 09/25/2015 13:47        Scheduled Meds: . aMILoride  10 mg Oral Daily  . cyanocobalamin  1,000 mcg Intramuscular Daily  . enoxaparin (LOVENOX) injection  40 mg Subcutaneous Q24H  . levothyroxine  112 mcg Oral QAC breakfast   Continuous Infusions: . sodium chloride 75 mL/hr at 09/27/15 0825     LOS: 4 days    Time spent: 25 minutes   Kathie Dike, MD [facility] Pager [phone]  If 7PM-7AM, please contact night-coverage [email or web address] Password [facility] 09/27/2015, 8:42 AM

## 2015-09-27 NOTE — Progress Notes (Signed)
Subjective: Interval History: Continue to feel better. Still she has some weakness  Objective: Vital signs in last 24 hours: Temp:  [97.8 F (36.6 C)-98 F (36.7 C)] 98 F (36.7 C) (06/19 0500) Pulse Rate:  [69-89] 77 (06/19 0500) Resp:  [17-23] 18 (06/19 0500) BP: (114-133)/(75-94) 114/75 mmHg (06/19 0500) SpO2:  [97 %-100 %] 99 % (06/18 2100) Weight:  [67.3 kg (148 lb 5.9 oz)] 67.3 kg (148 lb 5.9 oz) (06/19 0500) Weight change: -2 kg (-4 lb 6.6 oz)  Intake/Output from previous day: 06/18 0701 - 06/19 0700 In: 3217.9 [P.O.:720; I.V.:2497.9] Out: 4375 [Urine:4375] Intake/Output this shift:    General appearance: alert, cooperative and no distress Resp: clear to auscultation bilaterally Cardio: regular rate and rhythm, S1, S2 normal, no murmur, click, rub or gallop GI: soft, non-tender; bowel sounds normal; no masses,  no organomegaly Extremities: extremities normal, atraumatic, no cyanosis or edema  Lab Results:  Recent Labs  09/26/15 0515 09/27/15 0549  WBC 12.1* 11.3*  HGB 11.3* 12.1  HCT 32.3* 35.2*  PLT 248 269   BMET:   Recent Labs  09/26/15 0515 09/27/15 0549  NA 137 136  K 4.2 4.6  CL 110 108  CO2 24 24  GLUCOSE 123* 127*  BUN 5* 7  CREATININE 0.59 0.54  CALCIUM 8.0* 8.4*   No results for input(s): PTH in the last 72 hours. Iron Studies: No results for input(s): IRON, TIBC, TRANSFERRIN, FERRITIN in the last 72 hours.  Studies/Results: US Renal  09/25/2015  CLINICAL DATA:  Urine retention for 2 days. EXAM: RENAL / URINARY TRACT ULTRASOUND COMPLETE COMPARISON:  None. FINDINGS: Right Kidney: Length: 10.2 cm. Echogenicity within normal limits. No mass or hydronephrosis visualized. Left Kidney: Length: 11.8 cm. Echogenicity within normal limits. No mass or hydronephrosis visualized. Bladder: Appears normal for degree of bladder distention. Urinary Foley is in place. IMPRESSION: Normal appearance of bilateral kidneys. Moderate distention of the urinary  bladder with Foley catheter in place. If the catheter had not been intentionally occluded, this suggests in proper draining of the catheter. Electronically Signed   By: Fidela Salisbury M.D.   On: 09/25/2015 13:47    I have reviewed the patient's current medications.  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 Patient on 40 mEq of KCl per day plus amiloride 10 mg by mouth daily her potassium remains normal.. Problem #2 metabolic acidosis: Non-anion gap. Most likely related to the above. Patient is on sodium bicarbonate orally and her CO2 continue to improve Problem #3 muscle weakness: Improving. Patient still states that she has some weakness but is not as bad as before. Problem #4 elevated CPK: Possibly secondary to muscle injury associated to her hypokalemia. [name] is still increasing but rate os increase has slowed down. Her urine ph is 7.5 Problem #5 polyuria: Most likely secondary to recovery phase of obstructive uropathy versus nephrogenic DI from her hypokalemia. Foley catheter is removed. Presently stationed in his urine she has about 4300 mL of urine output. Problem #6 possible history of obstructive uropathy. Folly catheter is out and making urine Problem #7 leukocytosis: Etiology is not clear but her white blood cell count is improving Problem #8 anemia Problem #9 hypothyroidism Plan: 1] we'll DC sodium bicarbonate 2] we'll DC potassium supplement 3] we'll decrease IV fluid to 75 mL per hour 4] we'll check [name] in the morning and also her renal panel.    LOS: 4 days   Sharon Atkins S 09/27/2015,8:09 AM

## 2015-09-28 DIAGNOSIS — N2589 Other disorders resulting from impaired renal tubular function: Secondary | ICD-10-CM

## 2015-09-28 LAB — CULTURE, BLOOD (ROUTINE X 2)
CULTURE: NO GROWTH
Culture: NO GROWTH

## 2015-09-28 LAB — INTRINSIC FACTOR ANTIBODIES: INTRINSIC FACTOR: 0.9 [AU]/ml (ref 0.0–1.1)

## 2015-09-28 LAB — RENAL FUNCTION PANEL
ALBUMIN: 3.2 g/dL — AB (ref 3.5–5.0)
ANION GAP: 5 (ref 5–15)
BUN: 10 mg/dL (ref 6–20)
CALCIUM: 8.3 mg/dL — AB (ref 8.9–10.3)
CO2: 20 mmol/L — AB (ref 22–32)
CREATININE: 0.68 mg/dL (ref 0.44–1.00)
Chloride: 109 mmol/L (ref 101–111)
GFR calc non Af Amer: >60 mL/min (ref >60)
GLUCOSE: 195 mg/dL — AB (ref 65–99)
PHOSPHORUS: 2.2 mg/dL — AB (ref 2.5–4.6)
Potassium: 4.9 mmol/L (ref 3.5–5.1)
SODIUM: 134 mmol/L — AB (ref 135–145)

## 2015-09-28 LAB — CK: Total CK: 441 U/L — ABNORMAL HIGH (ref 38–234)

## 2015-09-28 MED ORDER — VITAMIN B-12 1000 MCG PO TABS: 1000.0000 ug | ORAL_TABLET | Freq: Every day | ORAL | Status: DC

## 2015-09-28 MED ORDER — FOLIC ACID 1 MG PO TABS: 1.0000 mg | ORAL_TABLET | Freq: Every day | ORAL | Status: DC

## 2015-09-28 MED ORDER — AMILORIDE HCL 5 MG PO TABS: 5.0000 mg | ORAL_TABLET | Freq: Every day | ORAL | Status: DC

## 2015-09-28 MED ORDER — METHYLPREDNISOLONE SODIUM SUCC 1000 MG IJ SOLR
INTRAMUSCULAR | Status: AC
  Filled 2015-09-28: qty 8

## 2015-09-28 MED ORDER — LEVOTHYROXINE SODIUM 100 MCG PO TABS: 100.0000 ug | ORAL_TABLET | Freq: Every day | ORAL | Status: DC

## 2015-09-28 MED ORDER — AMILORIDE HCL 5 MG PO TABS
5.0000 mg | ORAL_TABLET | Freq: Every day | ORAL | Status: DC
  Administered 2015-09-28: 5 mg via ORAL
  Filled 2015-09-28 (×2): qty 1

## 2015-09-28 MED ORDER — PREDNISONE 10 MG PO TABS: ORAL_TABLET | ORAL | Status: DC

## 2015-09-28 NOTE — Evaluation (Signed)
Physical Therapy Evaluation Patient Details Name: Sharon Atkins MRN: [id number] DOB: 1970-11-02 Today's Date: 09/28/2015   History of Present Illness  45 yo F admitted 09/23/2015 s/p fall on 6/15 with acute hypokalemia and increased weakness, and episode of seizure like activity 6/16. Mildly elevated troponin in the setting of severe hypokalemia and tachycardia. Dx: concerns for RTA type 1.  PMH: hypothyroidism, goiter, HTN, arthritis, depression, fibromyalgia, anxiety, memory loss, chornic pain, ADD, tobacco abuse, appendectomy  Clinical Impression  Pt received sitting up on the EOB, and was agreeable to PT evaluation.  Pt states that she is normally independent with ambulation, ADL's, IADL's, and driving.  She currently is not working.  She has had 2 falls just prior to her hospitalization here at [facility] where she tripped on the threshold going into her house, and then she fell down the steps going out of her house when going out to get in her friends car to come to the ED.  Currently, pt demonstrates all functional mobility at supervision/Mod (I) level, however she demonstrates decreased both LE and UE strength most notably on the right.  She also demonstrates decreased gait velocity at 1.58f/sec putting her at increased risk for future falls.  At this point she does not demonstrate need for acute PT, and was encouraged to be out of the bed, and ambulate in the hallway.  However, she would benefit from OPPT to further address strength, balance, and endurance deficits noted today.      Follow Up Recommendations Outpatient PT    Equipment Recommendations  None recommended by PT    Recommendations for Other Services       Precautions / Restrictions Precautions Precautions: Fall Precaution Comments: Pt reports that she had 2 falls just prior to being admitted.  Pt states that the first time was because her flip flop got caught on the threshold to the front door, and then she fell again when she  was coming out of the house down the steps to come to the ED.   Restrictions Weight Bearing Restrictions: No      Mobility  Bed Mobility Overal bed mobility: Modified Independent                Transfers Overall transfer level: Modified independent                  Ambulation/Gait Ambulation/Gait assistance: Supervision Ambulation Distance (Feet): 500 Feet Assistive device: None Gait Pattern/deviations: Step-through pattern Gait velocity: 1.5 ft/sec Gait velocity interpretation: <1.8 ft/sec, indicative of risk for recurrent falls General Gait Details: Pt demonstrates increased guarding with gait - holding UE's in high guard position.  Encouraged to relax B UE's and promoted B UE swing during gait.  Pt was able to initiate, but not sustain especially when given balance tasks to complete during gait.  Pt expressed that she is not ambulating at her normal velocity - normally she is much faster.   Stairs Stairs: Yes Stairs assistance: Modified independent (Device/Increase time) Stair Management: Two rails Number of Stairs: 10 General stair comments: going up pt used reciprocal pattern, coming down - she lead with the R foot and used a step to pattern stating that coming down was harder.   Wheelchair Mobility    Modified Rankin (Stroke Patients Only)       Balance Overall balance assessment: History of Falls;Needs assistance           Standing balance-Leahy Scale: Fair   Single Leg Stance - Right Leg: 5  Single Leg Stance - Left Leg: 8         High level balance activites: Head turns High Level Balance Comments: Pt was able to maintain slow gait speed while performing horizontal head turns, but demonstrated instability with vertical head turns - able to self correct via stepping strategy             Pertinent Vitals/Pain Pain Assessment: 0-10 Pain Score: 5  Pain Location: R side Pain Descriptors / Indicators: Throbbing Pain Intervention(s):  Limited activity within patient's tolerance    Home Living   Living Arrangements: Children (66 yo son)   Type of Home: Mobile home Home Access: Stairs to enter Entrance Stairs-Rails: Can reach both Entrance Stairs-Number of Steps: 8 Home Layout: One level Home Equipment: None      Prior Function     Gait / Transfers Assistance Needed: Pt noticed she was weak for a few weaks prior to admission.  The day prior to admission, her son was assisting her with walking.    ADL's / Homemaking Assistance Needed: Pt was independent with ADL's, and IADL's, driving, Not working.          Hand Dominance   Dominant Hand: Right (currently having difficulty feeding herself because of weakness on the right. )    Extremity/Trunk Assessment   Upper Extremity Assessment: Generalized weakness;RUE deficits/detail RUE Deficits / Details: shoulder flexion limited  due to weakness, Able to achieve ~110* of flexion before demonstrating compensatory movement into abduction.  Shoulder flexion strength at 3+/5, Elbow flexion: 3/5, and elbow extension at 3/5.  Gross motor seems to be generally slowed throughout entire body.      LUE Deficits / Details: WFL   Lower Extremity Assessment: Generalized weakness;RLE deficits/detail;LLE deficits/detail RLE Deficits / Details: AROM is WFL.  Knee extension: 3-/5, hip flexion: 3-/5, and ankle DF: 3/5 LLE Deficits / Details: AROM is WFL.  Knee extension: 3+/5, hip flexion: 3+/5, and ankle DF: 4/5.     Communication   Communication: No difficulties  Cognition Arousal/Alertness: Awake/alert Behavior During Therapy: WFL for tasks assessed/performed Overall Cognitive Status: Within Functional Limits for tasks assessed                      General Comments      Exercises        Assessment/Plan    PT Assessment All further PT needs can be met in the next venue of care  PT Diagnosis Generalized weakness;Abnormality of gait;Difficulty walking   PT  Problem List Decreased strength;Decreased activity tolerance;Decreased balance;Decreased mobility;Decreased safety awareness;Decreased knowledge of precautions  PT Treatment Interventions     PT Goals (Current goals can be found in the Care Plan section) Acute Rehab PT Goals Patient Stated Goal: Pt wants to go home and be able to move better.  PT Goal Formulation: With patient Time For Goal Achievement: 10/05/15 Potential to Achieve Goals: Good    Frequency     Barriers to discharge Decreased caregiver support      Co-evaluation               End of Session Equipment Utilized During Treatment: Gait belt Activity Tolerance: Patient tolerated treatment well Patient left: in chair;with call bell/phone within reach Nurse Communication: Mobility status         Time: 7342-8768 PT Time Calculation (min) (ACUTE ONLY): 27 min   Charges:   PT Evaluation $PT Eval Low Complexity: 1 Procedure PT Treatments $Gait Training: 8-22 mins  PT G Codes:        Beth Nathan Stallworth, PT, DPT X: [phone]

## 2015-09-28 NOTE — Discharge Summary (Signed)
Physician Discharge Summary  Sharon Atkins [id number] DOB: 1971-04-09 DOA: 09/23/2015  PCP: Mickie Hillier, MD  Admit date: 09/23/2015 Discharge date: 09/28/2015  Time spent: 35 minutes  Recommendations for Outpatient Follow-up:  1. Follow up with PCP in 1 week 2. Discharge with outpatient PT 3. Levothyroxine dose decreased to 138mcg daily, repeat thyroid studies in 4 weeks 4. Lisinopril discontinued due to borderline blood pressures 5. Follow up with neurology in 2- 4weeks 6. Follow up with nephrology in 4 weeks 7. Repeat bmet in 1 week    Discharge Diagnoses:  Principal Problem:   Acute hypokalemia Active Problems:   Hypertension   Hypothyroidism   Weakness generalized   Elevated troponin I level   Elevated CK   Urinary retention   RTA (renal tubular acidosis)   Discharge Condition: improved  Diet recommendation: heart healthy  Filed Weights   09/26/15 0500 09/27/15 0500 09/28/15 0541  Weight: 69.3 kg (152 lb 12.5 oz) 67.3 kg (148 lb 5.9 oz) 62.3 kg (137 lb 5.6 oz)    History of present illness:  24 yof with a hx of hypothyroidism, HTN, arthritis, presented to the ED with complaints of acute onset, worsening, generalized weakness that began the day before admission. She reported needing significant assistance while moving about the house. She denied any similar past episodes. She reported some mild cramping in her extremities, but denied any other pertinent negatives or positives.  Hospital Course:  Patient was found to have acute hypokalemia.  Nephrology was also consulted who thought that due to her hypokalemia and concurrent metabolic acidosis, there was concern for underlying RTA type 1 or 2. Initially, replacement proved difficult due to her continued high urine output, however, it eventually began to show marked improvement. Treatment via potassium supplementation and amiloride was continued and her potassium levels soon returned to normal levels. She was also  treated with sodium bicarbonate supplementation for metabolic acidosis. since that time, her serum bicarb has improved. She will follow up with nephrology in 3-4 weeks.  Neurology evaluated the patient and felt that her generalized weakness was due to her severe hypokalemia. Her symptoms improved with continued potassium supplementation and amiloride treatment. Due to her mildly elevated CK and muscle soreness, there was concern for developing myopathy. Per neurology, she received high dose intravenous steroids and started on prednisone taper. She will follow up with neurology and be considered for possible muscle biopsy. The patient was also evaluated by physical therapy, who recommended outpatient physical therapy.  Lastly, During initial evaluation in the ED, her troponin was noted to be elevated. Cardiology was consulted, and after her echocardiogram returned grossly normal, it was felt that this problem was likely due to demand ischemia. Her troponin quickly began to trend down with administration of IVFs.   Procedures:  2D echo Study Conclusions  - Left ventricle: The cavity size was normal. Wall thickness was at  the upper limits of normal. Systolic function was vigorous. The  estimated ejection fraction was in the range of 65% to 70%. Wall  motion was normal; there were no regional wall motion  abnormalities. Left ventricular diastolic function parameters  were normal. - Aortic valve: There was trivial regurgitation. - Mitral valve: There was mild regurgitation. - Right atrium: Central venous pressure (est): 3 mm Hg. - Tricuspid valve: There was trivial regurgitation. - Pulmonary arteries: PA peak pressure: 19 mm Hg (S). - Pericardium, extracardiac: There was no pericardial effusion.  Consultations:  Neurology  Cardiology  Nephrology  Discharge Exam: Danley Danker  Vitals:   09/27/15 2103 09/28/15 0541  BP: 128/71 109/71  Pulse: 66 83  Temp: 98.3 F (36.8 C) 97.7 F (36.5  C)  Resp: 18 18   Examination:  General exam: Appears calm and comfortable  Respiratory system: Clear to auscultation. Respiratory effort normal. Cardiovascular system: S1 & S2 heard, RRR. No JVD, murmurs, rubs, gallops or clicks. No pedal edema. Gastrointestinal system: Abdomen is nondistended, soft and nontender. No organomegaly or masses felt. Normal bowel sounds heard. Central nervous system: Alert and oriented. No focal neurological deficits. Extremities: Symmetric 5 x 5 power. Skin: No rashes, lesions or ulcers Psychiatry: Judgement and insight appear normal. Mood & affect appropriate.    Discharge Instructions   Discharge Instructions    Diet - low sodium heart healthy    Complete by:  As directed      Increase activity slowly    Complete by:  As directed           Current Discharge Medication List    START taking these medications   Details  aMILoride (MIDAMOR) 5 MG tablet Take 1 tablet (5 mg total) by mouth daily. Qty: 30 tablet, Refills: 0    cyanocobalamin (CYANOCOBALAMIN) 1000 MCG tablet Take 1 tablet (1,000 mcg total) by mouth daily. Qty: 30 tablet, Refills: 0    folic acid (FOLVITE) 1 MG tablet Take 1 tablet (1 mg total) by mouth daily. Qty: 30 tablet, Refills: 0    predniSONE (DELTASONE) 10 MG tablet Take 40mg  daily for 1 day then 30mg  daily for 1 day then 20mg  daily for 1 day then 10mg  daily for 1 day then stop Qty: 10 tablet, Refills: 0      CONTINUE these medications which have CHANGED   Details  levothyroxine (SYNTHROID, LEVOTHROID) 100 MCG tablet Take 1 tablet (100 mcg total) by mouth daily. Qty: 30 tablet, Refills: 0      CONTINUE these medications which have NOT CHANGED   Details  ibuprofen (ADVIL,MOTRIN) 200 MG tablet Take 200 mg by mouth every 6 (six) hours as needed for moderate pain.      STOP taking these medications     lisinopril (PRINIVIL,ZESTRIL) 10 MG tablet      cefPROZIL (CEFZIL) 500 MG tablet      HYDROcodone-acetaminophen  (NORCO) 10-325 MG tablet        Allergies  Allergen Reactions  . Hct [Hydrochlorothiazide] Other (See Comments)    "affects potassium more than normal"  . Lyrica [Pregabalin] Other (See Comments)    "makes her feel stupid"   Follow-up Information    Follow up with [facility], MD. Schedule an appointment as soon as possible for a visit in 4 weeks.   Specialty:  Nephrology   Contact information:   [address] [address] [phone]       Follow up with [facility], KOFI, MD. Schedule an appointment as soon as possible for a visit in 2 weeks.   Specialty:  Neurology   Contact information:   [address] [place] [address] [phone]       Follow up with Mickie Hillier, MD. Schedule an appointment as soon as possible for a visit in 1 week.   Specialty:  Family Medicine   Contact information:   [address] [place] [address] [phone]        The results of significant diagnostics from this hospitalization (including imaging, microbiology, ancillary and laboratory) are listed below for reference.    Significant Diagnostic  Studies: Dg Chest 1 View  09/23/2015  CLINICAL DATA:  Weakness since yesterday.  Unable to move. EXAM: CHEST 1 VIEW COMPARISON:  Chest x-rays dated 06/17/2015 and 05/17/2013. FINDINGS: Study is hypoinspiratory with crowding of the perihilar and bibasilar bronchovascular markings. Given the low lung volumes, lungs are clear. No evidence of pneumonia. No pleural effusion or pneumothorax. Osseous and soft tissue structures about the chest are unremarkable. IMPRESSION: Low lung volumes.  No active disease. Electronically Signed   By: Franki Cabot M.D.   On: 09/23/2015 13:44   Ct Head Wo Contrast  09/23/2015  CLINICAL DATA:  Diffuse upper and lower extremity weakness. EXAM: CT HEAD WITHOUT CONTRAST CT CERVICAL SPINE WITHOUT CONTRAST TECHNIQUE: Multidetector CT imaging of the head and cervical spine was  performed following the standard protocol without intravenous contrast. Multiplanar CT image reconstructions of the cervical spine were also generated. COMPARISON:  Head CT May 17, 2013; cervical MRI September 17, 2012; CT cervical spine September 16, 2012 FINDINGS: CT HEAD FINDINGS The ventricles are normal in size and configuration. There is no intracranial mass, hemorrhage, extra-axial fluid collection, or midline shift. Gray-white compartments appear normal. No acute infarct evident. Bony calvarium appears intact. The mastoid air cells are clear. No intraorbital lesions are evident. CT CERVICAL SPINE FINDINGS There is no fracture or spondylolisthesis. Prevertebral soft tissues and predental space regions are normal. The disc spaces appear unremarkable. There is facet hypertrophy at several levels bilaterally. No nerve root edema or effacement is evident on this study. No disc extrusion or stenosis. IMPRESSION: CT head:  Study within normal limits. CT cervical spine: No fracture or spondylolisthesis. Relatively mild osteoarthritic changes noted at several levels. No nerve root edema or effacement is apparent. No disc extrusion or stenosis. Should be noted that assessment of the cervical cord is quite limited on noncontrast enhanced cervical spine CT. On most recent thoracolumbar from 2014, the patient did have paraspinal edema, not evident on current CT. It may be reasonable to consider MR correlation at this time, ideally pre and postcontrast, given the patient's symptoms and findings on prior MR examination from 2014. Electronically Signed   By: Lowella Grip III M.D.   On: 09/23/2015 13:54   Ct Cervical Spine Wo Contrast  09/23/2015  CLINICAL DATA:  Diffuse upper and lower extremity weakness. EXAM: CT HEAD WITHOUT CONTRAST CT CERVICAL SPINE WITHOUT CONTRAST TECHNIQUE: Multidetector CT imaging of the head and cervical spine was performed following the standard protocol without intravenous contrast. Multiplanar  CT image reconstructions of the cervical spine were also generated. COMPARISON:  Head CT May 17, 2013; cervical MRI September 17, 2012; CT cervical spine September 16, 2012 FINDINGS: CT HEAD FINDINGS The ventricles are normal in size and configuration. There is no intracranial mass, hemorrhage, extra-axial fluid collection, or midline shift. Gray-white compartments appear normal. No acute infarct evident. Bony calvarium appears intact. The mastoid air cells are clear. No intraorbital lesions are evident. CT CERVICAL SPINE FINDINGS There is no fracture or spondylolisthesis. Prevertebral soft tissues and predental space regions are normal. The disc spaces appear unremarkable. There is facet hypertrophy at several levels bilaterally. No nerve root edema or effacement is evident on this study. No disc extrusion or stenosis. IMPRESSION: CT head:  Study within normal limits. CT cervical spine: No fracture or spondylolisthesis. Relatively mild osteoarthritic changes noted at several levels. No nerve root edema or effacement is apparent. No disc extrusion or stenosis. Should be noted that assessment of the cervical cord is quite limited on noncontrast  enhanced cervical spine CT. On most recent thoracolumbar from 2014, the patient did have paraspinal edema, not evident on current CT. It may be reasonable to consider MR correlation at this time, ideally pre and postcontrast, given the patient's symptoms and findings on prior MR examination from 2014. Electronically Signed   By: Lowella Grip III M.D.   On: 09/23/2015 13:54   US Renal  09/25/2015  CLINICAL DATA:  Urine retention for 2 days. EXAM: RENAL / URINARY TRACT ULTRASOUND COMPLETE COMPARISON:  None. FINDINGS: Right Kidney: Length: 10.2 cm. Echogenicity within normal limits. No mass or hydronephrosis visualized. Left Kidney: Length: 11.8 cm. Echogenicity within normal limits. No mass or hydronephrosis visualized. Bladder: Appears normal for degree of bladder distention.  Urinary Foley is in place. IMPRESSION: Normal appearance of bilateral kidneys. Moderate distention of the urinary bladder with Foley catheter in place. If the catheter had not been intentionally occluded, this suggests in proper draining of the catheter. Electronically Signed   By: Fidela Salisbury M.D.   On: 09/25/2015 13:47   Dg Chest Port 1 View  09/24/2015  CLINICAL DATA:  PICC line placement EXAM: PORTABLE CHEST 1 VIEW COMPARISON:  Portable exam 0935 hours compared to 09/23/2015 FINDINGS: RIGHT arm PICC line with tip projecting at confluence of the SVC. Normal heart size, mediastinal contours, and pulmonary vascularity. Mild RIGHT basilar atelectasis. Lungs otherwise clear. No pleural effusion or pneumothorax. IMPRESSION: Tip of RIGHT arm PICC line projects over the confluence of the SVC. Subsegmental atelectasis RIGHT base. Electronically Signed   By: Lavonia Dana M.D.   On: 09/24/2015 09:56   Mm Screening Breast Tomo Bilateral  09/02/2015  CLINICAL DATA:  Screening. EXAM: 2D DIGITAL SCREENING BILATERAL MAMMOGRAM WITH CAD AND ADJUNCT TOMO COMPARISON:  Previous exam(s). ACR Breast Density Category b: There are scattered areas of fibroglandular density. FINDINGS: There are no findings suspicious for malignancy. Images were processed with CAD. IMPRESSION: No mammographic evidence of malignancy. A result letter of this screening mammogram will be mailed directly to the patient. RECOMMENDATION: Screening mammogram in one year. (Code:SM-B-01Y) BI-RADS CATEGORY  1: Negative. Electronically Signed   By: Everlean Alstrom M.D.   On: 09/02/2015 08:32    Microbiology: Recent Results (from the past 240 hour(s))  Blood culture (routine x 2)     Status: None (Preliminary result)   Collection Time: 09/23/15 12:36 PM  Result Value Ref Range Status   Specimen Description BLOOD LEFT ANTECUBITAL  Final   Special Requests   Final    BOTTLES DRAWN AEROBIC AND ANAEROBIC AEB=7CC ANA=5CC   Culture NO GROWTH 4 DAYS   Final   Report Status PENDING  Incomplete  Blood culture (routine x 2)     Status: None (Preliminary result)   Collection Time: 09/23/15 12:42 PM  Result Value Ref Range Status   Specimen Description BLOOD RIGHT FOREARM DRAWN BY RN  Final   Special Requests BOTTLES DRAWN AEROBIC AND ANAEROBIC 5CC EACH  Final   Culture NO GROWTH 4 DAYS  Final   Report Status PENDING  Incomplete  Urine culture     Status: None   Collection Time: 09/23/15  2:11 PM  Result Value Ref Range Status   Specimen Description URINE, CATHETERIZED  Final   Special Requests NONE  Final   Culture NO GROWTH Performed at [facility]   Final   Report Status 09/25/2015 FINAL  Final  MRSA PCR Screening     Status: None   Collection Time: 09/23/15  7:00 PM  Result Value Ref Range Status   MRSA by PCR NEGATIVE NEGATIVE Final    Comment:        The GeneXpert MRSA Assay (FDA approved for NASAL specimens only), is one component of a comprehensive MRSA colonization surveillance program. It is not intended to diagnose MRSA infection nor to guide or monitor treatment for MRSA infections.      Labs: Basic Metabolic Panel:  Recent Labs Lab 09/23/15 1236  09/24/15 0041 09/24/15 0700  09/25/15 0520 09/25/15 1525 09/26/15 0515 09/27/15 0549 09/28/15 0535  NA 140  < > 144 143  --  142  --  137 136 134*  K 1.4*  < > 1.4* 1.5*  < > 4.0 3.5 4.2 4.6 4.9  CL 113*  < > 123* 122*  --  119*  --  110 108 109  CO2 13*  < > 17* 17*  --  21*  --  24 24 20*  GLUCOSE 160*  < > 146* 153*  --  167*  --  123* 127* 195*  BUN 10  < > 9 7  --  <5*  --  5* 7 10  CREATININE 0.99  < > 0.94 1.01*  --  0.73  --  0.59 0.54 0.68  CALCIUM 10.4*  < > 7.7* 8.5*  --  7.6*  --  8.0* 8.4* 8.3*  MG 2.2  --  1.8 1.8  --   --   --   --   --   --   PHOS  --   --   --   --   --   --   --  2.3* 2.5 2.2*  < > = values in this interval not displayed. Liver Function Tests:  Recent Labs Lab 09/23/15 1242 09/26/15 0515 09/27/15 0549  09/28/15 0535  AST 22  --   --   --   ALT 13*  --   --   --   ALKPHOS 84  --   --   --   BILITOT 0.9  --   --   --   PROT 8.7*  --   --   --   ALBUMIN 4.7 2.7* 2.9* 3.2*   No results for input(s): LIPASE, AMYLASE in the last 168 hours. No results for input(s): AMMONIA in the last 168 hours. CBC:  Recent Labs Lab 09/23/15 1236 09/25/15 0520 09/26/15 0515 09/27/15 0549  WBC 28.4* 14.0* 12.1* 11.3*  NEUTROABS 23.6*  --   --   --   HGB 18.0* 11.4* 11.3* 12.1  HCT 49.3* 31.9* 32.3* 35.2*  MCV 88.2 89.6 91.0 92.4  PLT 443* 274 248 269   Cardiac Enzymes:  Recent Labs Lab 09/23/15 1236 09/23/15 2242 09/24/15 0700 09/25/15 0520 09/26/15 0515 09/28/15 0535  CKTOTAL 523* 331* 263* 1876* 2601* 441*  TROPONINI 0.08* 0.13* 0.08*  --   --   --    CBG:  Recent Labs Lab 09/23/15 1222  GLUCAP 165*    Signed: Kathie Dike, MD.  [facility] 09/28/2015, 12:45 PM

## 2015-09-28 NOTE — Progress Notes (Signed)
Subjective: Interval History: Continue to feel better. Still she has some weakness  Objective: Vital signs in last 24 hours: Temp:  [97.7 F (36.5 C)-98.3 F (36.8 C)] 97.7 F (36.5 C) (06/20 0541) Pulse Rate:  [66-97] 83 (06/20 0541) Resp:  [18] 18 (06/20 0541) BP: (109-129)/(71-81) 109/71 mmHg (06/20 0541) SpO2:  [97 %-98 %] 97 % (06/20 0541) Weight:  [62.3 kg (137 lb 5.6 oz)] 62.3 kg (137 lb 5.6 oz) (06/20 0541) Weight change: -5 kg (-11 lb 0.4 oz)  Intake/Output from previous day: 06/19 0701 - 06/20 0700 In: 1548.8 [P.O.:480; I.V.:1068.8] Out: 5100 [Urine:5100] Intake/Output this shift:    General appearance: alert, cooperative and no distress Resp: clear to auscultation bilaterally Cardio: regular rate and rhythm, S1, S2 normal, no murmur, click, rub or gallop GI: soft, non-tender; bowel sounds normal; no masses,  no organomegaly Extremities: extremities normal, atraumatic, no cyanosis or edema  Lab Results:  Recent Labs  09/26/15 0515 09/27/15 0549  WBC 12.1* 11.3*  HGB 11.3* 12.1  HCT 32.3* 35.2*  PLT 248 269   BMET:   Recent Labs  09/27/15 0549 09/28/15 0535  NA 136 134*  K 4.6 4.9  CL 108 109  CO2 24 20*  GLUCOSE 127* 195*  BUN 7 10  CREATININE 0.54 0.68  CALCIUM 8.4* 8.3*   No results for input(s): PTH in the last 72 hours. Iron Studies: No results for input(s): IRON, TIBC, TRANSFERRIN, FERRITIN in the last 72 hours.  Studies/Results: No results found.  I have reviewed the patient's current medications.  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 Patient off potassium supplement and on Amiloride 10 mg her potassium has corrected Problem #2 metabolic acidosis: Non-anion gap. Most likely related to the above. Patient of sodium bicarbonate and CO2 is better Problem #3 muscle weakness: Improving. Patient still states that she has some weakness but is not as bad as before. Problem #4 elevated CPK: Possibly  secondary to muscle injury associated to her hypokalemia. 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 Has improved Problem #6 possible history of obstructive uropathy. Folly catheter is out and making urine Problem #7 leukocytosis: Etiology is not clear but her white blood cell count is improving Problem #8 anemia Problem #9 hypothyroidism: On synthroid Plan: 1] Decrease Amiloride to 5 mg once a day 2] We will check her potasium in 3 to 4 weeks 3] Since her hypokalimia has resolved I will sign and follow as out patient in 4 weeks    LOS: 5 days   Sharon Atkins S 09/28/2015,8:17 AM

## 2015-09-28 NOTE — Progress Notes (Signed)
Gifford A. Merlene Laughter, MD     [email or web address]          Sharon Atkins is an 45 y.o. female.   Assessment/Plan: 1. 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 This has classically being seen in undiagnosed cases of thyroid disease particularly hyperthyroidism. In this situation however I suspect that the patient's thyroid disease is also the etiology. She is somewhat hyperthyroid likely due to replacement. There is no evidence of the diuretics or other medications at this time causing the severe hypo-kalemia. No evidence of nausea vomiting or other GI symptoms. No clear history of heavy alcohol use. Doubt primary aldosteronism. Overall, I think the patient's etiology is from thyroid disease.  2. 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    RECOMMENDATION: High dose steroids. Solu-Medrol 1 g 1 day and then weaning protocol afterwards.  Follow-up in 2 -4 weeks.  Arrange for muscle biopsy on discharge.     I had a very lengthy discussion with the nephrologist and the patient -over 40 minutes-. The case is rather complicated and somewhat confusion given the metabolic findings and the high CPK.  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 The patient has been placed on Amiloride for this. Potassium has normalized. CPK peaked at 2600 but has not trended down this morning. Patient thinks that her strength on the right side is still weak. Left-sided weakness has improved to baseline and essentially back to normal. She again is complaining of a lot of muscle pain although this may have improved with a dose of steroids she got last night.       GENERAL: Pleasant female who is not acutely distressed at this time. She does appear uncomfortable with the weakness and pain throughout however.  HEENT: Neck muscles with the neck flexion  4/5 in neck extension 4/5.  ABDOMEN: soft  EXTREMITIES: No edema   BACK: Normal.  SKIN: Normal by inspection.   MENTAL STATUS: Alert and oriented. Speech, language and cognition are generally intact. Judgment and insight normal.   CRANIAL NERVES: Pupils are equal, round and reactive to light and accomodation; extra ocular movements are full, there is no significant nystagmus; visual fields are full; upper and lower facial muscles are normal in strength and symmetric, there is no flattening of the nasolabial folds; tongue is midline; uvula is midline; shoulder elevation is normal.  MOTOR: Right deltoid 4/5; left deltoid 4+/5; right hip flexion 4/5 in left hip flexion 4+/5. Dorsiflexion also about 4/5 on the right and 4+ on the left.  [[[[[[[[[[[[[[PRIOR EXAM Deltoid 3/5 and hand movements approximately 3/5. Hip flexion 2/5 in dorsiflexion 0/5. Tone is reduced and bulk is normal.]]]]]]]]]]]]]]]]]]  COORDINATION: Left finger to nose is normal, right finger to nose is normal, No rest tremor; no intention tremor; no postural tremor; no bradykinesia.  REFLEXES: Deep tendon reflexes are symmetrical and normal.   SENSATION: Normal to light touch, temperature, and pinprick.    Objective: Vital signs in last 24 hours: Temp:  [97.7 F (36.5 C)-98.3 F (36.8 C)] 97.7 F (36.5 C) (06/20 0541) Pulse Rate:  [66-97] 83 (06/20 0541) Resp:  [18] 18 (06/20 0541) BP: (109-129)/(71-81) 109/71 mmHg (06/20 0541) SpO2:  [97 %-98 %] 97 % (06/20 0541) Weight:  [137 lb 5.6 oz (62.3 kg)] 137 lb 5.6 oz (62.3 kg) (06/20 0541)  Intake/Output from previous day: 06/19 0701 - 06/20 0700 In:  1548.8 [P.O.:480; I.V.:1068.8] Out: 5100 [Urine:5100] Intake/Output this shift:   Nutritional status: Diet Heart Room service appropriate?: Yes; Fluid consistency:: Thin   Lab Results: Results for orders placed or performed during the hospital encounter of 09/23/15 (from the past 48 hour(s))  Renal function panel      Status: Abnormal   Collection Time: 09/27/15  5:49 AM  Result Value Ref Range   Sodium 136 135 - 145 mmol/L   Potassium 4.6 3.5 - 5.1 mmol/L   Chloride 108 101 - 111 mmol/L   CO2 24 22 - 32 mmol/L   Glucose, Bld 127 (H) 65 - 99 mg/dL   BUN 7 6 - 20 mg/dL   Creatinine, Ser 0.54 0.44 - 1.00 mg/dL   Calcium 8.4 (L) 8.9 - 10.3 mg/dL   Phosphorus 2.5 2.5 - 4.6 mg/dL   Albumin 2.9 (L) 3.5 - 5.0 g/dL   GFR calc non Af Amer >60 >60 mL/min   GFR calc Af Amer >60 >60 mL/min    Comment: (NOTE) The eGFR has been calculated using the CKD EPI equation. This calculation has not been validated in all clinical situations. eGFR's persistently <60 mL/min signify possible Chronic Kidney Disease.    Anion gap 4 (L) 5 - 15  CBC     Status: Abnormal   Collection Time: 09/27/15  5:49 AM  Result Value Ref Range   WBC 11.3 (H) 4.0 - 10.5 K/uL   RBC 3.81 (L) 3.87 - 5.11 MIL/uL   Hemoglobin 12.1 12.0 - 15.0 g/dL   HCT 35.2 (L) 36.0 - 46.0 %   MCV 92.4 78.0 - 100.0 fL   MCH 31.8 26.0 - 34.0 pg   MCHC 34.4 30.0 - 36.0 g/dL   RDW 14.6 11.5 - 15.5 %   Platelets 269 150 - 400 K/uL  Urinalysis, Routine w reflex microscopic (not at [facility])     Status: Abnormal   Collection Time: 09/27/15  8:47 AM  Result Value Ref Range   Color, Urine STRAW (A) YELLOW   APPearance CLEAR CLEAR   Specific Gravity, Urine 1.010 1.005 - 1.030   pH 6.5 5.0 - 8.0   Glucose, UA NEGATIVE NEGATIVE mg/dL   Hgb urine dipstick TRACE (A) NEGATIVE   Bilirubin Urine NEGATIVE NEGATIVE   Ketones, ur NEGATIVE NEGATIVE mg/dL   Protein, ur NEGATIVE NEGATIVE mg/dL   Nitrite NEGATIVE NEGATIVE   Leukocytes, UA LARGE (A) NEGATIVE  Urine microscopic-add on     Status: Abnormal   Collection Time: 09/27/15  8:47 AM  Result Value Ref Range   Squamous Epithelial / LPF 0-5 (A) NONE SEEN   WBC, UA TOO NUMEROUS TO COUNT 0 - 5 WBC/hpf   RBC / HPF 0-5 0 - 5 RBC/hpf   Bacteria, UA MANY (A) NONE SEEN   Trichomonas, UA PRESENT   Renal function  panel     Status: Abnormal   Collection Time: 09/28/15  5:35 AM  Result Value Ref Range   Sodium 134 (L) 135 - 145 mmol/L   Potassium 4.9 3.5 - 5.1 mmol/L   Chloride 109 101 - 111 mmol/L   CO2 20 (L) 22 - 32 mmol/L   Glucose, Bld 195 (H) 65 - 99 mg/dL   BUN 10 6 - 20 mg/dL   Creatinine, Ser 0.68 0.44 - 1.00 mg/dL   Calcium 8.3 (L) 8.9 - 10.3 mg/dL   Phosphorus 2.2 (L) 2.5 - 4.6 mg/dL   Albumin 3.2 (L) 3.5 - 5.0 g/dL   GFR calc non  Af Amer >60 >60 mL/min   GFR calc Af Amer >60 >60 mL/min    Comment: (NOTE) The eGFR has been calculated using the CKD EPI equation. This calculation has not been validated in all clinical situations. eGFR's persistently <60 mL/min signify possible Chronic Kidney Disease.    Anion gap 5 5 - 15  CK     Status: Abnormal   Collection Time: 09/28/15  5:35 AM  Result Value Ref Range   Total CK 441 (H) 38 - 234 U/L    Lipid Panel No results for input(s): CHOL, TRIG, HDL, CHOLHDL, VLDL, LDLCALC in the last 72 hours.  Studies/Results:   Medications:  Scheduled Meds: . aMILoride  5 mg Oral Daily  . cyanocobalamin  1,000 mcg Intramuscular Daily  . enoxaparin (LOVENOX) injection  40 mg Subcutaneous Q24H  . folic acid  1 mg Oral Daily  . levothyroxine  112 mcg Oral QAC breakfast   Continuous Infusions:   PRN Meds:.sodium chloride, acetaminophen, HYDROcodone-acetaminophen, LORazepam, morphine injection, ondansetron (ZOFRAN) IV, zolpidem     LOS: 5 days   Jakyria Bleau A. Merlene Laughter, M.D.  Diplomate, Tax adviser of Psychiatry and Neurology ( Neurology).

## 2015-09-28 NOTE — Care Management Note (Signed)
Case Management Note  Patient Details  Name: Sharon Atkins MRN: [id number] Date of Birth: 1970-08-09   Expected Discharge Date:       09/28/2015           Expected Discharge Plan:     In-House Referral:     Discharge planning Services     Post Acute Care Choice:    Choice offered to:     DME Arranged:    DME Agency:     HH Arranged:    HH Agency:     Status of Service:     If discussed at [employer] of Stay Meetings, dates discussed:    Additional Comments: Patient is discharging today. PT has recommended outpatient PT. Patient would like to use [facility] outpatient Rehab. Order faxed. No other CM needs. Will sign off.   Karsen Nakanishi, Chauncey Reading, RN 09/28/2015, 12:46 PM

## 2015-10-04 ENCOUNTER — Encounter: Payer: Self-pay | Admitting: Nurse Practitioner

## 2015-10-04 ENCOUNTER — Other Ambulatory Visit (HOSPITAL_COMMUNITY)
Admission: RE | Admit: 2015-10-04 | Discharge: 2015-10-04 | Disposition: A | Discharge status: Home / Self Care | Payer: Medicaid Other | Source: Ambulatory Visit | Attending: Nurse Practitioner | Admitting: Nurse Practitioner

## 2015-10-04 ENCOUNTER — Ambulatory Visit (INDEPENDENT_AMBULATORY_CARE_PROVIDER_SITE_OTHER): Payer: Medicaid Other | Admitting: Nurse Practitioner

## 2015-10-04 VITALS — BP 132/88 | Ht 60.0 in | Wt 137.0 lb

## 2015-10-04 DIAGNOSIS — R768 Other specified abnormal immunological findings in serum: Secondary | ICD-10-CM

## 2015-10-04 DIAGNOSIS — N2589 Other disorders resulting from impaired renal tubular function: Secondary | ICD-10-CM | POA: Diagnosis present

## 2015-10-04 DIAGNOSIS — E876 Hypokalemia: Secondary | ICD-10-CM

## 2015-10-04 DIAGNOSIS — R531 Weakness: Secondary | ICD-10-CM | POA: Insufficient documentation

## 2015-10-04 DIAGNOSIS — R748 Abnormal levels of other serum enzymes: Secondary | ICD-10-CM

## 2015-10-04 LAB — HEPATIC FUNCTION PANEL
ALBUMIN: 4.2 g/dL (ref 3.5–5.0)
ALK PHOS: 86 U/L (ref 38–126)
ALT: 36 U/L (ref 14–54)
AST: 15 U/L (ref 15–41)
BILIRUBIN INDIRECT: 0.4 mg/dL (ref 0.3–0.9)
Bilirubin, Direct: 0.1 mg/dL (ref 0.1–0.5)
TOTAL PROTEIN: 7.6 g/dL (ref 6.5–8.1)
Total Bilirubin: 0.5 mg/dL (ref 0.3–1.2)

## 2015-10-04 LAB — CBC WITH DIFFERENTIAL/PLATELET
BASOS PCT: 0 %
Basophils Absolute: 0 10*3/uL (ref 0.0–0.1)
EOS ABS: 0.2 10*3/uL (ref 0.0–0.7)
EOS PCT: 1 %
HCT: 45.1 % (ref 36.0–46.0)
HEMOGLOBIN: 15.8 g/dL — AB (ref 12.0–15.0)
Lymphocytes Relative: 24 %
Lymphs Abs: 6 10*3/uL — ABNORMAL HIGH (ref 0.7–4.0)
MCH: 31.7 pg (ref 26.0–34.0)
MCHC: 35 g/dL (ref 30.0–36.0)
MCV: 90.6 fL (ref 78.0–100.0)
MONOS PCT: 6 %
Monocytes Absolute: 1.5 10*3/uL — ABNORMAL HIGH (ref 0.1–1.0)
NEUTROS PCT: 69 %
Neutro Abs: 17.4 10*3/uL — ABNORMAL HIGH (ref 1.7–7.7)
PLATELETS: 411 10*3/uL — AB (ref 150–400)
RBC: 4.98 MIL/uL (ref 3.87–5.11)
RDW: 13.8 % (ref 11.5–15.5)
WBC: 25.1 10*3/uL — AB (ref 4.0–10.5)

## 2015-10-04 LAB — BASIC METABOLIC PANEL
ANION GAP: 8 (ref 5–15)
BUN: 17 mg/dL (ref 6–20)
CHLORIDE: 105 mmol/L (ref 101–111)
CO2: 18 mmol/L — AB (ref 22–32)
CREATININE: 0.88 mg/dL (ref 0.44–1.00)
Calcium: 9.3 mg/dL (ref 8.9–10.3)
GFR calc Af Amer: >60 mL/min (ref >60)
GFR calc non Af Amer: >60 mL/min (ref >60)
Glucose, Bld: 154 mg/dL — ABNORMAL HIGH (ref 65–99)
POTASSIUM: 3.2 mmol/L — AB (ref 3.5–5.1)
SODIUM: 131 mmol/L — AB (ref 135–145)

## 2015-10-04 MED ORDER — HYDROCODONE-ACETAMINOPHEN 10-325 MG PO TABS: 1.0000 | ORAL_TABLET | Freq: Three times a day (TID) | ORAL | Status: DC | PRN

## 2015-10-04 MED ORDER — POTASSIUM CHLORIDE CRYS ER 20 MEQ PO TBCR: 40.0000 meq | EXTENDED_RELEASE_TABLET | Freq: Every day | ORAL | Status: DC

## 2015-10-04 NOTE — Progress Notes (Signed)
Subjective:  Presents for follow-up after recent hospitalization. Had multiple issues including a severe hypokalemia with a level of 1.4. Overall feeling better just very weak. Has started having increased muscle pain. No fever or chills. According to discharge notes she needs referrals to specialists.  Objective:   BP 132/88 mmHg  Ht 5' (1.524 m)  Wt 137 lb (62.143 kg)  BMI 26.76 kg/m2 NAD. Alert, oriented. Lungs clear. Heart regular rate rhythm. Nontoxic in appearance.  Assessment:  Problem List Items Addressed This Visit      Genitourinary   RTA (renal tubular acidosis)   Relevant Orders   CBC with Differential/Platelet   Hepatic function panel   Basic metabolic panel     Other   Weakness generalized   Relevant Orders   CBC with Differential/Platelet   Hepatic function panel   Basic metabolic panel    Other Visit Diagnoses    Hypokalemia    -  Primary    Relevant Orders    CBC with Differential/Platelet    Hepatic function panel    Basic metabolic panel      Plan:  Meds ordered this encounter  Medications  . HYDROcodone-acetaminophen (NORCO) 10-325 MG tablet    Sig: Take 1 tablet by mouth every 8 (eight) hours as needed.    Dispense:  30 tablet    Refill:  0    Order Specific Question:  Supervising Provider    Answer:  Mikey Kirschner [[id number]]  . potassium chloride SA (K-DUR,KLOR-CON) 20 MEQ tablet    Sig: Take 2 tablets (40 mEq total) by mouth daily.    Dispense:  60 tablet    Refill:  2    **Please remind patient to stop Amiloride**    Order Specific Question:  Supervising Provider    Answer:  Mikey Kirschner [[id number]]   Stat CBC liver function and met 7 were obtained. WBC count is back up to 25.1. Note patient is on prednisone. Potassium level continues to slowly drop since discharge. Is not currently on a potassium supplement, one was started today. Reviewed case and current labs with Dr. Nicki Reaper. Based on ANA and CK levels recommend urgent referral to  rheumatology. Hold on referral to neurology until after this has been done. Refer to nephrology in 3-4 weeks. This plan was reviewed with patient and she agrees. Stop amiloride since potassium supplement is being started today. Follow-up here as needed.

## 2015-10-05 ENCOUNTER — Telehealth: Payer: Self-pay | Admitting: *Deleted

## 2015-10-05 ENCOUNTER — Other Ambulatory Visit: Payer: Self-pay | Admitting: *Deleted

## 2015-10-05 DIAGNOSIS — N2589 Other disorders resulting from impaired renal tubular function: Secondary | ICD-10-CM

## 2015-10-05 DIAGNOSIS — R531 Weakness: Secondary | ICD-10-CM

## 2015-10-05 DIAGNOSIS — E876 Hypokalemia: Secondary | ICD-10-CM

## 2015-10-05 NOTE — Telephone Encounter (Signed)
[facility]. bw orders put in do to at [place] wanted pt to do bloodwork today but not stat.

## 2015-10-05 NOTE — Telephone Encounter (Signed)
Sharon Atkins wanted to order cbc, met 7, mg, ck since she cannot get into rheumatology this week. bw orders put in. Sharon Atkins wants pt to do bw today and schedule follow up ov with her on Thursday. [facility].

## 2015-10-05 NOTE — Telephone Encounter (Signed)
noted 

## 2015-10-05 NOTE — Telephone Encounter (Signed)
Spoke with patient and informed her per Linzie Collin- order for blood work was put in due to patient not being able to see rheumatology this week. Also informed patient that Hoyle Sauer would like to see patient on this Thursday. Patient verbalized understanding and stated she may not be able to get labs drawn until tomorrow 10/06/15 but she will get them drawn tomorrow morning. Patient transferred to front desk to schedule appointment tomorrow.

## 2015-10-06 ENCOUNTER — Encounter: Payer: Self-pay | Admitting: Family Medicine

## 2015-10-06 LAB — CBC WITH DIFFERENTIAL/PLATELET
BASOS: 0 %
Basophils Absolute: 0.1 10*3/uL (ref 0.0–0.2)
EOS (ABSOLUTE): 0.3 10*3/uL (ref 0.0–0.4)
EOS: 2 %
HEMATOCRIT: 44.8 % (ref 34.0–46.6)
HEMOGLOBIN: 15 g/dL (ref 11.1–15.9)
Immature Grans (Abs): 0 10*3/uL (ref 0.0–0.1)
Immature Granulocytes: 0 %
LYMPHS ABS: 2.6 10*3/uL (ref 0.7–3.1)
Lymphs: 16 %
MCH: 31.5 pg (ref 26.6–33.0)
MCHC: 33.5 g/dL (ref 31.5–35.7)
MCV: 94 fL (ref 79–97)
MONOCYTES: 3 %
MONOS ABS: 0.5 10*3/uL (ref 0.1–0.9)
Neutrophils Absolute: 12.7 10*3/uL — ABNORMAL HIGH (ref 1.4–7.0)
Neutrophils: 79 %
Platelets: 426 10*3/uL — ABNORMAL HIGH (ref 150–379)
RBC: 4.76 x10E6/uL (ref 3.77–5.28)
RDW: 15.3 % (ref 12.3–15.4)
WBC: 16.2 10*3/uL — AB (ref 3.4–10.8)

## 2015-10-06 LAB — BASIC METABOLIC PANEL
BUN/Creatinine Ratio: 14 (ref 9–23)
BUN: 13 mg/dL (ref 6–24)
CALCIUM: 9.5 mg/dL (ref 8.7–10.2)
CO2: 18 mmol/L (ref 18–29)
CREATININE: 0.91 mg/dL (ref 0.57–1.00)
Chloride: 100 mmol/L (ref 96–106)
GFR, EST AFRICAN AMERICAN: 88 mL/min/{1.73_m2} (ref >59)
GFR, EST NON AFRICAN AMERICAN: 76 mL/min/{1.73_m2} (ref >59)
Glucose: 138 mg/dL — ABNORMAL HIGH (ref 65–99)
Potassium: 4.8 mmol/L (ref 3.5–5.2)
SODIUM: 137 mmol/L (ref 134–144)

## 2015-10-06 LAB — CK: CK TOTAL: 17 U/L — AB (ref 24–173)

## 2015-10-06 LAB — MAGNESIUM: Magnesium: 1.9 mg/dL (ref 1.6–2.3)

## 2015-10-07 ENCOUNTER — Ambulatory Visit (INDEPENDENT_AMBULATORY_CARE_PROVIDER_SITE_OTHER): Payer: Medicaid Other | Admitting: Nurse Practitioner

## 2015-10-07 VITALS — BP 134/90 | Temp 97.6°F | Ht 60.0 in | Wt 143.0 lb

## 2015-10-07 DIAGNOSIS — R739 Hyperglycemia, unspecified: Secondary | ICD-10-CM | POA: Diagnosis not present

## 2015-10-07 DIAGNOSIS — E876 Hypokalemia: Secondary | ICD-10-CM

## 2015-10-07 DIAGNOSIS — R531 Weakness: Secondary | ICD-10-CM | POA: Diagnosis not present

## 2015-10-07 LAB — POCT GLYCOSYLATED HEMOGLOBIN (HGB A1C): HEMOGLOBIN A1C: 6.2

## 2015-10-07 MED ORDER — OXYCODONE-ACETAMINOPHEN 5-325 MG PO TABS: 1.0000 | ORAL_TABLET | Freq: Four times a day (QID) | ORAL | Status: DC | PRN

## 2015-10-08 ENCOUNTER — Encounter: Payer: Self-pay | Admitting: Nurse Practitioner

## 2015-10-08 LAB — CALCIUM, URINE, 24 HOUR
CALCIUM UR: 2.2 mg/dL
Calcium, 24 hour urine: 154 mg/d (ref 100–250)
Total Volume: 7000
Urine Total Volume-UCA24: 7000 mL

## 2015-10-08 NOTE — Progress Notes (Signed)
Subjective:  Presents for recheck. Had her lab work done yesterday. Patient continues to experience extreme fatigue and generalized pain. No specific areas of weakness more of a generalized problem. Complaints of extreme pain, minimal relief with hydrocodone. No fevers. Taking potassium daily.  Objective:   BP 134/90 mmHg  Temp(Src) 97.6 F (36.4 C) (Oral)  Ht 5' (1.524 m)  Wt 143 lb (64.864 kg)  BMI 27.93 kg/m2 NAD. Alert, oriented. Anxious affect. Lungs clear. Heart regular rate rhythm. Labs from 10/05/2015 indicate significant improvement from previous labs. Results for orders placed or performed in visit on 10/07/15  POCT glycosylated hemoglobin (Hb A1C)  Result Value Ref Range   Hemoglobin A1C 6.2    A1c may be skewed due to medications given such as steroids during hospitalization.  Assessment:  Problem List Items Addressed This Visit      Other   Weakness generalized   Relevant Orders   POCT glycosylated hemoglobin (Hb A1C) (Completed)    Other Visit Diagnoses    Hypokalemia    -  Primary    Relevant Orders    POCT glycosylated hemoglobin (Hb A1C) (Completed)    Hyperglycemia        Relevant Orders    POCT glycosylated hemoglobin (Hb A1C) (Completed)      Plan:  Meds ordered this encounter  Medications  . oxyCODONE-acetaminophen (PERCOCET/ROXICET) 5-325 MG tablet    Sig: Take 1 tablet by mouth every 6 (six) hours as needed for severe pain.    Dispense:  30 tablet    Refill:  0    Order Specific Question:  Supervising Provider    Answer:  Mikey Kirschner [[id number]]   Repeat A1c in 3 months. Continue potassium as directed. Our referral for nadir was unable to get her an appointment with local rheumatologists in [place] because they do not accept her insurance. Is waiting to hear from another office. In the meantime appointment was made at [facility], earliest appointment was November. Patient was very upset about this but understood. Advised patient to go to [facility] ED  if she experiences worsening symptoms in the meantime. Warning signs were reviewed. Patient requesting stronger pain medication. Given a one-time prescription for Percocet. Reviewed addiction potential. To use very sparingly. Advised patient that we will not be prescribing long-term opiate use for her problem at this time. Do not take with hydrocodone. We will wait until mid week next week to see if another provider can see her in the near future. Based on this information will decide further follow-up. Callback in the meantime if any problems. 40 minutes was spent with this patient.

## 2015-10-14 ENCOUNTER — Other Ambulatory Visit: Payer: Self-pay | Admitting: Nurse Practitioner

## 2015-10-18 ENCOUNTER — Telehealth: Payer: Self-pay | Admitting: Nurse Practitioner

## 2015-10-18 NOTE — Telephone Encounter (Signed)
Patient states she heard from kidney dr and has an appt the end of the month but has not heard from rheumatologists about appt yet

## 2015-10-18 NOTE — Telephone Encounter (Signed)
[place], please check on status of this. Thanks! Hoyle Sauer

## 2015-10-18 NOTE — Telephone Encounter (Signed)
Please call patient and find out if she has heard from specialist about appointment. If not, I would like to repeat labs this week.

## 2015-10-21 NOTE — Telephone Encounter (Signed)
Per Dr. Arlean Hopping office, doc declined to take patient after reviewing the records

## 2015-10-25 ENCOUNTER — Telehealth: Payer: Self-pay | Admitting: Family Medicine

## 2015-10-25 DIAGNOSIS — R5383 Other fatigue: Secondary | ICD-10-CM

## 2015-10-25 DIAGNOSIS — E039 Hypothyroidism, unspecified: Secondary | ICD-10-CM

## 2015-10-25 DIAGNOSIS — Z79899 Other long term (current) drug therapy: Secondary | ICD-10-CM

## 2015-10-25 DIAGNOSIS — M791 Myalgia, unspecified site: Secondary | ICD-10-CM

## 2015-10-25 NOTE — Telephone Encounter (Signed)
And TSH

## 2015-10-25 NOTE — Telephone Encounter (Signed)
Met 7 magnesium

## 2015-10-25 NOTE — Telephone Encounter (Signed)
Notified patient bloodwork has been ordered.  

## 2015-10-25 NOTE — Telephone Encounter (Signed)
Patient is requesting labs to recheck her potassium due to her recent hospital stay.

## 2015-10-27 LAB — BASIC METABOLIC PANEL
BUN / CREAT RATIO: 7 — AB (ref 9–23)
BUN: 6 mg/dL (ref 6–24)
CO2: 21 mmol/L (ref 18–29)
CREATININE: 0.84 mg/dL (ref 0.57–1.00)
Calcium: 10.1 mg/dL (ref 8.7–10.2)
Chloride: 97 mmol/L (ref 96–106)
GFR, EST AFRICAN AMERICAN: 96 mL/min/{1.73_m2} (ref >59)
GFR, EST NON AFRICAN AMERICAN: 84 mL/min/{1.73_m2} (ref >59)
Glucose: 189 mg/dL — ABNORMAL HIGH (ref 65–99)
Potassium: 4.3 mmol/L (ref 3.5–5.2)
Sodium: 136 mmol/L (ref 134–144)

## 2015-10-27 LAB — TSH: TSH: 14.8 u[IU]/mL — ABNORMAL HIGH (ref 0.450–4.500)

## 2015-10-27 LAB — MAGNESIUM: MAGNESIUM: 2 mg/dL (ref 1.6–2.3)

## 2015-10-28 ENCOUNTER — Other Ambulatory Visit: Payer: Self-pay

## 2015-10-28 DIAGNOSIS — M791 Myalgia, unspecified site: Secondary | ICD-10-CM

## 2015-11-01 NOTE — Telephone Encounter (Signed)
Nurses contacted patient who was feeling better. Referral to be made to Dr. Merlene Laughter.

## 2015-11-03 ENCOUNTER — Encounter: Payer: Self-pay | Admitting: Family Medicine

## 2015-11-09 ENCOUNTER — Encounter: Payer: Self-pay | Admitting: Nurse Practitioner

## 2015-11-09 ENCOUNTER — Ambulatory Visit (INDEPENDENT_AMBULATORY_CARE_PROVIDER_SITE_OTHER): Payer: Medicaid Other | Admitting: Nurse Practitioner

## 2015-11-09 VITALS — BP 142/92 | Ht 60.0 in | Wt 140.4 lb

## 2015-11-09 DIAGNOSIS — M797 Fibromyalgia: Secondary | ICD-10-CM | POA: Diagnosis not present

## 2015-11-09 DIAGNOSIS — E039 Hypothyroidism, unspecified: Secondary | ICD-10-CM

## 2015-11-09 DIAGNOSIS — R350 Frequency of micturition: Secondary | ICD-10-CM | POA: Diagnosis not present

## 2015-11-09 DIAGNOSIS — R7303 Prediabetes: Secondary | ICD-10-CM | POA: Diagnosis not present

## 2015-11-09 LAB — POCT URINALYSIS DIPSTICK: pH, UA: 7

## 2015-11-09 MED ORDER — LEVOTHYROXINE SODIUM 112 MCG PO TABS: 112.0000 ug | ORAL_TABLET | Freq: Every day | ORAL | 2 refills | Status: DC

## 2015-11-09 MED ORDER — METFORMIN HCL 500 MG PO TABS: 500.0000 mg | ORAL_TABLET | Freq: Two times a day (BID) | ORAL | 2 refills | Status: DC

## 2015-11-09 MED ORDER — OXYCODONE-ACETAMINOPHEN 5-325 MG PO TABS: 1.0000 | ORAL_TABLET | Freq: Four times a day (QID) | ORAL | 0 refills | Status: DC | PRN

## 2015-11-11 ENCOUNTER — Encounter: Payer: Self-pay | Admitting: Nurse Practitioner

## 2015-11-11 NOTE — Progress Notes (Signed)
Subjective:  Presents to discuss her recent blood work. Continues to have significant muscle pain. Uses occasional pain medicine for extreme pain. Tries to limit her use of this. Has seen a nephrologist, no further workup is needed. Has an appointment with rheumatology this fall which was the earliest appointment we could get for her. A referral is in the works for Dr. Merlene Laughter. Has had some urinary frequency. No fever or dysuria. No pelvic pain. Began a few days ago.  Objective:   BP (!) 142/92   Ht 5' (1.524 m)   Wt 140 lb 6 oz (63.7 kg)   BMI 27.42 kg/m  NAD. Alert, oriented. Lungs clear. Heart regular rate rhythm. No CVA or flank tenderness. Abdomen soft nondistended nontender. Results for orders placed or performed in visit on 11/09/15  POCT urinalysis dipstick  Result Value Ref Range   Color, UA     Clarity, UA     Glucose, UA     Bilirubin, UA     Ketones, UA     Spec Grav, UA <=1.005    Blood, UA     pH, UA 7.0    Protein, UA     Urobilinogen, UA     Nitrite, UA     Leukocytes, UA  Negative    Reviewed labs with patient from 10/26/2015. Last hemoglobin A1c was done on 6/29 was 6.2.  Assessment:  Problem List Items Addressed This Visit      Endocrine   Hypothyroidism   Relevant Medications   levothyroxine (SYNTHROID, LEVOTHROID) 112 MCG tablet     Musculoskeletal and Integument   Fibromyalgia     Other   Prediabetes    Other Visit Diagnoses    Urinary frequency    -  Primary   Relevant Orders   POCT urinalysis dipstick (Completed)      Plan:  Meds ordered this encounter  Medications  . SODIUM BICARBONATE PO    Sig: Take by mouth.  . oxyCODONE-acetaminophen (PERCOCET/ROXICET) 5-325 MG tablet    Sig: Take 1 tablet by mouth every 6 (six) hours as needed for severe pain.    Dispense:  30 tablet    Refill:  0    Order Specific Question:   Supervising Provider    Answer:   Mikey Kirschner [[id number]]  . levothyroxine (SYNTHROID, LEVOTHROID) 112 MCG tablet   Sig: Take 1 tablet (112 mcg total) by mouth daily.    Dispense:  30 tablet    Refill:  2    Order Specific Question:   Supervising Provider    Answer:   Mikey Kirschner [[id number]]  . metFORMIN (GLUCOPHAGE) 500 MG tablet    Sig: Take 1 tablet (500 mg total) by mouth 2 (two) times daily with a meal.    Dispense:  60 tablet    Refill:  2    Order Specific Question:   Supervising Provider    Answer:   Mikey Kirschner [[id number]]   Limit use of Percocet. If patient continues regular use of this, will need to initiate pain management protocol. Go back to previous dose of levothyroxine due to elevated TSH. Start metformin 500 mg with supper for a couple of weeks, if tolerated increase to twice a day with meals. DC med and call if any problems. Return in about 3 months (around 02/09/2016) for recheck and labs.

## 2015-11-30 ENCOUNTER — Telehealth: Payer: Self-pay | Admitting: Nurse Practitioner

## 2015-11-30 DIAGNOSIS — M797 Fibromyalgia: Secondary | ICD-10-CM

## 2015-11-30 NOTE — Telephone Encounter (Signed)
Patient has an appointment with Dr. Merlene Laughter on Tuesday 12/07/15.  She wants to know if we can order blood work to have her potassium and muscle enzymes checked.

## 2015-11-30 NOTE — Telephone Encounter (Signed)
Normally prefer the specialist to order the b wq but lets do cpk and met 7

## 2015-11-30 NOTE — Telephone Encounter (Signed)
Spoke with patient and informed her per Dr.Steve Luking- Normally prefer the specialist to order the blood work but lets do cpk and met 7. Patient verbalized understanding. Labs ordered

## 2015-12-01 LAB — BASIC METABOLIC PANEL
BUN / CREAT RATIO: 10 (ref 9–23)
BUN: 9 mg/dL (ref 6–24)
CO2: 20 mmol/L (ref 18–29)
CREATININE: 0.92 mg/dL (ref 0.57–1.00)
Calcium: 10.4 mg/dL — ABNORMAL HIGH (ref 8.7–10.2)
Chloride: 101 mmol/L (ref 96–106)
GFR, EST AFRICAN AMERICAN: 86 mL/min/{1.73_m2} (ref >59)
GFR, EST NON AFRICAN AMERICAN: 75 mL/min/{1.73_m2} (ref >59)
GLUCOSE: 107 mg/dL — AB (ref 65–99)
Potassium: 4 mmol/L (ref 3.5–5.2)
SODIUM: 140 mmol/L (ref 134–144)

## 2015-12-01 LAB — CK: Total CK: 52 U/L (ref 24–173)

## 2015-12-02 ENCOUNTER — Encounter: Payer: Self-pay | Admitting: Nurse Practitioner

## 2015-12-02 ENCOUNTER — Other Ambulatory Visit: Payer: Self-pay | Admitting: Nurse Practitioner

## 2015-12-02 MED ORDER — OXYCODONE-ACETAMINOPHEN 5-325 MG PO TABS: 1.0000 | ORAL_TABLET | Freq: Four times a day (QID) | ORAL | 0 refills | Status: DC | PRN

## 2015-12-03 ENCOUNTER — Telehealth: Payer: Self-pay | Admitting: Nurse Practitioner

## 2015-12-03 NOTE — Telephone Encounter (Signed)
Sharon Atkins. Sharon Atkins would like a refill of the following medications:  oxyCODONE-acetaminophen (PERCOCET/ROXICET) 5-325 MG tablet [Hoskins, Katharine Look, NP]    Preferred pharmacy: [facility] [place]:

## 2015-12-03 NOTE — Telephone Encounter (Signed)
Called and informed patient per Linzie Collin- prescription is ready for pick up at our front desk. Patient verbalized understanding.

## 2015-12-03 NOTE — Telephone Encounter (Signed)
That was done yesterday. Should be upfront.

## 2015-12-31 ENCOUNTER — Telehealth: Payer: Self-pay | Admitting: *Deleted

## 2015-12-31 NOTE — Telephone Encounter (Signed)
Prior authorization request for OXYCODONE/APAP 5/325mg  was APPROVED. Approval # [id number].  Approval good 12/25/15-06/22/16. Pharmacy notified.

## 2016-01-03 ENCOUNTER — Encounter: Payer: Self-pay | Admitting: Nurse Practitioner

## 2016-01-03 ENCOUNTER — Ambulatory Visit (INDEPENDENT_AMBULATORY_CARE_PROVIDER_SITE_OTHER): Payer: Medicaid Other | Admitting: Nurse Practitioner

## 2016-01-03 VITALS — BP 140/96 | Temp 98.5°F | Ht 60.0 in | Wt 137.0 lb

## 2016-01-03 DIAGNOSIS — M797 Fibromyalgia: Secondary | ICD-10-CM | POA: Diagnosis not present

## 2016-01-03 DIAGNOSIS — Z79891 Long term (current) use of opiate analgesic: Secondary | ICD-10-CM

## 2016-01-03 MED ORDER — OXYCODONE-ACETAMINOPHEN 5-325 MG PO TABS: 1.0000 | ORAL_TABLET | Freq: Four times a day (QID) | ORAL | 0 refills | Status: DC | PRN

## 2016-01-03 MED ORDER — TIZANIDINE HCL 4 MG PO CAPS
4.0000 mg | ORAL_CAPSULE | Freq: Three times a day (TID) | ORAL | 2 refills | Status: DC | PRN
Start: 2016-01-03 — End: 2016-03-31

## 2016-01-03 NOTE — Progress Notes (Signed)
Subjective: This patient was seen today for chronic pain  The medication list was reviewed and updated.   -Compliance with medication: yes  - Number patient states they take daily: 0-3  -when was the last dose patient took? This am  The patient was advised the importance of maintaining medication and not using illegal substances with these.  Refills needed: yes  The patient was educated that we can provide 3 monthly scripts for their medication, it is their responsibility to follow the instructions.  Side effects or complications from medications: none  Patient is aware that pain medications are meant to minimize the severity of the pain to allow their pain levels to improve to allow for better function. They are aware of that pain medications cannot totally remove their pain.  Due for UDT ( at least once per year) : September 23, 2015  Patient has constant pain, worse over the past 4 days. Has appointment with rheumatology on 11/14 which is the earliest she could get. Calls several times a week to see if they have a cancellation. Pain on average 6+/10 at least. Sees Dr. Merlene Laughter. Had some tests done; told she does have muscle weakness and nerve damage but no explanation. Increased pain in both legs, right greater than left for the past 4 days. No change in bowel or bladder habits. No muscle relaxants at this time but does take ibuprofen 800 mg TID.  Objective: NAD. Alert, oriented. Lungs clear. Heart RRR. Generalized tenderness in the lumbar area. SLR produces tenderness in both legs. Gait slow but steady. Is able to get out of chair without using hands as support.   Assessment:  Problem List Items Addressed This Visit      Musculoskeletal and Integument   Fibromyalgia - Primary    Other Visit Diagnoses    Encounter for long-term opiate analgesic use         Plan:  Meds ordered this encounter  Medications  . DISCONTD: oxyCODONE-acetaminophen (PERCOCET/ROXICET) 5-325 MG tablet   Sig: Take 1 tablet by mouth every 6 (six) hours as needed for severe pain.    Dispense:  30 tablet    Refill:  0    Order Specific Question:   Supervising Provider    Answer:   Mikey Kirschner [[id number]]  . DISCONTD: oxyCODONE-acetaminophen (PERCOCET/ROXICET) 5-325 MG tablet    Sig: Take 1 tablet by mouth every 6 (six) hours as needed for severe pain.    Dispense:  30 tablet    Refill:  0    May fill 30 days from 01/03/16    Order Specific Question:   Supervising Provider    Answer:   Mikey Kirschner [[id number]]  . oxyCODONE-acetaminophen (PERCOCET/ROXICET) 5-325 MG tablet    Sig: Take 1 tablet by mouth every 6 (six) hours as needed for severe pain.    Dispense:  30 tablet    Refill:  0    May fill 60 days from 01/03/16    Order Specific Question:   Supervising Provider    Answer:   Mikey Kirschner [[id number]]  . tiZANidine (ZANAFLEX) 4 MG capsule    Sig: Take 1 capsule (4 mg total) by mouth 3 (three) times daily as needed for muscle spasms.    Dispense:  30 capsule    Refill:  2    Order Specific Question:   Supervising Provider    Answer:   Maggie Font   Explained that we will be limiting Percocet to 30 per  month. Discussed addiction potential. Follow up with rheumatology as planned. Trial of Zanaflex. Return in about 3 months (around 04/03/2016) for recheck. Call back sooner if needed.

## 2016-01-04 ENCOUNTER — Encounter: Payer: Self-pay | Admitting: Nurse Practitioner

## 2016-03-31 ENCOUNTER — Encounter: Payer: Self-pay | Admitting: Nurse Practitioner

## 2016-03-31 ENCOUNTER — Ambulatory Visit (INDEPENDENT_AMBULATORY_CARE_PROVIDER_SITE_OTHER): Payer: Medicaid Other | Admitting: Nurse Practitioner

## 2016-03-31 VITALS — BP 124/84 | Ht 60.0 in | Wt 140.5 lb

## 2016-03-31 DIAGNOSIS — F32A Depression, unspecified: Secondary | ICD-10-CM

## 2016-03-31 DIAGNOSIS — Z79891 Long term (current) use of opiate analgesic: Secondary | ICD-10-CM | POA: Insufficient documentation

## 2016-03-31 DIAGNOSIS — F329 Major depressive disorder, single episode, unspecified: Secondary | ICD-10-CM

## 2016-03-31 MED ORDER — DULOXETINE HCL 30 MG PO CPEP: 30.0000 mg | ORAL_CAPSULE | Freq: Every day | ORAL | 2 refills | Status: DC

## 2016-03-31 MED ORDER — OXYCODONE-ACETAMINOPHEN 5-325 MG PO TABS: 1.0000 | ORAL_TABLET | Freq: Four times a day (QID) | ORAL | 0 refills | Status: DC | PRN

## 2016-03-31 MED ORDER — TIZANIDINE HCL 4 MG PO CAPS: 4.0000 mg | ORAL_CAPSULE | Freq: Three times a day (TID) | ORAL | 2 refills | Status: DC | PRN

## 2016-03-31 NOTE — Progress Notes (Signed)
This patient was seen today for chronic pain  The medication list was reviewed and updated.   -Compliance with medication: yes  - Number patient states they take daily: 0-2  -when was the last dose patient took? 1-2 days ago  The patient was advised the importance of maintaining medication and not using illegal substances with these.  Refills needed: yes  The patient was educated that we can provide 3 monthly scripts for their medication, it is their responsibility to follow the instructions.  Side effects or complications from medications: none  Patient is aware that pain medications are meant to minimize the severity of the pain to allow their pain levels to improve to allow for better function. They are aware of that pain medications cannot totally remove their pain.  Due for UDT ( at least once per year) : today  [place] reviewed  Pain on average 6/10, after pain medication 4/10. Gets significant relief with Zanaflex although this does cause mild drowsiness. Is able to perform her regular ADLs. Would like medication to help with depression and anxiety. Has been seen by rheumatology at [facility] but no specific answer regarding her continued pain and issues. Her nephrologist is setting up an appointment with hematology.  Objective: NAD. Alert, oriented. Mildly anxious affect. Lungs clear. Heart regular rate rhythm. Thoughts logical coherent and relevant. Making good eye contact.  Assessment:  Problem List Items Addressed This Visit      Other   Depression   Relevant Medications   DULoxetine (CYMBALTA) 30 MG capsule   Encounter for long-term opiate analgesic use - Primary   Relevant Orders   ToxASSURE Select 13 (MW), Urine     Plan:  Meds ordered this encounter  Medications  . tiZANidine (ZANAFLEX) 4 MG capsule    Sig: Take 1 capsule (4 mg total) by mouth 3 (three) times daily as needed for muscle spasms.    Dispense:  60 capsule    Refill:  2    Order Specific Question:    Supervising Alera Quevedo    Answer:   Mikey Kirschner [[id number]]  . DISCONTD: oxyCODONE-acetaminophen (PERCOCET/ROXICET) 5-325 MG tablet    Sig: Take 1 tablet by mouth every 6 (six) hours as needed for severe pain.    Dispense:  30 tablet    Refill:  0    May fill 60 days from 03/31/16    Order Specific Question:   Supervising Jurrell Royster    Answer:   Mikey Kirschner [[id number]]  . DISCONTD: oxyCODONE-acetaminophen (PERCOCET/ROXICET) 5-325 MG tablet    Sig: Take 1 tablet by mouth every 6 (six) hours as needed for severe pain.    Dispense:  30 tablet    Refill:  0    May fill 30 days from 03/31/16    Order Specific Question:   Supervising Shiana Rappleye    Answer:   Mikey Kirschner [[id number]]  . oxyCODONE-acetaminophen (PERCOCET/ROXICET) 5-325 MG tablet    Sig: Take 1 tablet by mouth every 6 (six) hours as needed for severe pain.    Dispense:  30 tablet    Refill:  0    Order Specific Question:   Supervising Willow Reczek    Answer:   Mikey Kirschner [[id number]]  . DULoxetine (CYMBALTA) 30 MG capsule    Sig: Take 1 capsule (30 mg total) by mouth daily.    Dispense:  30 capsule    Refill:  2    Order Specific Question:   Supervising Akya Fiorello    Answer:  Mikey Kirschner [[id number]]   Add Cymbalta 30 mg daily to regimen. DC med and call if any significant side effects. Continue follow-up with her specialist including nephrology as planned. Return in about 3 months (around 06/29/2016) for recheck pain management.

## 2016-04-07 ENCOUNTER — Other Ambulatory Visit: Payer: Self-pay | Admitting: *Deleted

## 2016-04-07 DIAGNOSIS — D45 Polycythemia vera: Secondary | ICD-10-CM

## 2016-04-11 LAB — TOXASSURE SELECT 13 (MW), URINE

## 2016-06-22 ENCOUNTER — Telehealth: Payer: Self-pay

## 2016-06-22 ENCOUNTER — Ambulatory Visit (INDEPENDENT_AMBULATORY_CARE_PROVIDER_SITE_OTHER): Payer: Medicaid Other | Admitting: Nurse Practitioner

## 2016-06-22 ENCOUNTER — Encounter: Payer: Self-pay | Admitting: Nurse Practitioner

## 2016-06-22 VITALS — BP 140/90 | Ht 60.0 in | Wt 139.4 lb

## 2016-06-22 DIAGNOSIS — R531 Weakness: Secondary | ICD-10-CM | POA: Diagnosis not present

## 2016-06-22 DIAGNOSIS — E559 Vitamin D deficiency, unspecified: Secondary | ICD-10-CM

## 2016-06-22 DIAGNOSIS — Z79891 Long term (current) use of opiate analgesic: Secondary | ICD-10-CM | POA: Diagnosis not present

## 2016-06-22 DIAGNOSIS — M797 Fibromyalgia: Secondary | ICD-10-CM

## 2016-06-22 DIAGNOSIS — E039 Hypothyroidism, unspecified: Secondary | ICD-10-CM | POA: Diagnosis not present

## 2016-06-22 MED ORDER — VITAMIN D (ERGOCALCIFEROL) 1.25 MG (50000 UNIT) PO CAPS: 50000.0000 [IU] | ORAL_CAPSULE | ORAL | 2 refills | Status: DC

## 2016-06-22 MED ORDER — DULOXETINE HCL 60 MG PO CPEP: 60.0000 mg | ORAL_CAPSULE | Freq: Every day | ORAL | 5 refills | Status: DC

## 2016-06-22 MED ORDER — OXYCODONE-ACETAMINOPHEN 5-325 MG PO TABS: 1.0000 | ORAL_TABLET | Freq: Four times a day (QID) | ORAL | 0 refills | Status: DC | PRN

## 2016-06-22 MED ORDER — TIZANIDINE HCL 4 MG PO CAPS
4.0000 mg | ORAL_CAPSULE | Freq: Three times a day (TID) | ORAL | 2 refills | Status: DC | PRN
Start: 2016-06-22 — End: 2019-10-28

## 2016-06-22 NOTE — Progress Notes (Addendum)
Subjective: This patient was seen today for chronic pain  The medication list was reviewed and updated.   -Compliance with medication: yes  - Number patient states they take daily: 0-2  -when was the last dose patient took? Several days; ran out Saturday  The patient was advised the importance of maintaining medication and not using illegal substances with these.  Refills needed: yes  The patient was educated that we can provide 3 monthly scripts for their medication, it is their responsibility to follow the instructions.  Side effects or complications from medications: none  Patient is aware that pain medications are meant to minimize the severity of the pain to allow their pain levels to improve to allow for better function. They are aware of that pain medications cannot totally remove their pain.  Due for UDT ( at least once per year) : UTD but repeat today.  Urine drug screen as last visit positive for Suboxone and neg for Oxycodone. Patient denies taking other meds. Has no explanation for screen results. Did take a "natural powder" her brother ordered on the internet.  Still sees local neurologist and nephrologist. Would like to see different neurologist for second opinion.  Her Cymbalta has increased to 60 mg. Needs refill. Has been out of med for a few days. Pain on average 8/10 on worst days; 5/10 after pain med. Needs eye exam. Wears glasses.   [place] CSR reviewed.   Objective: NAD. Alert, oriented. Lungs clear. Heart RRR.  Vitamin D level 10.1 on 03/22/16 per nephrologist scanned labs. According to patient, this has not been addressed.   Assessment:  Problem List Items Addressed This Visit      Endocrine   Hypothyroidism   Relevant Orders   TSH     Other   Encounter for long-term opiate analgesic use - Primary   Vitamin D deficiency     Plan: after patient left office, NP was notified by Andrez Grime, LPN that urine sample given was too cold, not even close to  body temp. She heard water running in the bathroom while patient was giving sample. Patient was given one Rx for pain meds before NP was aware of this. Because of unusual events regarding urine testing, consulted with Dr. Richardson Landry. We agree that our office will no longer prescribe controlled substances; note sent for nurse to notify. We will also send a letter. We will provide other primary care if she wishes and refer to pain management if she wants. Meds ordered this encounter  Medications  . DISCONTD: DULoxetine (CYMBALTA) 60 MG capsule    Sig: Take 60 mg by mouth daily.  . Vitamin D, Ergocalciferol, (DRISDOL) 50000 units CAPS capsule    Sig: Take 1 capsule (50,000 Units total) by mouth every 7 (seven) days.    Dispense:  4 capsule    Refill:  2    Order Specific Question:   Supervising Provider    Answer:   Mikey Kirschner [[id number]]  . tiZANidine (ZANAFLEX) 4 MG capsule    Sig: Take 1 capsule (4 mg total) by mouth 3 (three) times daily as needed for muscle spasms.    Dispense:  60 capsule    Refill:  2    Order Specific Question:   Supervising Provider    Answer:   Mikey Kirschner [[id number]]  . DULoxetine (CYMBALTA) 60 MG capsule    Sig: Take 1 capsule (60 mg total) by mouth daily.    Dispense:  30 capsule  Refill:  5    Order Specific Question:   Supervising Provider    Answer:   Mikey Kirschner [[id number]]  . oxyCODONE-acetaminophen (PERCOCET/ROXICET) 5-325 MG tablet    Sig: Take 1 tablet by mouth every 6 (six) hours as needed for severe pain.    Dispense:  30 tablet    Refill:  0    Order Specific Question:   Supervising Provider    Answer:   Maggie Font   Recheck here as needed.

## 2016-06-22 NOTE — Telephone Encounter (Signed)
Patient's drug urine sample was cold and the temperature strip did not register a temp at all.

## 2016-06-22 NOTE — Telephone Encounter (Signed)
Based on the unusual findings on her last urine and her urine today being cold, not at body temperature, per protocol I spoke with Dr. Richardson Landry. He and I agree that no further controlled substances will be prescribed here. If she would like a referral to pain clinic, we can try. We have to send records for review and then they let us know. She can still receive her medical care here otherwise if she wants.   Please flag the chart about controlled substances. Do we do this on med list?

## 2016-06-23 ENCOUNTER — Ambulatory Visit: Payer: Medicaid Other | Admitting: Nurse Practitioner

## 2016-06-23 ENCOUNTER — Telehealth: Payer: Self-pay | Admitting: Nurse Practitioner

## 2016-06-23 ENCOUNTER — Encounter: Payer: Self-pay | Admitting: Family Medicine

## 2016-06-23 NOTE — Telephone Encounter (Signed)
Patient she just wanted to make sure we would see her for her thyroid issue. Advised patient that Hoyle Sauer stated she will see her for her other issues but will no longer be prescribing controlled substances. Patient verbalized understanding.

## 2016-06-23 NOTE — Addendum Note (Signed)
Addended by: Nilda Simmer on: 06/23/2016 08:47 AM   Modules accepted: Orders

## 2016-06-23 NOTE — Telephone Encounter (Signed)
Patient advised  Per Hoyle Sauer based on the unusual findings on her last urine and her urine today being cold, not at body temperature, per protocol she spoke with Dr. Richardson Landry. He and Hoyle Sauer agree that no further controlled substances will be prescribed here. If she would like a referral to pain clinic, we can try. We have to send records for review and then they let us know. She can still receive her medical care here otherwise if she wants. Patient verbalized understanding and stated she does not want to go to pain management at this time

## 2016-06-23 NOTE — Telephone Encounter (Signed)
Patient would like to speak to a nurse regarding her discussion this morning.

## 2016-07-01 LAB — TSH: TSH: 38.62 u[IU]/mL — ABNORMAL HIGH (ref 0.450–4.500)

## 2016-08-14 ENCOUNTER — Institutional Professional Consult (permissible substitution): Payer: Medicaid Other | Admitting: Diagnostic Neuroimaging

## 2016-09-22 ENCOUNTER — Ambulatory Visit: Payer: Medicaid Other | Admitting: Nurse Practitioner

## 2016-10-24 ENCOUNTER — Other Ambulatory Visit: Payer: Self-pay | Admitting: Family Medicine

## 2016-10-24 ENCOUNTER — Emergency Department (HOSPITAL_COMMUNITY)
Admission: EM | Admit: 2016-10-24 | Discharge: 2016-10-24 | Disposition: A | Discharge status: Home / Self Care | Payer: Medicaid Other | Attending: Emergency Medicine | Admitting: Emergency Medicine

## 2016-10-24 ENCOUNTER — Telehealth: Payer: Self-pay | Admitting: Family Medicine

## 2016-10-24 ENCOUNTER — Encounter (HOSPITAL_COMMUNITY): Payer: Self-pay

## 2016-10-24 DIAGNOSIS — R1013 Epigastric pain: Secondary | ICD-10-CM | POA: Insufficient documentation

## 2016-10-24 DIAGNOSIS — R5383 Other fatigue: Secondary | ICD-10-CM | POA: Diagnosis not present

## 2016-10-24 DIAGNOSIS — I10 Essential (primary) hypertension: Secondary | ICD-10-CM | POA: Diagnosis not present

## 2016-10-24 DIAGNOSIS — F909 Attention-deficit hyperactivity disorder, unspecified type: Secondary | ICD-10-CM | POA: Diagnosis not present

## 2016-10-24 DIAGNOSIS — Z79899 Other long term (current) drug therapy: Secondary | ICD-10-CM | POA: Insufficient documentation

## 2016-10-24 DIAGNOSIS — F17210 Nicotine dependence, cigarettes, uncomplicated: Secondary | ICD-10-CM | POA: Diagnosis not present

## 2016-10-24 DIAGNOSIS — E038 Other specified hypothyroidism: Secondary | ICD-10-CM | POA: Diagnosis not present

## 2016-10-24 DIAGNOSIS — E039 Hypothyroidism, unspecified: Secondary | ICD-10-CM

## 2016-10-24 DIAGNOSIS — R1010 Upper abdominal pain, unspecified: Secondary | ICD-10-CM | POA: Diagnosis present

## 2016-10-24 LAB — CBC WITH DIFFERENTIAL/PLATELET
Basophils Absolute: 0.1 10*3/uL (ref 0.0–0.1)
Basophils Relative: 1 %
EOS PCT: 3 %
Eosinophils Absolute: 0.4 10*3/uL (ref 0.0–0.7)
HCT: 45.7 % (ref 36.0–46.0)
HEMOGLOBIN: 15.3 g/dL — AB (ref 12.0–15.0)
LYMPHS ABS: 2.7 10*3/uL (ref 0.7–4.0)
LYMPHS PCT: 21 %
MCH: 30.8 pg (ref 26.0–34.0)
MCHC: 33.5 g/dL (ref 30.0–36.0)
MCV: 92.1 fL (ref 78.0–100.0)
MONOS PCT: 4 %
Monocytes Absolute: 0.5 10*3/uL (ref 0.1–1.0)
NEUTROS PCT: 71 %
Neutro Abs: 9 10*3/uL — ABNORMAL HIGH (ref 1.7–7.7)
Platelets: 302 10*3/uL (ref 150–400)
RBC: 4.96 MIL/uL (ref 3.87–5.11)
RDW: 13.4 % (ref 11.5–15.5)
WBC: 12.6 10*3/uL — AB (ref 4.0–10.5)

## 2016-10-24 LAB — COMPREHENSIVE METABOLIC PANEL
ALK PHOS: 56 U/L (ref 38–126)
ALT: 10 U/L — AB (ref 14–54)
AST: 13 U/L — ABNORMAL LOW (ref 15–41)
Albumin: 4 g/dL (ref 3.5–5.0)
Anion gap: 9 (ref 5–15)
BUN: 11 mg/dL (ref 6–20)
CALCIUM: 8.8 mg/dL — AB (ref 8.9–10.3)
CO2: 23 mmol/L (ref 22–32)
CREATININE: 1.03 mg/dL — AB (ref 0.44–1.00)
Chloride: 102 mmol/L (ref 101–111)
Glucose, Bld: 105 mg/dL — ABNORMAL HIGH (ref 65–99)
Potassium: 4.2 mmol/L (ref 3.5–5.1)
Sodium: 134 mmol/L — ABNORMAL LOW (ref 135–145)
TOTAL PROTEIN: 7.4 g/dL (ref 6.5–8.1)
Total Bilirubin: 0.5 mg/dL (ref 0.3–1.2)

## 2016-10-24 LAB — TSH: TSH: 35.231 u[IU]/mL — ABNORMAL HIGH (ref 0.350–4.500)

## 2016-10-24 LAB — LIPASE, BLOOD: LIPASE: 23 U/L (ref 11–51)

## 2016-10-24 LAB — MONONUCLEOSIS SCREEN: MONO SCREEN: NEGATIVE

## 2016-10-24 MED ORDER — ONDANSETRON HCL 4 MG/2ML IJ SOLN
4.0000 mg | Freq: Once | INTRAMUSCULAR | Status: AC
  Administered 2016-10-24: 4 mg via INTRAVENOUS
  Filled 2016-10-24: qty 2

## 2016-10-24 MED ORDER — GI COCKTAIL ~~LOC~~
30.0000 mL | Freq: Once | ORAL | Status: AC
  Administered 2016-10-24: 30 mL via ORAL
  Filled 2016-10-24: qty 30

## 2016-10-24 MED ORDER — PANTOPRAZOLE SODIUM 20 MG PO TBEC: 20.0000 mg | DELAYED_RELEASE_TABLET | Freq: Every day | ORAL | 0 refills | Status: DC

## 2016-10-24 MED ORDER — LEVOTHYROXINE SODIUM 137 MCG PO TABS: 137.0000 ug | ORAL_TABLET | Freq: Every day | ORAL | 4 refills | Status: DC

## 2016-10-24 MED ORDER — MORPHINE SULFATE (PF) 4 MG/ML IV SOLN
4.0000 mg | Freq: Once | INTRAVENOUS | Status: AC
  Administered 2016-10-24: 4 mg via INTRAVENOUS
  Filled 2016-10-24: qty 1

## 2016-10-24 NOTE — Telephone Encounter (Signed)
ER Doctor spoke with Dr.Scott Wolfgang Phoenix

## 2016-10-24 NOTE — ED Triage Notes (Signed)
Pt reports has been sleeping a lot for the past 3 weeks.  Also c/o feeling like lymph nodes in neck are swollen, abd pain, and nausea.  Reports has been hospitalized for low potassium and has history of sepsis.

## 2016-10-24 NOTE — ED Provider Notes (Signed)
[place] DEPT Provider Note   CSN: [id number] Arrival date & time: 10/24/16  1118     History   Chief Complaint Chief Complaint  Patient presents with  . Fatigue  . Abdominal Pain    HPI Sharon Atkins is a 46 y.o. female with history as outlined below and also including an episode of severe hypokalemia requiring admission presenting with episodic upper abdominal pain described as sharp, stabbing and lasting for 1-2 minutes, every 30 minutes or so since yesterday.  She also endorses nausea without emesis, denies fevers, chills, headache, diarrhea or constipation.  She additionally states she has been profoundly fatigued the past 3 weeks, stating she has been sleeping nearly around the clock x 3 weeks.  She has hypothyroidism, no recent changes in her dosing. She also reports having a mild sore throat which is improved but still has swollen tender lymph nodes under her chin.  She has found no alleviators for her symptoms.  The history is provided by the patient.    Past Medical History:  Diagnosis Date  . ADD (attention deficit disorder)   . Anxiety   . Arthritis   . Chronic pain   . Depression   . Essential hypertension   . Fibromyalgia   . Goiter   . Hypothyroidism 06/17/2015  . Memory loss Since 03/2012    Patient Active Problem List   Diagnosis Date Noted  . Vitamin D deficiency 06/22/2016  . Encounter for long-term opiate analgesic use 03/31/2016  . Prediabetes 11/09/2015  . RTA (renal tubular acidosis) 09/28/2015  . Urinary retention 09/25/2015  . Acute hypokalemia 09/23/2015  . Weakness generalized 09/23/2015  . Elevated troponin I level 09/23/2015  . Elevated CK 09/23/2015  . UTI (lower urinary tract infection) 06/17/2015  . Hypothyroidism 06/17/2015  . MDD (major depressive disorder) 02/25/2015  . ADD (attention deficit disorder) 05/17/2013  . Transaminitis 05/17/2013  . Tobacco abuse 05/17/2013  . Muscle jerks during sleep 05/17/2013  . Junctional  bradycardia 09/16/2012  . Unspecified hypothyroidism 12/31/2011  . Depression   . Hypertension   . Fibromyalgia     Past Surgical History:  Procedure Laterality Date  . ABDOMINAL HYSTERECTOMY    . ABDOMINAL SURGERY    . APPENDECTOMY    . [place], 2001  . PARTIAL HYSTERECTOMY  2009  . TONSILLECTOMY  1998    OB History    No data available       Home Medications    Prior to Admission medications   Medication Sig Start Date End Date Taking? Authorizing Provider  atenolol (TENORMIN) 50 MG tablet Take 50 mg by mouth daily.   Yes [provider]  cyanocobalamin (CYANOCOBALAMIN) 1000 MCG tablet Take 1 tablet (1,000 mcg total) by mouth daily. 09/28/15  Yes Kathie Dike, MD  DULoxetine (CYMBALTA) 60 MG capsule Take 1 capsule (60 mg total) by mouth daily. 06/22/16  Yes Nilda Simmer, NP  folic acid (FOLVITE) 1 MG tablet Take 1 tablet (1 mg total) by mouth daily. 09/28/15  Yes Kathie Dike, MD  ibuprofen (ADVIL,MOTRIN) 200 MG tablet Take 200 mg by mouth every 6 (six) hours as needed for moderate pain.   Yes [provider]  levothyroxine (SYNTHROID, LEVOTHROID) 112 MCG tablet Take 1 tablet (112 mcg total) by mouth daily. 11/09/15  Yes Nilda Simmer, NP  pregabalin (LYRICA) 75 MG capsule Take 150 mg by mouth at bedtime.   Yes [provider]  tiZANidine (ZANAFLEX) 4 MG capsule Take 1  capsule (4 mg total) by mouth 3 (three) times daily as needed for muscle spasms. 06/22/16  Yes Nilda Simmer, NP  metFORMIN (GLUCOPHAGE) 500 MG tablet Take 1 tablet (500 mg total) by mouth 2 (two) times daily with a meal. Patient not taking: Reported on 10/24/2016 11/09/15   Nilda Simmer, NP  oxyCODONE-acetaminophen (PERCOCET/ROXICET) 5-325 MG tablet Take 1 tablet by mouth every 6 (six) hours as needed for severe pain. Patient not taking: Reported on 10/24/2016 06/22/16   Nilda Simmer, NP  pantoprazole (PROTONIX) 20 MG tablet Take 1 tablet (20 mg  total) by mouth daily. 10/24/16   Evalee Jefferson, PA-C  potassium chloride SA (K-DUR,KLOR-CON) 20 MEQ tablet Take 2 tablets (40 mEq total) by mouth daily. Patient not taking: Reported on 10/24/2016 10/04/15   Nilda Simmer, NP  SODIUM BICARBONATE PO Take by mouth.    [provider]  Vitamin D, Ergocalciferol, (DRISDOL) 50000 units CAPS capsule Take 1 capsule (50,000 Units total) by mouth every 7 (seven) days. Patient not taking: Reported on 10/24/2016 06/22/16   Nilda Simmer, NP    Family History Family History  Problem Relation Age of Onset  . Hypertension Other        Parent    Social History Social History  Substance Use Topics  . Smoking status: Current Some Day Smoker    Packs/day: 0.50    Years: 10.00    Types: Cigarettes  . Smokeless tobacco: Former Systems developer    Quit date: 09/26/2015  . Alcohol use No     Allergies   Hct [hydrochlorothiazide] and Lyrica [pregabalin]   Review of Systems Review of Systems  Constitutional: Positive for fatigue. Negative for fever.  HENT: Positive for sore throat. Negative for congestion and trouble swallowing.   Eyes: Negative.   Respiratory: Negative for chest tightness and shortness of breath.   Cardiovascular: Negative for chest pain.  Gastrointestinal: Positive for abdominal pain and nausea. Negative for constipation, diarrhea and vomiting.  Genitourinary: Negative.   Musculoskeletal: Negative for arthralgias, joint swelling and neck pain.  Skin: Negative.  Negative for rash and wound.  Neurological: Negative for dizziness, light-headedness, numbness and headaches.  Psychiatric/Behavioral: Negative.      Physical Exam Updated Vital Signs BP 98/71   Pulse 77   Temp 98.9 F (37.2 C) (Oral)   Resp 18   Ht 5' (1.524 m)   Wt 64.4 kg (142 lb)   SpO2 98%   BMI 27.73 kg/m   Physical Exam  Constitutional: She appears well-developed and well-nourished.  HENT:  Head: Normocephalic and atraumatic.  Mouth/Throat:  Uvula is midline, oropharynx is clear and moist and mucous membranes are normal. No trismus in the jaw. No uvula swelling. Tonsils are 0 on the right. Tonsils are 0 on the left.  Eyes: Conjunctivae are normal.  Neck: Normal range of motion.  Cardiovascular: Normal rate, regular rhythm, normal heart sounds and intact distal pulses.   Pulmonary/Chest: Effort normal and breath sounds normal. She has no wheezes.  Abdominal: Soft. Bowel sounds are normal. There is no tenderness.  Musculoskeletal: Normal range of motion.  Lymphadenopathy:       Head (right side): Submandibular adenopathy present.       Head (left side): Submandibular adenopathy present.  Neurological: She is alert.  Skin: Skin is warm and dry.  Psychiatric: She has a normal mood and affect.  Nursing note and vitals reviewed.    ED Treatments / Results  Labs (all labs ordered are listed,  but only abnormal results are displayed) Labs Reviewed  CBC WITH DIFFERENTIAL/PLATELET - Abnormal; Notable for the following:       Result Value   WBC 12.6 (*)    Hemoglobin 15.3 (*)    Neutro Abs 9.0 (*)    All other components within normal limits  COMPREHENSIVE METABOLIC PANEL - Abnormal; Notable for the following:    Sodium 134 (*)    Glucose, Bld 105 (*)    Creatinine, Ser 1.03 (*)    Calcium 8.8 (*)    AST 13 (*)    ALT 10 (*)    All other components within normal limits  TSH - Abnormal; Notable for the following:    TSH 35.231 (*)    All other components within normal limits  LIPASE, BLOOD  MONONUCLEOSIS SCREEN    EKG  EKG Interpretation None       Radiology No results found.  Procedures Procedures (including critical care time)  Medications Ordered in ED Medications  gi cocktail (Maalox,Lidocaine,Donnatal) (not administered)  ondansetron (ZOFRAN) injection 4 mg (4 mg Intravenous Given 10/24/16 1256)  morphine 4 MG/ML injection 4 mg (4 mg Intravenous Given 10/24/16 1255)     Initial Impression / Assessment  and Plan / ED Course  I have reviewed the triage vital signs and the nursing notes.  Pertinent labs & imaging results that were available during my care of the patient were reviewed by me and considered in my medical decision making (see chart for details).     Pt with fatigue, vague epigastric pain.  Subtherapeutic with synthroid. Discussed with Dr. Wolfgang Phoenix who will increase her dose, pt to pick up at pharmacy.  Suggested adding PPI, protonix prescribed x 2 weeks.  Plan f/u with pcp within the next 1-2 weeks.  Pt agrees with and understands plan.  The patient appears reasonably screened and/or stabilized for discharge and I doubt any other medical condition or other [facility] requiring further screening, evaluation, or treatment in the ED at this time prior to discharge.   Final Clinical Impressions(s) / ED Diagnoses   Final diagnoses:  Other specified hypothyroidism  Fatigue, unspecified type  Epigastric pain    New Prescriptions New Prescriptions   PANTOPRAZOLE (PROTONIX) 20 MG TABLET    Take 1 tablet (20 mg total) by mouth daily.     Evalee Jefferson, PA-C 10/24/16 1509    Margette Fast, MD 10/24/16 [phone]

## 2016-10-24 NOTE — Telephone Encounter (Signed)
Nurse's-call the patient-inform them of the following. Patient went to the ER, TSH elevated, I did discuss this with the ER provider. I sent in new dose of levothyroxine 137 g-old dose was 112-she should take the new dose 1 daily she should do a follow-up visit with [place] within 2 weeks. I recommend repeating TSH along with a free T4 in approximately 10-12 weeks. Also ER started PPI we will recheck abdominal discomfort when she follows up within the next 7-14 days either with Hoyle Sauer or myself.

## 2016-10-24 NOTE — Discharge Instructions (Signed)
Dr. Wolfgang Phoenix is calling a new thyroid medication in for you to take in place of your current dose.  Take the protonix for the next 2 weeks and follow up with his office within the next 2 weeks as discussed.

## 2016-10-25 NOTE — Telephone Encounter (Signed)
Patient advised TSH elevated, Dr Nicki Reaper did discuss this with the ER provider. Dr Nicki Reaper sent in new dose of levothyroxine 137 g-old dose was 112-she should take the new dose 1 daily she should do a follow-up visit with [place] within 2 weeks. Dr Nicki Reaper recommends repeating TSH along with a free T4 in approximately 10-12 weeks. Also ER started PPI we will recheck abdominal discomfort when she follows up within the next 7-14 days either with Hoyle Sauer or myself. Patient verbalized understanding. Blood work ordered in [facility].

## 2016-10-25 NOTE — Addendum Note (Signed)
Addended by: Dairl Ponder on: 10/25/2016 08:17 AM   Modules accepted: Orders

## 2017-09-10 IMAGING — DX DG CHEST 1V
1 series · 1 of 1 positions shown · non-contrast
Comparison: Chest x-rays dated 06/17/2015 and 05/17/2013.

CLINICAL DATA: Weakness since yesterday.  Unable to move.

EXAM:
CHEST 1 VIEW

[chest ap]
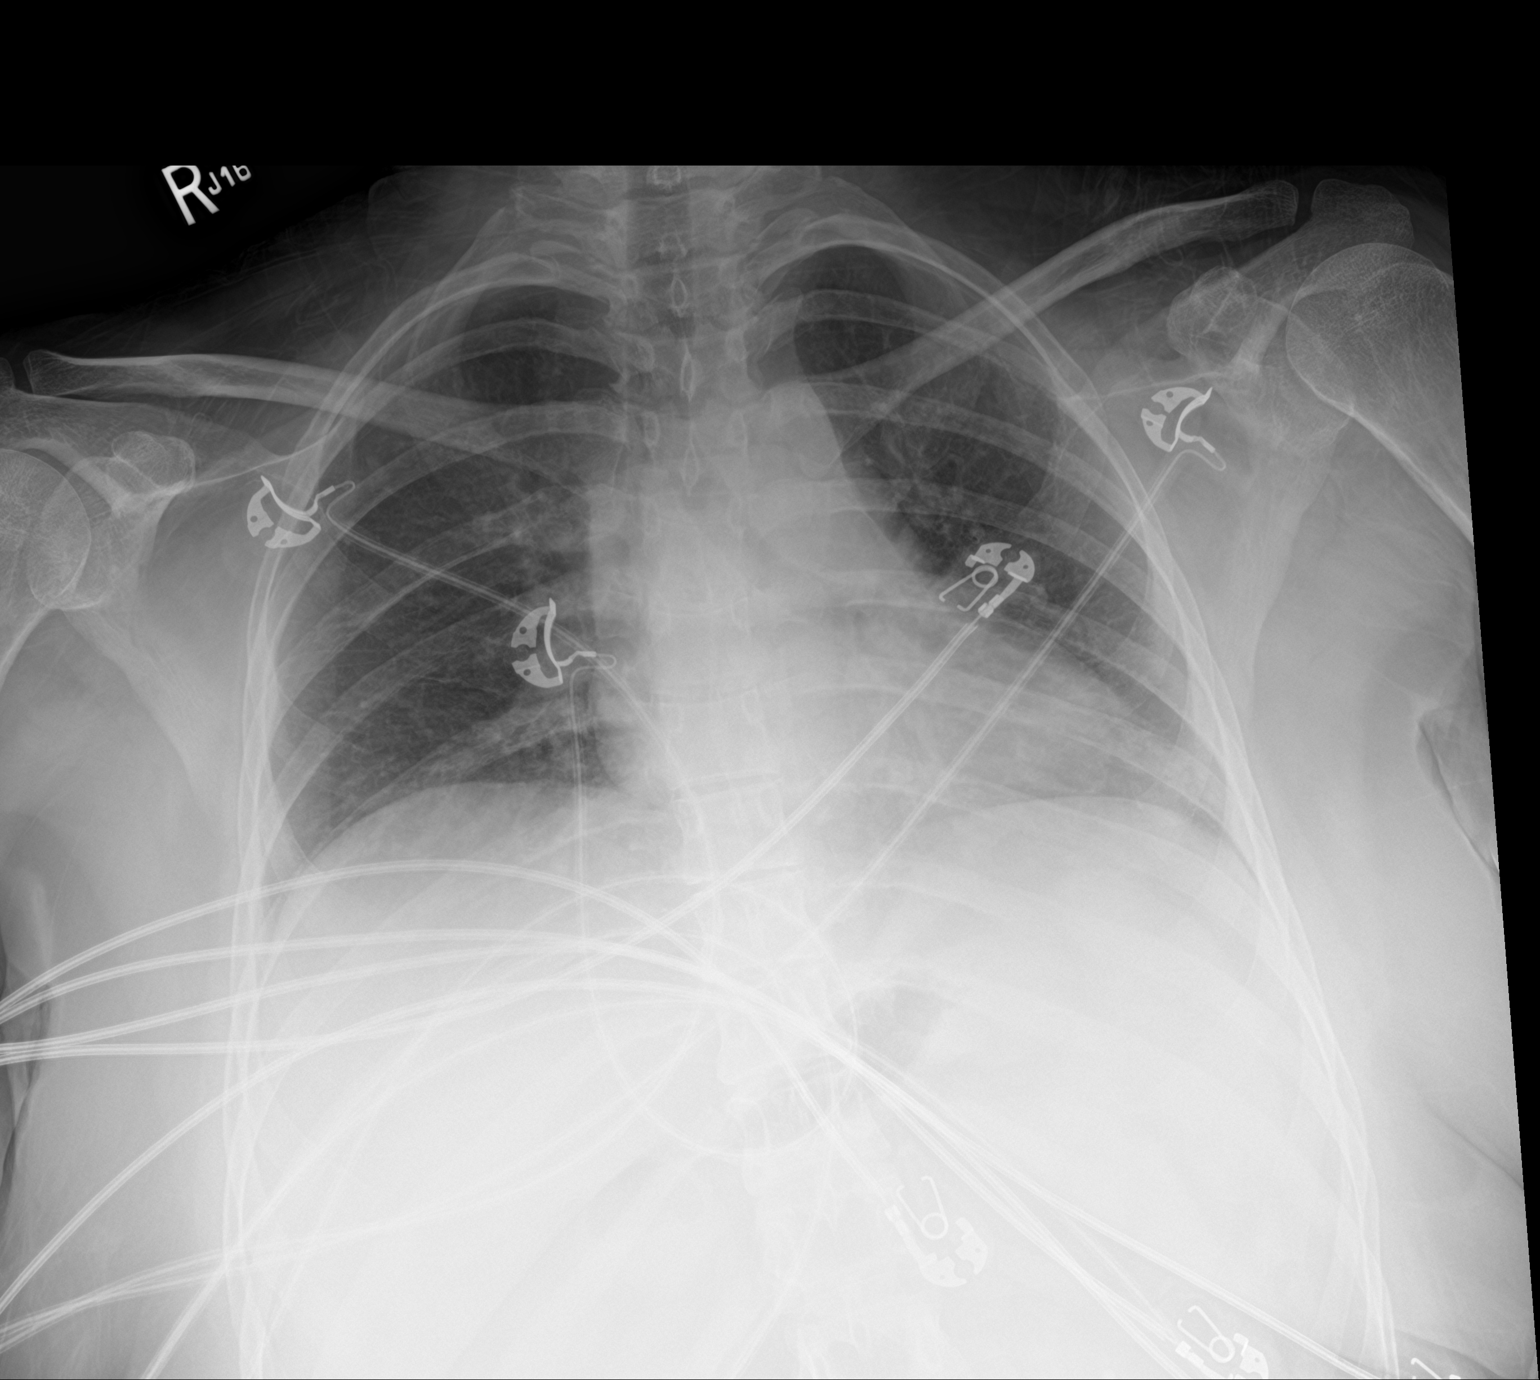

[1 of 1 positions shown; findings below may reference images not displayed]

FINDINGS: Study is hypoinspiratory with crowding of the perihilar and
bibasilar bronchovascular markings. Given the low lung volumes,
lungs are clear. No evidence of pneumonia. No pleural effusion or
pneumothorax. Osseous and soft tissue structures about the chest are
unremarkable.
IMPRESSION: Low lung volumes.  No active disease.

## 2017-09-11 IMAGING — CR DG CHEST 1V PORT
2 series · 2 of 2 positions shown · non-contrast
Comparison: Portable exam 9364 hours compared to 09/23/2015

CLINICAL DATA: PICC line placement

EXAM:
PORTABLE CHEST 1 VIEW

[ap portable (1 of 2)]
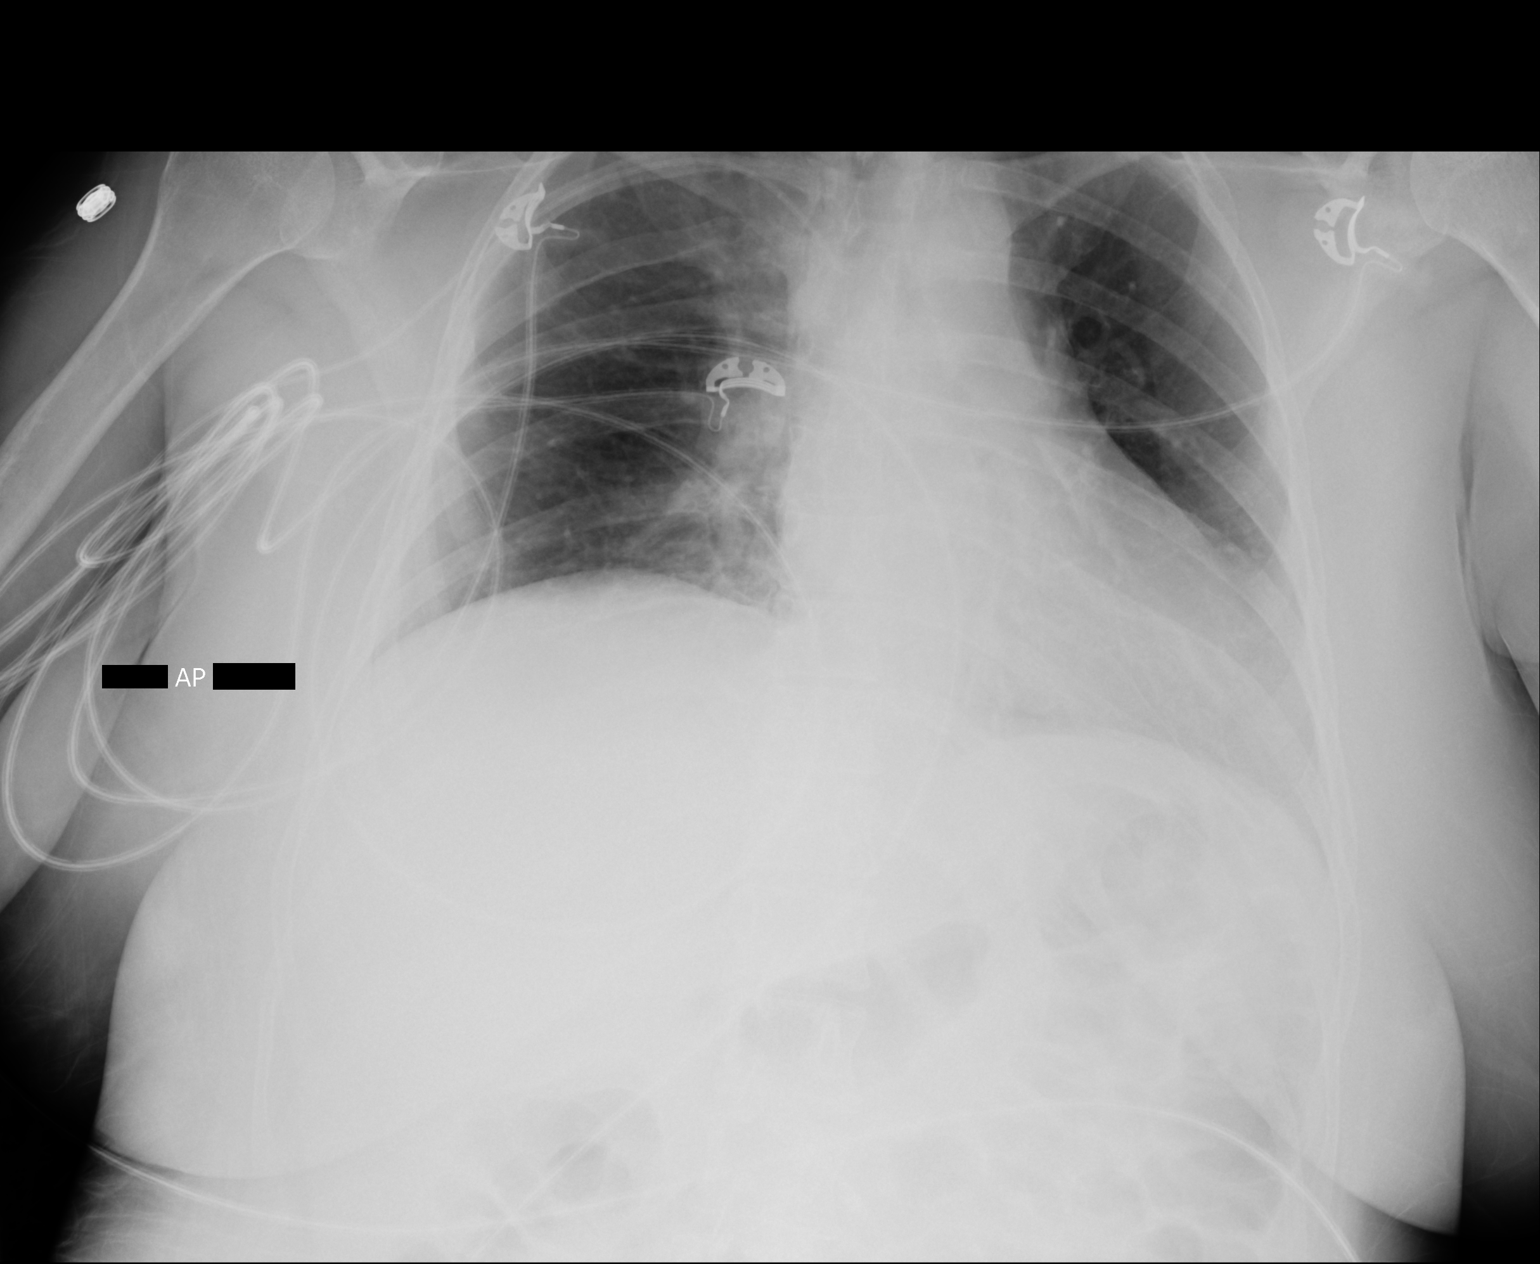

[ap portable (2 of 2)]
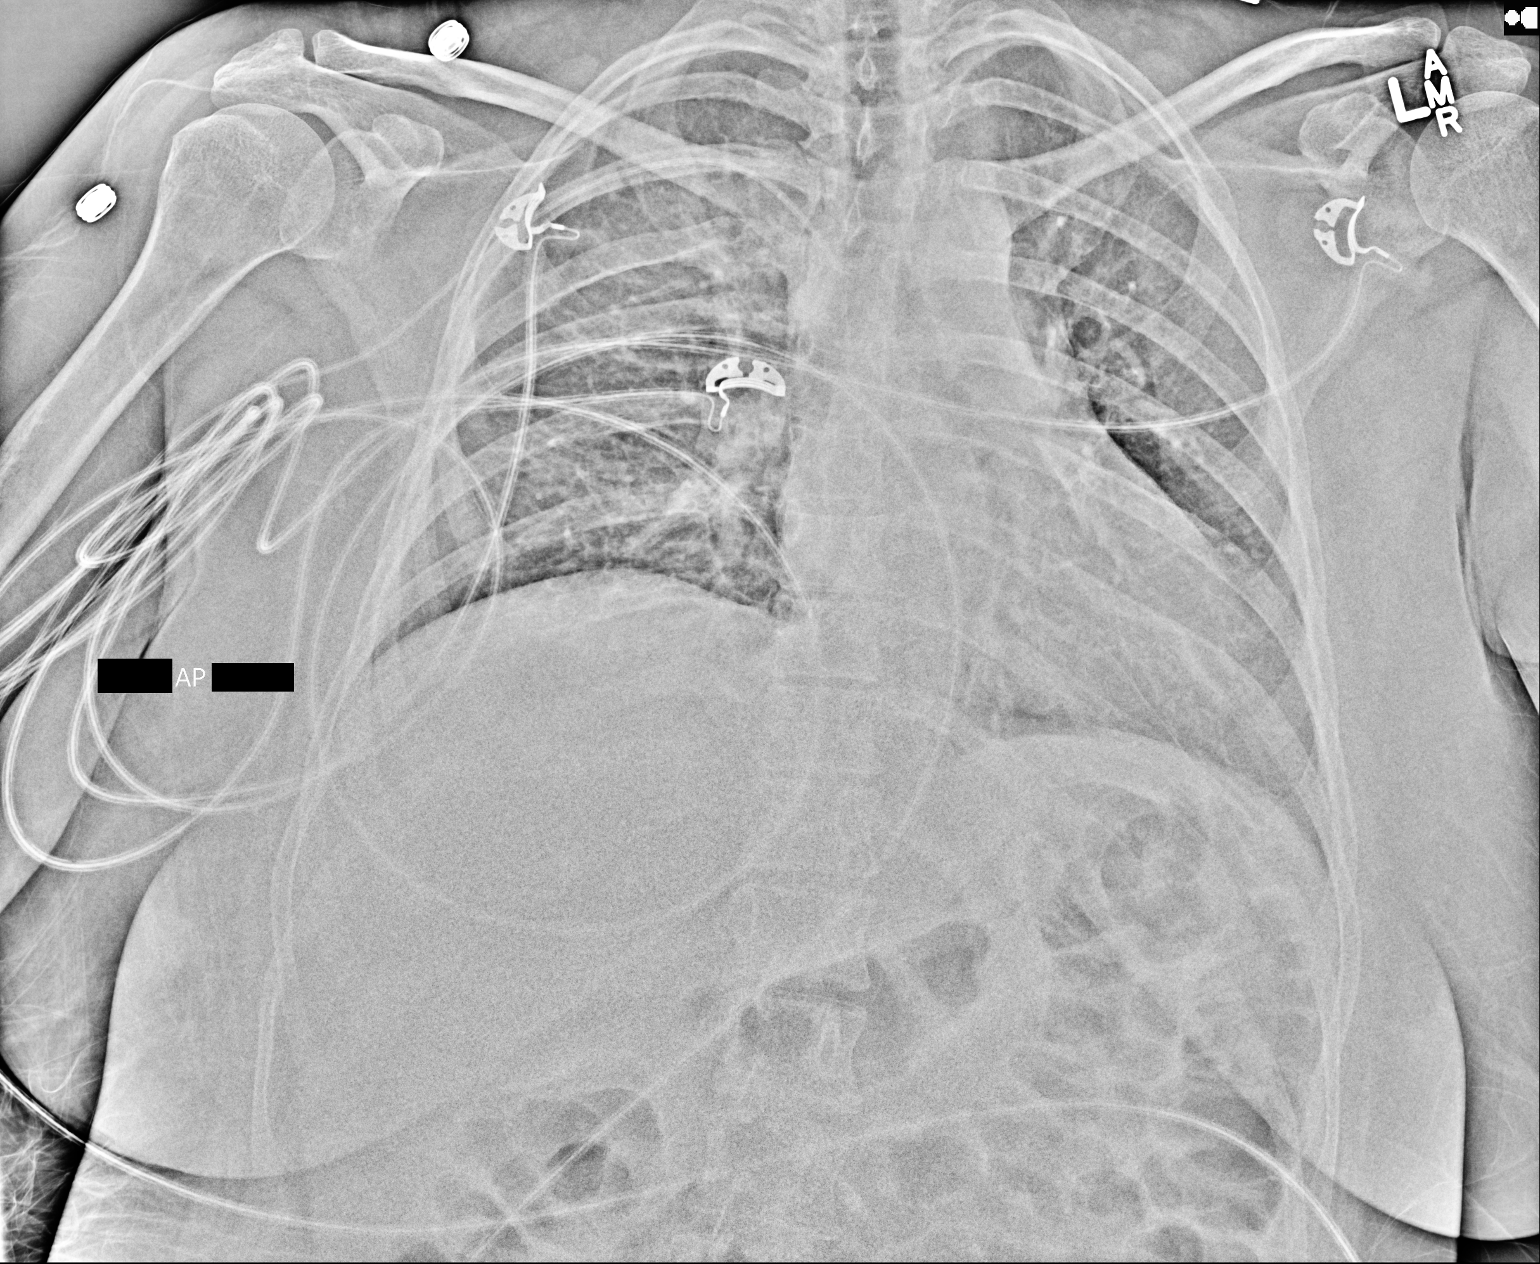

[2 of 2 positions shown; findings below may reference images not displayed]

FINDINGS: RIGHT arm PICC line with tip projecting at confluence of the SVC.

Normal heart size, mediastinal contours, and pulmonary vascularity.

Mild RIGHT basilar atelectasis.

Lungs otherwise clear.

No pleural effusion or pneumothorax.
IMPRESSION: Tip of RIGHT arm PICC line projects over the confluence of the SVC.

Subsegmental atelectasis RIGHT base.

## 2017-09-22 IMAGING — US US RENAL
1 series · 14 of 25 positions shown · non-contrast
Comparison: None.

CLINICAL DATA: Urine retention for 2 days.

EXAM:
RENAL / URINARY TRACT ULTRASOUND COMPLETE

[Series 1: us renal · 0.28mm/px · 14 of 42 slices shown]
[im 1/42]
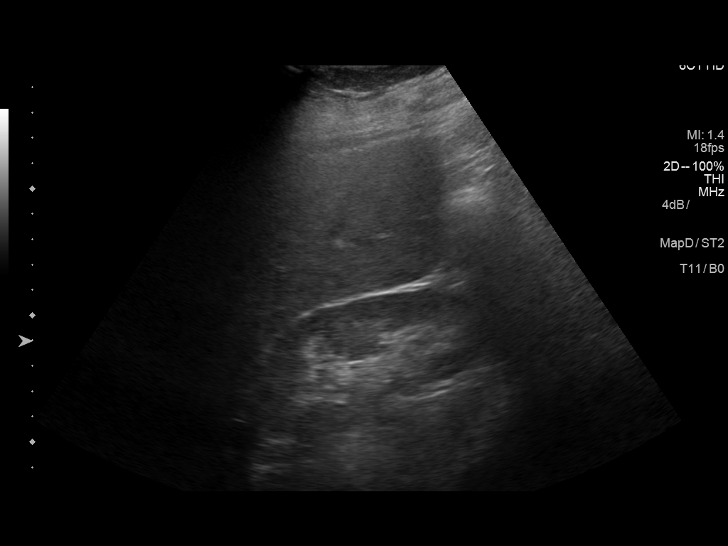
[im 4/42]
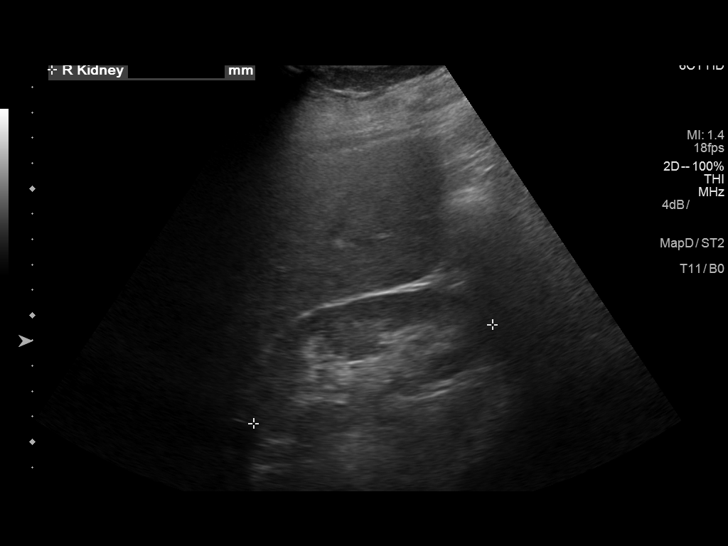
[im 7/42]
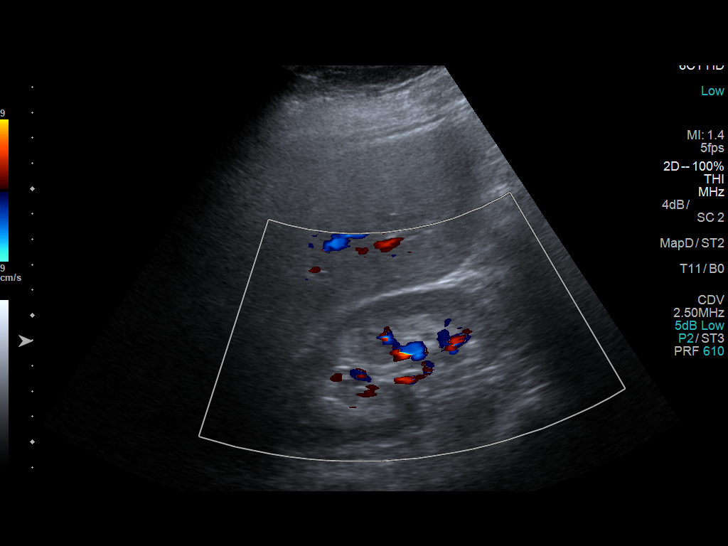
[im 11/42]
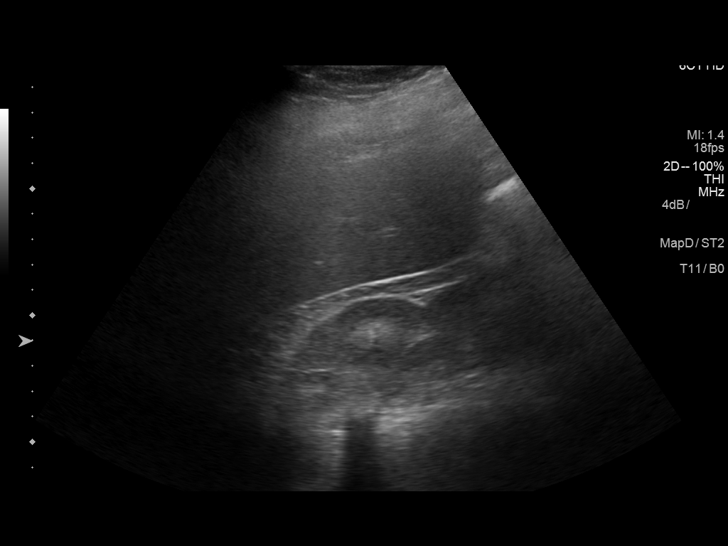
[im 14/42]
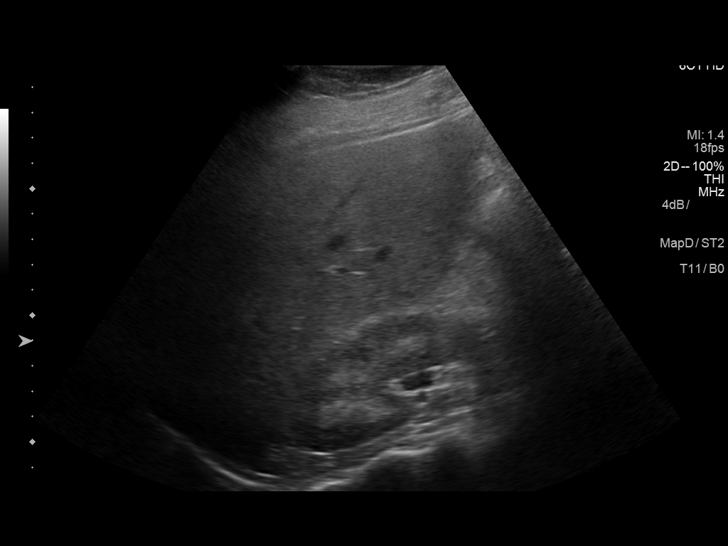
[im 16/42]
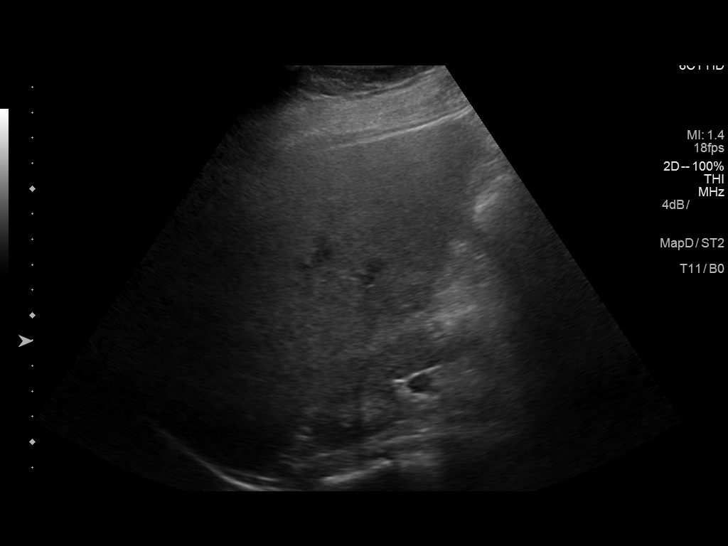
[im 19/42]
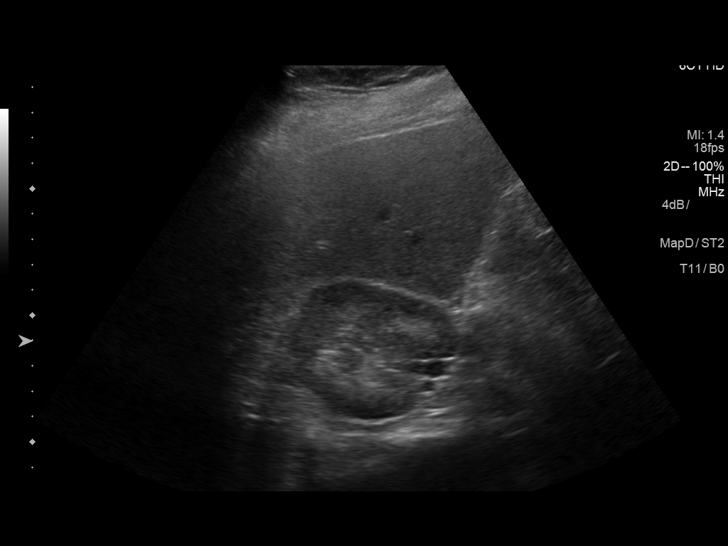
[im 23/42]
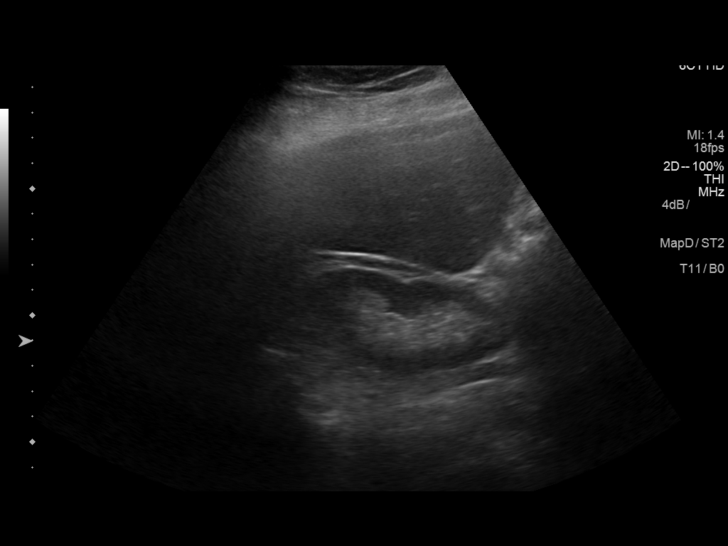
[im 26/42]
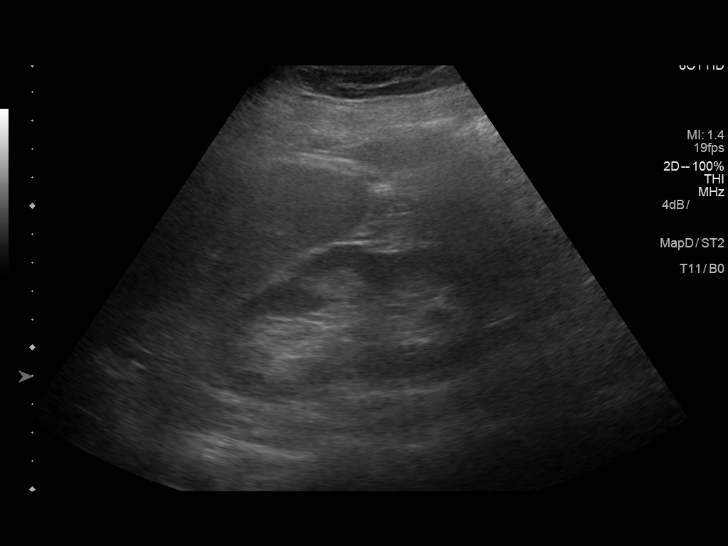
[im 28/42]
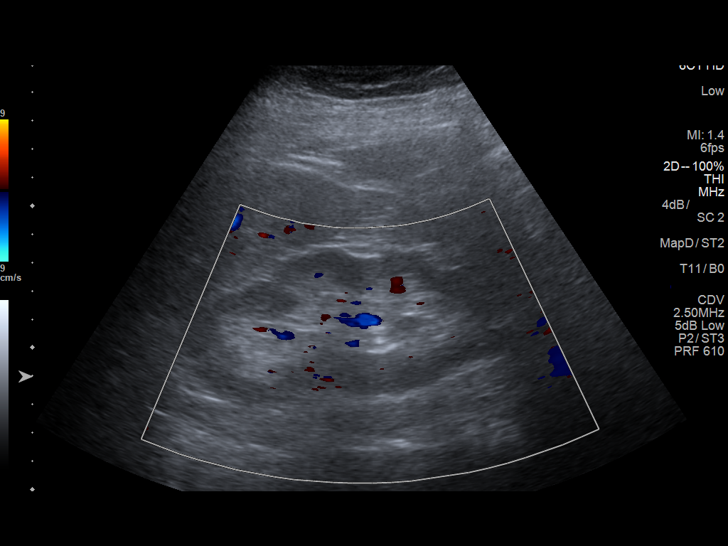
[im 31/42]
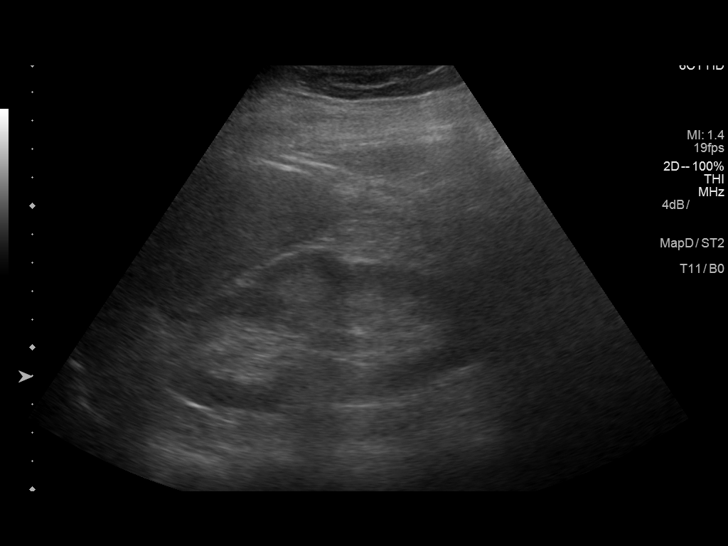
[im 35/42]
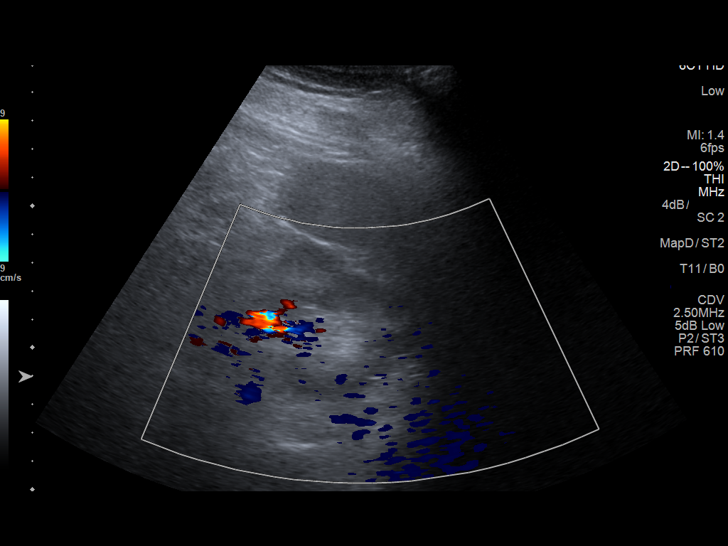
[im 38/42]
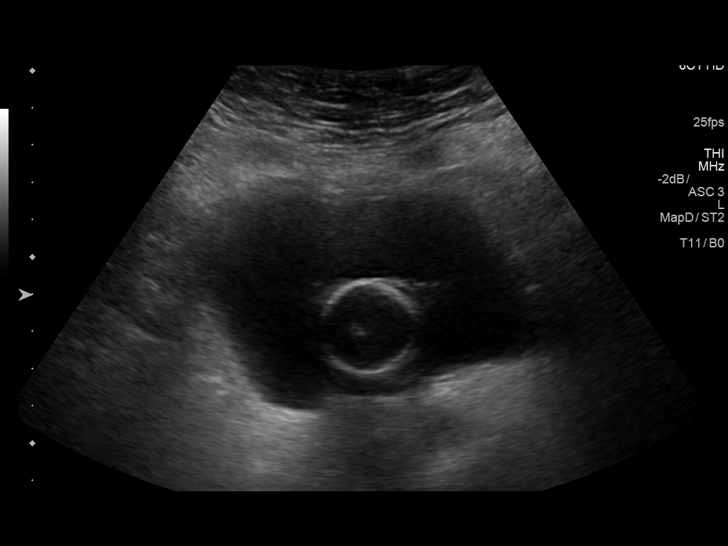
[im 42/42]
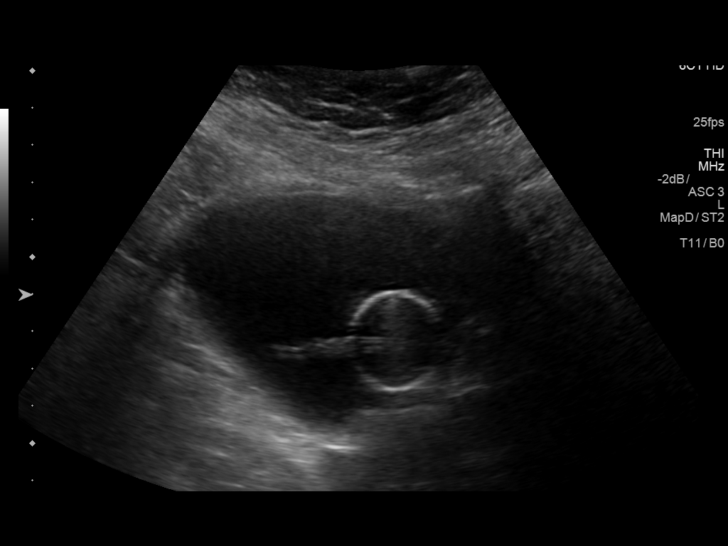

[14 of 25 positions shown; findings below may reference images not displayed]

FINDINGS: Right Kidney:

Length: 10.2 cm. Echogenicity within normal limits. No mass or
hydronephrosis visualized.

Left Kidney:

Length: 11.8 cm. Echogenicity within normal limits. No mass or
hydronephrosis visualized.

Bladder:

Appears normal for degree of bladder distention. Urinary Foley is in
place.
IMPRESSION: Normal appearance of bilateral kidneys.

Moderate distention of the urinary bladder with Foley catheter in
place. If the catheter had not been intentionally occluded, this
suggests in proper draining of the catheter.

## 2019-04-21 ENCOUNTER — Ambulatory Visit: Payer: Medicaid Other | Attending: Internal Medicine

## 2019-04-21 ENCOUNTER — Other Ambulatory Visit: Payer: Self-pay

## 2019-04-21 DIAGNOSIS — Z20822 Contact with and (suspected) exposure to covid-19: Secondary | ICD-10-CM

## 2019-04-22 LAB — NOVEL CORONAVIRUS, NAA: SARS-CoV-2, NAA: NOT DETECTED

## 2019-05-14 ENCOUNTER — Encounter: Payer: Self-pay | Admitting: Family Medicine

## 2019-10-28 ENCOUNTER — Ambulatory Visit
Admission: EM | Admit: 2019-10-28 | Discharge: 2019-10-28 | Disposition: A | Discharge status: Home / Self Care | Payer: Medicaid Other | Attending: Emergency Medicine | Admitting: Emergency Medicine

## 2019-10-28 ENCOUNTER — Other Ambulatory Visit: Payer: Self-pay

## 2019-10-28 ENCOUNTER — Encounter: Payer: Self-pay | Admitting: Emergency Medicine

## 2019-10-28 DIAGNOSIS — J029 Acute pharyngitis, unspecified: Secondary | ICD-10-CM | POA: Insufficient documentation

## 2019-10-28 DIAGNOSIS — R590 Localized enlarged lymph nodes: Secondary | ICD-10-CM

## 2019-10-28 LAB — POCT RAPID STREP A (OFFICE): Rapid Strep A Screen: NEGATIVE

## 2019-10-28 LAB — POCT MONO SCREEN (KUC): Mono, POC: NEGATIVE

## 2019-10-28 MED ORDER — MELOXICAM 15 MG PO TABS: 15.0000 mg | ORAL_TABLET | Freq: Every day | ORAL | 0 refills | Status: DC

## 2019-10-28 NOTE — ED Provider Notes (Signed)
[place]   [id number] 10/28/19 Arrival Time: 32   CC: Sore throat and lymphadenopathy  SUBJECTIVE: History from: patient.  Sharon Atkins [place] is a 49 y.o. female who presents with fatigue, sore throat, and lymphadenopathy x 4 days.  Denies sick exposure to COVID, flu or strep.  Has tried OTC medications without relief.  Denies aggravating factors.  Reports previous symptoms in the past with mono.   Denies fever, chills, fatigue, sinus pain, rhinorrhea, cough, SOB, wheezing, chest pain, nausea, changes in bowel or bladder habits.    Received covid vaccines.   ROS: As per HPI.  All other pertinent ROS negative.     Past Medical History:  Diagnosis Date  . ADD (attention deficit disorder)   . Anxiety   . Arthritis   . Chronic pain   . Depression   . Essential hypertension   . Fibromyalgia   . Goiter   . Hypothyroidism 06/17/2015  . Memory loss Since 03/2012   Past Surgical History:  Procedure Laterality Date  . ABDOMINAL HYSTERECTOMY    . ABDOMINAL SURGERY    . APPENDECTOMY    . [place], 2001  . PARTIAL HYSTERECTOMY  2009  . TONSILLECTOMY  1998   Allergies  Allergen Reactions  . Hct [Hydrochlorothiazide] Other (See Comments)    "affects potassium more than normal"  . Lyrica [Pregabalin] Other (See Comments)    "makes her feel stupid"   No current facility-administered medications on file prior to encounter.   Current Outpatient Medications on File Prior to Encounter  Medication Sig Dispense Refill  . ibuprofen (ADVIL,MOTRIN) 200 MG tablet Take 200 mg by mouth every 6 (six) hours as needed for moderate pain.    . [DISCONTINUED] atenolol (TENORMIN) 50 MG tablet Take 50 mg by mouth daily.    . [DISCONTINUED] DULoxetine (CYMBALTA) 60 MG capsule Take 1 capsule (60 mg total) by mouth daily. 30 capsule 5  . [DISCONTINUED] levothyroxine (SYNTHROID, LEVOTHROID) 137 MCG tablet Take 1 tablet (137 mcg total) by mouth daily. 30 tablet 4  .  [DISCONTINUED] metFORMIN (GLUCOPHAGE) 500 MG tablet Take 1 tablet (500 mg total) by mouth 2 (two) times daily with a meal. (Patient not taking: Reported on 10/24/2016) 60 tablet 2  . [DISCONTINUED] pantoprazole (PROTONIX) 20 MG tablet Take 1 tablet (20 mg total) by mouth daily. 14 tablet 0  . [DISCONTINUED] potassium chloride SA (K-DUR,KLOR-CON) 20 MEQ tablet Take 2 tablets (40 mEq total) by mouth daily. (Patient not taking: Reported on 10/24/2016) 60 tablet 2   Social History   Socioeconomic History  . Marital status: Divorced    Spouse name: Not on file  . Number of children: 2  . Years of education: 47  . Highest education level: Not on file  Occupational History  . Occupation: Unemployed  Tobacco Use  . Smoking status: Current Some Day Smoker    Packs/day: 0.50    Years: 10.00    Pack years: 5.00    Types: Cigarettes  . Smokeless tobacco: Former Systems developer    Quit date: 09/26/2015  Substance and Sexual Activity  . Alcohol use: No    Alcohol/week: 0.0 standard drinks  . Drug use: No  . Sexual activity: Yes  Other Topics Concern  . Not on file  Social History Narrative   Regular exercise-yes   Caffeine Use-yes, 3-16oz daily   Patient lives at home with son.      Social Determinants of Health   Financial Resource Strain:   .  Difficulty of Paying Living Expenses:   Food Insecurity:   . Worried About Charity fundraiser in the Last Year:   . Arboriculturist in the Last Year:   Transportation Needs:   . Film/video editor (Medical):   Marland Kitchen Lack of Transportation (Non-Medical):   Physical Activity:   . Days of Exercise per Week:   . Minutes of Exercise per Session:   Stress:   . Feeling of Stress :   Social Connections:   . Frequency of Communication with Friends and Family:   . Frequency of Social Gatherings with Friends and Family:   . Attends Religious Services:   . Active Member of Clubs or Organizations:   . Attends Archivist Meetings:   Marland Kitchen Marital Status:    Intimate Partner Violence:   . Fear of Current or Ex-Partner:   . Emotionally Abused:   Marland Kitchen Physically Abused:   . Sexually Abused:    Family History  Problem Relation Age of Onset  . Hypertension Other        Parent    OBJECTIVE:  Vitals:   10/28/19 1425 10/28/19 1426  BP: 133/86   Pulse: 84   Resp: 16   Temp: 98.2 F (36.8 C)   TempSrc: Oral   SpO2: 97%   Weight:  159 lb (72.1 kg)     General appearance: alert; appears fatigued, but nontoxic; speaking in full sentences and tolerating own secretions HEENT: NCAT; Ears: EACs clear, TMs pearly gray; Eyes: PERRL.  EOM grossly intact; Nose: nares patent without rhinorrhea, Throat: oropharynx clear, soft palate and uvula with mild erythema, tonsils absent, uvula midline  Neck: +anterior cervical LAD Lungs: unlabored respirations, symmetrical air entry; cough: absent; no respiratory distress; CTAB Heart: regular rate and rhythm.   Skin: warm and dry Psychological: alert and cooperative; normal mood and affect  LABS:  Results for orders placed or performed during the hospital encounter of 10/28/19 (from the past 24 hour(s))  POCT rapid strep A     Status: Normal   Collection Time: 10/28/19  2:51 PM  Result Value Ref Range   Rapid Strep A Screen Negative Negative     ASSESSMENT & PLAN:  1. Lymphadenopathy, anterior cervical   2. Sore throat     Meds ordered this encounter  Medications  . meloxicam (MOBIC) 15 MG tablet    Sig: Take 1 tablet (15 mg total) by mouth daily.    Dispense:  30 tablet    Refill:  0    Order Specific Question:   Supervising Provider    Answer:   Raylene Everts [[id number]]   Strep negative.  Culture sent.  We will follow up with you regarding abnormal results Mono negative COVID testing ordered.  It will take between 2-5 days for test results.  Someone will contact you regarding abnormal results.    In the meantime: You should remain isolated in your home for 10 days from symptom onset  AND greater than 72 hours after symptoms resolution (absence of fever without the use of fever-reducing medication and improvement in respiratory symptoms), whichever is longer Get plenty of rest and push fluids Mobic prescribed.  Use as directed for pain  Follow up with PCP this week or next to ensure symptoms are improving Return or go to the ED if you have any new or worsening symptoms such as fever, worsening cough, shortness of breath, chest tightness, chest pain, turning blue, changes in mental status, etc..Marland Kitchen  Reviewed expectations re: course of current medical issues. Questions answered. Outlined signs and symptoms indicating need for more acute intervention. Patient verbalized understanding. After Visit Summary given.         Lestine Box, PA-C 10/28/19 1457

## 2019-10-28 NOTE — ED Triage Notes (Signed)
Fatigue since Friday and swollen lymph nodes in neck.

## 2019-10-28 NOTE — Discharge Instructions (Signed)
Strep negative.  Culture sent.  We will follow up with you regarding abnormal results Mono negative COVID testing ordered.  It will take between 2-5 days for test results.  Someone will contact you regarding abnormal results.    In the meantime: You should remain isolated in your home for 10 days from symptom onset AND greater than 72 hours after symptoms resolution (absence of fever without the use of fever-reducing medication and improvement in respiratory symptoms), whichever is longer Get plenty of rest and push fluids Mobic prescribed.  Use as directed for pain  Follow up with PCP this week or next to ensure symptoms are improving Return or go to the ED if you have any new or worsening symptoms such as fever, worsening cough, shortness of breath, chest tightness, chest pain, turning blue, changes in mental status, etc..Marland Kitchen

## 2019-10-29 LAB — NOVEL CORONAVIRUS, NAA: SARS-CoV-2, NAA: NOT DETECTED

## 2019-10-29 LAB — SARS-COV-2, NAA 2 DAY TAT

## 2019-11-01 LAB — CULTURE, GROUP A STREP (THRC)
Culture: NO GROWTH
Special Requests: NORMAL

## 2020-07-20 ENCOUNTER — Ambulatory Visit
Admission: EM | Admit: 2020-07-20 | Discharge: 2020-07-20 | Disposition: A | Discharge status: Home / Self Care | Payer: Self-pay | Attending: Emergency Medicine | Admitting: Emergency Medicine

## 2020-07-20 ENCOUNTER — Encounter: Payer: Self-pay | Admitting: Emergency Medicine

## 2020-07-20 ENCOUNTER — Other Ambulatory Visit: Payer: Self-pay

## 2020-07-20 DIAGNOSIS — R221 Localized swelling, mass and lump, neck: Secondary | ICD-10-CM

## 2020-07-20 DIAGNOSIS — R220 Localized swelling, mass and lump, head: Secondary | ICD-10-CM

## 2020-07-20 MED ORDER — AMOXICILLIN-POT CLAVULANATE 875-125 MG PO TABS: 1.0000 | ORAL_TABLET | Freq: Two times a day (BID) | ORAL | 0 refills | Status: AC

## 2020-07-20 MED ORDER — PREDNISONE 10 MG (21) PO TBPK
ORAL_TABLET | Freq: Every day | ORAL | 0 refills | Status: DC
Start: 2020-07-20 — End: 2020-11-10

## 2020-07-20 MED ORDER — DEXAMETHASONE SODIUM PHOSPHATE 10 MG/ML IJ SOLN
10.0000 mg | Freq: Once | INTRAMUSCULAR | Status: AC
  Administered 2020-07-20: 10 mg via INTRAMUSCULAR

## 2020-07-20 NOTE — Discharge Instructions (Addendum)
Decadron 10 mg shot given in office Rest push fluids Follow up with PCP in 24 hours to be reevaluated and to ensure your symptoms are improving Prednisone prescribed.  Take as directed and to completion Continue with benadryl Return sooner or go to the ED if you have any new or worsening symptoms such as difficulty breathing, shortness of breath, chest pain, nausea, vomiting, throat tightness or swelling, tongue swelling or tingling, worsening lip or facial swelling, abdominal pain, changes in bowel or bladder habits, no improvement despite medications, etc..Marland Kitchen

## 2020-07-20 NOTE — ED Provider Notes (Addendum)
[place]   [id number] 07/20/20 Arrival Time: 3825  Cc: Allergic reaction  SUBJECTIVE:  Sharon Atkins is a 50 y.o. female who presents with possible allergic reaction that began 2 days.  Report facial and gland swelling underneath chin.  Denies precipitating event, exposure, known allergy or trigger. Denies changes in medication or starting a new medication.  Has tried OTC medications without relief.  Symptoms are made worse with swallowing, slightly.  Tolerating own secretions and liquids.  Denies previous symptoms in the past.   Denies fever, chills, nausea, vomiting, erythema, oral manifestations such as throat swelling/ tingling, mouth swelling/ tingling, tongue swelling/tingling, dyspnea, SOB, chest pain, abdominal pain, changes in bowel or bladder function.     ROS: As per HPI.  All other pertinent ROS negative.     Past Medical History:  Diagnosis Date  . ADD (attention deficit disorder)   . Anxiety   . Arthritis   . Chronic pain   . Depression   . Essential hypertension   . Fibromyalgia   . Goiter   . Hypothyroidism 06/17/2015  . Memory loss Since 03/2012   Past Surgical History:  Procedure Laterality Date  . ABDOMINAL HYSTERECTOMY    . ABDOMINAL SURGERY    . APPENDECTOMY    . [place], 2001  . PARTIAL HYSTERECTOMY  2009  . TONSILLECTOMY  1998   Allergies  Allergen Reactions  . Hct [Hydrochlorothiazide] Other (See Comments)    "affects potassium more than normal"  . Lyrica [Pregabalin] Other (See Comments)    "makes her feel stupid"   No current facility-administered medications on file prior to encounter.   Current Outpatient Medications on File Prior to Encounter  Medication Sig Dispense Refill  . ibuprofen (ADVIL,MOTRIN) 200 MG tablet Take 200 mg by mouth every 6 (six) hours as needed for moderate pain.    . [DISCONTINUED] atenolol (TENORMIN) 50 MG tablet Take 50 mg by mouth daily.    . [DISCONTINUED] DULoxetine (CYMBALTA) 60 MG  capsule Take 1 capsule (60 mg total) by mouth daily. 30 capsule 5  . [DISCONTINUED] levothyroxine (SYNTHROID, LEVOTHROID) 137 MCG tablet Take 1 tablet (137 mcg total) by mouth daily. 30 tablet 4  . [DISCONTINUED] metFORMIN (GLUCOPHAGE) 500 MG tablet Take 1 tablet (500 mg total) by mouth 2 (two) times daily with a meal. (Patient not taking: Reported on 10/24/2016) 60 tablet 2  . [DISCONTINUED] pantoprazole (PROTONIX) 20 MG tablet Take 1 tablet (20 mg total) by mouth daily. 14 tablet 0  . [DISCONTINUED] potassium chloride SA (K-DUR,KLOR-CON) 20 MEQ tablet Take 2 tablets (40 mEq total) by mouth daily. (Patient not taking: Reported on 10/24/2016) 60 tablet 2    Social History   Socioeconomic History  . Marital status: Divorced    Spouse name: Not on file  . Number of children: 2  . Years of education: 31  . Highest education level: Not on file  Occupational History  . Occupation: Unemployed  Tobacco Use  . Smoking status: Current Some Day Smoker    Packs/day: 0.50    Years: 10.00    Pack years: 5.00    Types: Cigarettes  . Smokeless tobacco: Former Systems developer    Quit date: 09/26/2015  Substance and Sexual Activity  . Alcohol use: No    Alcohol/week: 0.0 standard drinks  . Drug use: No  . Sexual activity: Yes  Other Topics Concern  . Not on file  Social History Narrative   Regular exercise-yes   Caffeine Use-yes, 3-16oz  daily   Patient lives at home with son.      Social Determinants of Health   Financial Resource Strain: Not on file  Food Insecurity: Not on file  Transportation Needs: Not on file  Physical Activity: Not on file  Stress: Not on file  Social Connections: Not on file  Intimate Partner Violence: Not on file   Family History  Problem Relation Age of Onset  . Hypertension Other        Parent     OBJECTIVE:  Vitals:   07/20/20 1826  BP: 135/84  Pulse: 83  Temp: 98.2 F (36.8 C)  SpO2: 97%     General appearance: Alert, speaking in full sentences without  difficulty HEENT:NCAT; submandibular swelling, mildly TTP; Ears: EACs clear, TMs pearly gray; Eyes: PERRL.  EOM grossly intact. Nose: nares patent without rhinorrhea; Throat: tonsils nonerythematous or enlarged, uvula midline Neck: supple without LAD Lungs: clear to auscultation bilaterally without adventitious breath sounds; normal respiratory effort; no labored respirations Heart: regular rate and rhythm.   Skin: warm and dry Psychological: alert and cooperative; normal mood and affect  ASSESSMENT & PLAN:  1. Facial swelling   2. Submandibular swelling     Meds ordered this encounter  Medications  . predniSONE (STERAPRED UNI-PAK 21 TAB) 10 MG (21) TBPK tablet    Sig: Take by mouth daily. Take 6 tabs by mouth daily  for 2 days, then 5 tabs for 2 days, then 4 tabs for 2 days, then 3 tabs for 2 days, 2 tabs for 2 days, then 1 tab by mouth daily for 2 days    Dispense:  42 tablet    Refill:  0    Order Specific Question:   Supervising Provider    Answer:   Raylene Everts [[id number]]  . dexamethasone (DECADRON) injection 10 mg  . amoxicillin-clavulanate (AUGMENTIN) 875-125 MG tablet    Sig: Take 1 tablet by mouth every 12 (twelve) hours for 10 days.    Dispense:  20 tablet    Refill:  0    Order Specific Question:   Supervising Provider    Answer:   Raylene Everts [[id number]]    No orders of the defined types were placed in this encounter.    Decadron 10 mg shot given in office Rest push fluids Follow up with PCP in 24 hours to be reevaluated and to ensure your symptoms are improving Prednisone prescribed.  Take as directed and to completion Continue with benadryl Return sooner or go to the ED if you have any new or worsening symptoms such as difficulty breathing, shortness of breath, chest pain, nausea, vomiting, throat tightness or swelling, tongue swelling or tingling, worsening lip or facial swelling, abdominal pain, changes in bowel or bladder habits, no improvement  despite medications, etc...   Augmentin sent to cover for infection  Reviewed expectations re: course of current medical issues. Questions answered. Outlined signs and symptoms indicating need for more acute intervention. Patient verbalized understanding. After Visit Summary given.     Lestine Box, PA-C 07/20/20 2015    Stacey Drain [place], PA-C 07/20/20 2016

## 2020-07-20 NOTE — ED Triage Notes (Signed)
Lymph nodes around neck are swollen since Sunday.

## 2020-11-10 ENCOUNTER — Other Ambulatory Visit: Payer: Self-pay

## 2020-11-10 ENCOUNTER — Encounter: Payer: Self-pay | Admitting: Emergency Medicine

## 2020-11-10 ENCOUNTER — Ambulatory Visit
Admission: EM | Admit: 2020-11-10 | Discharge: 2020-11-10 | Disposition: A | Discharge status: Home / Self Care | Payer: Self-pay | Attending: Family Medicine | Admitting: Family Medicine

## 2020-11-10 DIAGNOSIS — N3090 Cystitis, unspecified without hematuria: Secondary | ICD-10-CM | POA: Insufficient documentation

## 2020-11-10 LAB — POCT URINALYSIS DIP (MANUAL ENTRY)
Bilirubin, UA: NEGATIVE
Glucose, UA: NEGATIVE mg/dL
Ketones, POC UA: NEGATIVE mg/dL
Nitrite, UA: POSITIVE — AB
Protein Ur, POC: NEGATIVE mg/dL
Spec Grav, UA: 1.015 (ref 1.010–1.025)
Urobilinogen, UA: 0.2 E.U./dL
pH, UA: 7 (ref 5.0–8.0)

## 2020-11-10 MED ORDER — FLUCONAZOLE 150 MG PO TABS: ORAL_TABLET | ORAL | 0 refills | Status: DC

## 2020-11-10 MED ORDER — CEPHALEXIN 500 MG PO CAPS: 500.0000 mg | ORAL_CAPSULE | Freq: Two times a day (BID) | ORAL | 0 refills | Status: DC

## 2020-11-10 NOTE — ED Triage Notes (Signed)
Lower back pain and urinate frequency with urgency x 2 days.  Has tried AZO without relief.

## 2020-11-10 NOTE — ED Provider Notes (Signed)
[place]    ASSESSMENT & PLAN:  1. Cystitis    Begin: Meds ordered this encounter  Medications   cephALEXin (KEFLEX) 500 MG capsule    Sig: Take 1 capsule (500 mg total) by mouth 2 (two) times daily.    Dispense:  10 capsule    Refill:  0   fluconazole (DIFLUCAN) 150 MG tablet    Sig: Take one tablet by mouth as a single dose. May repeat in 3 days if symptoms persist.    Dispense:  2 tablet    Refill:  0   No signs of pyelonephritis. Discussed. Urine culture sent. Will follow up with her PCP or here if not showing improvement over the next 48 hours, sooner if needed.  Outlined signs and symptoms indicating need for more acute intervention. Patient verbalized understanding. After Visit Summary given.  SUBJECTIVE:  Sharon Atkins is a 50 y.o. female who complains of urinary frequency, urgency and dysuria for the past 2 d. Without associated flank pain, fever, chills, vaginal discharge or bleeding. Gross hematuria: not present. No specific aggravating or alleviating factors reported. No LE edema. Normal PO intake without n/v/d. Without specific abdominal pain. Ambulatory without difficulty. OTC treatment: AZO without relief. LMP: No LMP recorded. Patient has had a hysterectomy.   OBJECTIVE:  Vitals:   11/10/20 1524  BP: 124/82  Pulse: 73  Resp: 16  Temp: 98.7 F (37.1 C)  TempSrc: Oral  SpO2: 93%   General appearance: alert; no distress Extremities: no edema; symmetrical with no gross deformities Skin: warm and dry Neurologic: normal gait Psychological: alert and cooperative; normal mood and affect  Labs Reviewed  POCT URINALYSIS DIP (MANUAL ENTRY) - Abnormal; Notable for the following components:      Result Value   Clarity, UA cloudy (*)    Blood, UA small (*)    Nitrite, UA Positive (*)    Leukocytes, UA Large (3+) (*)    All other components within normal limits  URINE CULTURE    Allergies  Allergen Reactions   Hct  [Hydrochlorothiazide] Other (See Comments)    "affects potassium more than normal"   Lyrica [Pregabalin] Other (See Comments)    "makes her feel stupid"    Past Medical History:  Diagnosis Date   ADD (attention deficit disorder)    Anxiety    Arthritis    Chronic pain    Depression    Essential hypertension    Fibromyalgia    Goiter    Hypothyroidism 06/17/2015   Memory loss Since 03/2012   Social History   Socioeconomic History   Marital status: Divorced    Spouse name: Not on file   Number of children: 2   Years of education: 16   Highest education level: Not on file  Occupational History   Occupation: Unemployed  Tobacco Use   Smoking status: Some Days    Packs/day: 0.50    Years: 10.00    Pack years: 5.00    Types: Cigarettes   Smokeless tobacco: Former    Quit date: 09/26/2015  Substance and Sexual Activity   Alcohol use: No    Alcohol/week: 0.0 standard drinks   Drug use: No   Sexual activity: Yes  Other Topics Concern   Not on file  Social History Narrative   Regular exercise-yes   Caffeine Use-yes, 3-16oz daily   Patient lives at home with son.      Social Determinants of Health   Financial Resource Strain: Not on  file  Food Insecurity: Not on file  Transportation Needs: Not on file  Physical Activity: Not on file  Stress: Not on file  Social Connections: Not on file  Intimate Partner Violence: Not on file   Family History  Problem Relation Age of Onset   Hypertension Other        Parent        Vanessa Kick, MD 11/10/20 [phone]

## 2020-11-12 LAB — URINE CULTURE: Culture: NO GROWTH

## 2021-07-14 ENCOUNTER — Ambulatory Visit
Admission: EM | Admit: 2021-07-14 | Discharge: 2021-07-14 | Disposition: A | Discharge status: Home / Self Care | Payer: Self-pay | Attending: Urgent Care | Admitting: Urgent Care

## 2021-07-14 DIAGNOSIS — R350 Frequency of micturition: Secondary | ICD-10-CM | POA: Insufficient documentation

## 2021-07-14 DIAGNOSIS — R591 Generalized enlarged lymph nodes: Secondary | ICD-10-CM | POA: Insufficient documentation

## 2021-07-14 DIAGNOSIS — F17200 Nicotine dependence, unspecified, uncomplicated: Secondary | ICD-10-CM | POA: Insufficient documentation

## 2021-07-14 LAB — POCT URINALYSIS DIP (MANUAL ENTRY)
Bilirubin, UA: NEGATIVE
Glucose, UA: NEGATIVE mg/dL
Ketones, POC UA: NEGATIVE mg/dL
Nitrite, UA: NEGATIVE
Protein Ur, POC: NEGATIVE mg/dL
Spec Grav, UA: 1.02 (ref 1.010–1.025)
Urobilinogen, UA: 0.2 E.U./dL
pH, UA: 6.5 (ref 5.0–8.0)

## 2021-07-14 NOTE — ED Triage Notes (Signed)
Pt reports swelling lymph nodes around neck x 2 days. Reports she did a rapid UTI test 1 day ago and was positive.  ?

## 2021-07-14 NOTE — ED Provider Notes (Signed)
?[facility] ? ? ?MRN: [id number] DOB: Sep 30, 1970 ? ?Subjective:  ? ?Sharon Atkins is a 51 y.o. female presenting for 2-day history of acute onset lymph node swelling around her neck.  States that the area is tender.  She decided to go ahead and check for an urinary tract infection with an over-the-counter device and was faintly positive.  She will need to be checked for this.  Denies fever, throat pain, cough, chest pain, nausea, vomiting, abdominal pain, dysuria, urinary frequency, hematuria.  Patient is a smoker, does 3/4-1ppd.  ? ?No current facility-administered medications for this encounter. ? ?Current Outpatient Medications:  ?  cephALEXin (KEFLEX) 500 MG capsule, Take 1 capsule (500 mg total) by mouth 2 (two) times daily., Disp: 10 capsule, Rfl: 0 ?  fluconazole (DIFLUCAN) 150 MG tablet, Take one tablet by mouth as a single dose. May repeat in 3 days if symptoms persist., Disp: 2 tablet, Rfl: 0 ?  ibuprofen (ADVIL,MOTRIN) 200 MG tablet, Take 200 mg by mouth every 6 (six) hours as needed for moderate pain., Disp: , Rfl:   ? ?Allergies  ?Allergen Reactions  ? Hct [Hydrochlorothiazide] Other (See Comments)  ?  "affects potassium more than normal"  ? Lyrica [Pregabalin] Other (See Comments)  ?  "makes her feel stupid"  ? ? ?Past Medical History:  ?Diagnosis Date  ? ADD (attention deficit disorder)   ? Anxiety   ? Arthritis   ? Chronic pain   ? Depression   ? Essential hypertension   ? Fibromyalgia   ? Goiter   ? Hypothyroidism 06/17/2015  ? Memory loss Since 03/2012  ?  ? ?Past Surgical History:  ?Procedure Laterality Date  ? ABDOMINAL HYSTERECTOMY    ? ABDOMINAL SURGERY    ? APPENDECTOMY    ? [place], 2001  ? PARTIAL HYSTERECTOMY  2009  ? TONSILLECTOMY  1998  ? ? ?Family History  ?Problem Relation Age of Onset  ? Hypertension Other   ?     Parent  ? ? ?Social History  ? ?Tobacco Use  ? Smoking status: Some Days  ?  Packs/day: 0.50  ?  Years: 10.00  ?  Pack years: 5.00  ?   Types: Cigarettes  ? Smokeless tobacco: Former  ?  Quit date: 09/26/2015  ?Substance Use Topics  ? Alcohol use: No  ?  Alcohol/week: 0.0 standard drinks  ? Drug use: No  ? ? ?ROS ? ? ?Objective:  ? ?Vitals: ?BP 114/76 (BP Location: Right Arm)   Pulse 68   Temp 98.1 ?F (36.7 ?C) (Oral)   Resp 18   SpO2 95%  ? ?Physical Exam ?Constitutional:   ?   General: She is not in acute distress. ?   Appearance: Normal appearance. She is well-developed. She is not ill-appearing, toxic-appearing or diaphoretic.  ?HENT:  ?   Head: Normocephalic and atraumatic.  ?   Nose: Nose normal.  ?   Mouth/Throat:  ?   Mouth: Mucous membranes are moist.  ?   Pharynx: No oropharyngeal exudate or posterior oropharyngeal erythema.  ?Eyes:  ?   General: No scleral icterus.    ?   Right eye: No discharge.     ?   Left eye: No discharge.  ?   Extraocular Movements: Extraocular movements intact.  ?   Conjunctiva/sclera: Conjunctivae normal.  ?Cardiovascular:  ?   Rate and Rhythm: Normal rate.  ?   Heart sounds: No murmur heard. ?  No friction rub. No  gallop.  ?Pulmonary:  ?   Effort: Pulmonary effort is normal. No respiratory distress.  ?   Breath sounds: No stridor. No wheezing, rhonchi or rales.  ?Chest:  ?   Chest wall: No tenderness.  ?Abdominal:  ?   General: Bowel sounds are normal. There is no distension.  ?   Palpations: Abdomen is soft. There is no mass.  ?   Tenderness: There is no abdominal tenderness. There is no right CVA tenderness, left CVA tenderness, guarding or rebound.  ?Lymphadenopathy:  ?   Head:  ?   Right side of head: No submental, submandibular, tonsillar, preauricular, posterior auricular or occipital adenopathy.  ?   Left side of head: No submental, submandibular, tonsillar, preauricular, posterior auricular or occipital adenopathy.  ?   Cervical: Cervical adenopathy present.  ?   Right cervical: Superficial cervical adenopathy present. No deep or posterior cervical adenopathy. ?   Left cervical: Superficial cervical  adenopathy present. No deep or posterior cervical adenopathy.  ?   Upper Body:  ?   Right upper body: Supraclavicular adenopathy present.  ?   Left upper body: Supraclavicular adenopathy present.  ?Skin: ?   General: Skin is warm and dry.  ?Neurological:  ?   General: No focal deficit present.  ?   Mental Status: She is alert and oriented to person, place, and time.  ?Psychiatric:     ?   Mood and Affect: Mood normal.     ?   Behavior: Behavior normal.     ?   Thought Content: Thought content normal.     ?   Judgment: Judgment normal.  ? ? ?Results for orders placed or performed during the hospital encounter of 07/14/21 (from the past 24 hour(s))  ?POCT urinalysis dipstick     Status: Abnormal  ? Collection Time: 07/14/21  2:10 PM  ?Result Value Ref Range  ? Color, UA yellow yellow  ? Clarity, UA hazy (A) clear  ? Glucose, UA negative negative mg/dL  ? Bilirubin, UA negative negative  ? Ketones, POC UA negative negative mg/dL  ? Spec Grav, UA 1.020 1.010 - 1.025  ? Blood, UA trace-intact (A) negative  ? pH, UA 6.5 5.0 - 8.0  ? Protein Ur, POC negative negative mg/dL  ? Urobilinogen, UA 0.2 0.2 or 1.0 E.U./dL  ? Nitrite, UA Negative Negative  ? Leukocytes, UA Small (1+) (A) Negative  ? ? ?Assessment and Plan :  ? ?PDMP not reviewed this encounter. ? ?1. Urinary frequency   ?2. Lymphadenopathy   ?3. Smoking   ? ?Urine culture pending.  I offered her naproxen but patient is already taking ibuprofen and prefers to stick with this.  Counseled against using an antibiotic as patient does not hydrate very well and drinks a lot of tea.  Recommended cutting back on this and avoiding urinary irritants.  I did encourage patient to quit smoking. At discharge, patient asked to be evaluated for shortness of breath, chronic knee pain.  I did auscultate heart and lung sounds and she had a clear cardiopulmonary exam.  Recommended that she continue with follow-up with her PCP for general health screening.  I will obtain a basic  metabolic panel and CBC especially in the context of her having lymphadenopathy.  Otherwise, recommend supportive care. Counseled patient on potential for adverse effects with medications prescribed/recommended today, ER and return-to-clinic precautions discussed, patient verbalized understanding. ? ?  ?Jaynee Eagles, PA-C ?07/14/21 1450 ? ?

## 2021-07-15 LAB — BASIC METABOLIC PANEL
BUN/Creatinine Ratio: 11 (ref 9–23)
BUN: 15 mg/dL (ref 6–24)
CO2: 22 mmol/L (ref 20–29)
Calcium: 9.4 mg/dL (ref 8.7–10.2)
Chloride: 102 mmol/L (ref 96–106)
Creatinine, Ser: 1.35 mg/dL — ABNORMAL HIGH (ref 0.57–1.00)
Glucose: 81 mg/dL (ref 70–99)
Potassium: 4.4 mmol/L (ref 3.5–5.2)
Sodium: 140 mmol/L (ref 134–144)
eGFR: 48 mL/min/{1.73_m2} — ABNORMAL LOW (ref >59)

## 2021-07-15 LAB — CBC WITH DIFFERENTIAL/PLATELET
Basophils Absolute: 0.1 10*3/uL (ref 0.0–0.2)
Basos: 1 %
EOS (ABSOLUTE): 0.3 10*3/uL (ref 0.0–0.4)
Eos: 5 %
Hematocrit: 38.1 % (ref 34.0–46.6)
Hemoglobin: 13.1 g/dL (ref 11.1–15.9)
Immature Grans (Abs): 0 10*3/uL (ref 0.0–0.1)
Immature Granulocytes: 0 %
Lymphocytes Absolute: 2.6 10*3/uL (ref 0.7–3.1)
Lymphs: 39 %
MCH: 34.2 pg — ABNORMAL HIGH (ref 26.6–33.0)
MCHC: 34.4 g/dL (ref 31.5–35.7)
MCV: 100 fL — ABNORMAL HIGH (ref 79–97)
Monocytes Absolute: 0.4 10*3/uL (ref 0.1–0.9)
Monocytes: 6 %
Neutrophils Absolute: 3.3 10*3/uL (ref 1.4–7.0)
Neutrophils: 49 %
Platelets: 204 10*3/uL (ref 150–450)
RBC: 3.83 x10E6/uL (ref 3.77–5.28)
RDW: 13.8 % (ref 11.7–15.4)
WBC: 6.7 10*3/uL (ref 3.4–10.8)

## 2021-07-16 LAB — URINE CULTURE

## 2023-02-26 ENCOUNTER — Ambulatory Visit
Admission: EM | Admit: 2023-02-26 | Discharge: 2023-02-26 | Disposition: A | Discharge status: Home / Self Care | Payer: Self-pay | Attending: Nurse Practitioner | Admitting: Nurse Practitioner

## 2023-02-26 DIAGNOSIS — M79671 Pain in right foot: Secondary | ICD-10-CM

## 2023-02-26 NOTE — ED Triage Notes (Signed)
Pt reports right foot pain x 4 days, pt states she was at and all of a sudden a sharp pain started in the side of her foot.  States she can hardly walk on the ankle. Denies any injury to the foot.

## 2023-02-26 NOTE — ED Provider Notes (Signed)
[facility]    CSN: [id number] Arrival date & time: 02/26/23  1539      History   Chief Complaint No chief complaint on file.   HPI Sharon Atkins is a 52 y.o. female.   Patient presents today with 4-day history of pain to the right foot.  She denies recent fall, trauma, or injury to the foot.  Reports pain is worse at work when she is walking around for extended periods of time.  She denies bruising, swelling, or redness to the foot.  Reports it hurts to bear weight on the foot.  No numbness or tingling in the toes, no history of surgeries to the right foot.  Has been taking ibuprofen for pain without improvement.    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/28/2015   Urinary retention 09/25/2015   Acute hypokalemia 09/23/2015   Weakness generalized 09/23/2015   Elevated troponin I level 09/23/2015   Elevated CK 09/23/2015   Lower urinary tract infectious disease 06/17/2015   Hypothyroidism 06/17/2015   MDD (major depressive disorder) 02/25/2015   ADD (attention deficit disorder) 05/17/2013   Transaminitis 05/17/2013   Tobacco abuse 05/17/2013   Muscle jerks during sleep 05/17/2013   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       Home Medications    Prior to Admission medications   Medication Sig Start Date End Date Taking? Authorizing Provider  ibuprofen (ADVIL,MOTRIN) 200 MG tablet Take 200 mg by mouth every 6 (six) hours as needed for moderate pain.    [provider]  atenolol (TENORMIN) 50 MG tablet Take 50 mg by mouth daily.  10/28/19  [provider]  DULoxetine (CYMBALTA) 60 MG capsule Take 1 capsule (60 mg total) by mouth daily. 06/22/16 10/28/19  Campbell Riches, NP  levothyroxine (SYNTHROID, LEVOTHROID) 137 MCG tablet Take 1 tablet (137 mcg total) by mouth daily. 10/24/16 10/28/19  Babs Sciara, MD  metFORMIN (GLUCOPHAGE) 500 MG tablet Take 1 tablet (500 mg total) by mouth 2 (two) times daily with a meal. Patient not taking: Reported on 10/24/2016 11/09/15 10/28/19  Campbell Riches, NP  pantoprazole (PROTONIX) 20 MG tablet Take 1 tablet (20 mg total) by mouth daily. 10/24/16 10/28/19  Burgess Amor, PA-C  potassium chloride SA (K-DUR,KLOR-CON) 20 MEQ tablet Take 2 tablets (40 mEq total) by mouth daily. Patient not taking: Reported on 10/24/2016 10/04/15 10/28/19  Campbell Riches, NP    Family History Family History  Problem Relation Age of Onset   Hypertension Other        Parent    Social History Social  History   Tobacco Use   Smoking status: Some Days    Current packs/day: 0.50    Average packs/day: 0.5 packs/day for 10.0 years (5.0 ttl pk-yrs)    Types: Cigarettes   Smokeless tobacco: Former    Quit date: 09/26/2015  Substance Use Topics   Alcohol use: No    Alcohol/week: 0.0 standard drinks of alcohol   Drug use: No     Allergies   Hct [hydrochlorothiazide] and Lyrica [pregabalin]   Review of Systems Review of Systems Per HPI  Physical Exam Triage Vital Signs ED Triage Vitals  Encounter Vitals Group     BP 02/26/23 1608 (!) 145/86     Systolic BP Percentile --      Diastolic BP Percentile --      Pulse Rate 02/26/23 1608 83     Resp 02/26/23 1608 17      Temp 02/26/23 1608 98.8 F (37.1 C)     Temp Source 02/26/23 1608 Oral     SpO2 02/26/23 1608 99 %     Weight --      Height --      Head Circumference --      Peak Flow --      Pain Score 02/26/23 1611 7     Pain Loc --      Pain Education --      Exclude from Growth Chart --    No data found.  Updated Vital Signs BP (!) 145/86 (BP Location: Right Arm)   Pulse 83   Temp 98.8 F (37.1 C) (Oral)   Resp 17   SpO2 99%   Visual Acuity Right Eye Distance:   Left Eye Distance:   Bilateral Distance:    Right Eye Near:   Left Eye Near:    Bilateral Near:     Physical Exam Vitals and nursing note reviewed.  Constitutional:      General: She is not in acute distress.    Appearance: Normal appearance. She is not toxic-appearing.  HENT:     Mouth/Throat:     Mouth: Mucous membranes are moist.     Pharynx: Oropharynx is clear.  Pulmonary:     Effort: Pulmonary effort is normal. No respiratory distress.  Musculoskeletal:       Feet:  Feet:     Comments: Inspection: no swelling, bruising, obvious deformity or redness to right foot Palpation: tender to palpation in approximately areas marked; no obvious deformities palpated ROM: Full ROM to right foot and ankle Strength: 5/5 right lower extremity Neurovascular: neurovascularly intact in right lower extremity  Skin:    General: Skin is warm and dry.     Capillary Refill: Capillary refill takes less than 2 seconds.     Coloration: Skin is not jaundiced or pale.     Findings: No erythema.  Neurological:     Mental Status: She is alert and oriented to person, place, and time.  Psychiatric:        Behavior: Behavior is cooperative.      UC Treatments / Results  Labs (all labs ordered are listed, but only abnormal results are displayed) Labs Reviewed - No data to display  EKG   Radiology No results found.  Procedures Procedures (including critical care time)  Medications Ordered in UC Medications - No data  to display  Initial Impression / Assessment and Plan / UC Course  I have reviewed the triage vital signs and the nursing notes.  Pertinent labs & imaging results  that were available during my care of the patient were reviewed by me and considered in my medical decision making (see chart for details).   Patient is well-appearing, normotensive, afebrile, not tachycardic, not tachypneic, oxygenating well on room air.    1. Right foot pain Unclear etiology, suspect possible tendinitis versus plantar fasciitis Treat with alternating NSAID and Tylenol, recommended stretching and ice exercises Ace wrap applied today Recommended follow-up with podiatry if symptoms do not improve with treatment and contact information provided Work excuse provided  The patient was given the opportunity to ask questions.  All questions answered to their satisfaction.  The patient is in agreement to this plan.    Final Clinical Impressions(s) / UC Diagnoses   Final diagnoses:  Right foot pain     Discharge Instructions      I am not sure what is causing your foot pain.  As we discussed, it could be plantar fascitis or a sprain along one of the tendons of your foot.  Wear the ace wrap with walking around.  Recommend exercises rolling your foot over a water bottle 10 times a couple times per day to help stretch out the deep tissue in your foot.  Alternate Tylenol and ibuprofen as needed for pain.  Follow-up with podiatry symptoms do not improve with treatment.  Contact information has been provided.    ED Prescriptions   None    PDMP not reviewed this encounter.   Valentino Nose, NP 02/26/23 [phone]

## 2023-02-26 NOTE — Discharge Instructions (Signed)
I am not sure what is causing your foot pain.  As we discussed, it could be plantar fascitis or a sprain along one of the tendons of your foot.  Wear the ace wrap with walking around.  Recommend exercises rolling your foot over a water bottle 10 times a couple times per day to help stretch out the deep tissue in your foot.  Alternate Tylenol and ibuprofen as needed for pain.  Follow-up with podiatry symptoms do not improve with treatment.  Contact information has been provided.

## 2023-07-05 ENCOUNTER — Encounter: Payer: Self-pay | Admitting: Podiatry

## 2023-07-05 ENCOUNTER — Other Ambulatory Visit: Payer: Self-pay | Admitting: Podiatry

## 2023-07-05 ENCOUNTER — Ambulatory Visit (HOSPITAL_COMMUNITY)
Admission: RE | Admit: 2023-07-05 | Discharge: 2023-07-05 | Disposition: A | Discharge status: Home / Self Care | Payer: Self-pay | Source: Ambulatory Visit | Attending: Cardiology | Admitting: Cardiology

## 2023-07-05 ENCOUNTER — Ambulatory Visit (INDEPENDENT_AMBULATORY_CARE_PROVIDER_SITE_OTHER): Payer: Self-pay

## 2023-07-05 ENCOUNTER — Ambulatory Visit (INDEPENDENT_AMBULATORY_CARE_PROVIDER_SITE_OTHER): Payer: Self-pay | Admitting: Podiatry

## 2023-07-05 DIAGNOSIS — M7989 Other specified soft tissue disorders: Secondary | ICD-10-CM

## 2023-07-05 DIAGNOSIS — M722 Plantar fascial fibromatosis: Secondary | ICD-10-CM

## 2023-07-05 DIAGNOSIS — D72829 Elevated white blood cell count, unspecified: Secondary | ICD-10-CM | POA: Insufficient documentation

## 2023-07-05 DIAGNOSIS — I824Y1 Acute embolism and thrombosis of unspecified deep veins of right proximal lower extremity: Secondary | ICD-10-CM

## 2023-07-05 DIAGNOSIS — M778 Other enthesopathies, not elsewhere classified: Secondary | ICD-10-CM

## 2023-07-05 DIAGNOSIS — F419 Anxiety disorder, unspecified: Secondary | ICD-10-CM | POA: Insufficient documentation

## 2023-07-05 MED ORDER — METHYLPREDNISOLONE 4 MG PO TBPK: ORAL_TABLET | ORAL | 0 refills | Status: AC

## 2023-07-05 NOTE — Progress Notes (Signed)
 Subjective:  Patient ID: Sharon Atkins, female    DOB: November 30, 1970,  MRN: [id number] HPI Chief Complaint  Patient presents with   Foot Pain    Plantar heel/dorsal forefoot right - severe ache since November, AM pain and now constant, swelling, no injury, throbs throughout the day, went to Urgent Care - said had plantar fasciitis, put her in an acewrap, tried Ibuprofen, exercises, ice, massage, insoles, new shoes-not improved, worsened   New Patient (Initial Visit)    53 y.o. female presents with the above complaint.   ROS: Denies fever chills nausea vomit muscle aches pains calf pain back pain chest pain shortness of breath.  Past Medical History:  Diagnosis Date   ADD (attention deficit disorder)    Anxiety    Arthritis    Chronic pain    Depression    Essential hypertension    Fibromyalgia    Goiter    Hypothyroidism 06/17/2015   Memory loss Since 03/2012   Past Surgical History:  Procedure Laterality Date   ABDOMINAL HYSTERECTOMY     ABDOMINAL SURGERY     APPENDECTOMY     CESAREAN SECTION  1995, 2001   PARTIAL HYSTERECTOMY  2009   TONSILLECTOMY  1998    Current Outpatient Medications:    ATORVASTATIN CALCIUM PO, Take by mouth., Disp: , Rfl:    levothyroxine (SYNTHROID) 100 MCG tablet, Take 100 mcg by mouth daily before breakfast., Disp: , Rfl:    methylPREDNISolone (MEDROL DOSEPAK) 4 MG TBPK tablet, 6 day dose pack - take as directed, Disp: 21 tablet, Rfl: 0   Vitamin D, Ergocalciferol, (DRISDOL) 1.25 MG (50000 UNIT) CAPS capsule, Take 50,000 Units by mouth every 7 (seven) days., Disp: , Rfl:    ibuprofen (ADVIL,MOTRIN) 200 MG tablet, Take 200 mg by mouth every 6 (six) hours as needed for moderate pain., Disp: , Rfl:   Allergies  Allergen Reactions   Hct [Hydrochlorothiazide] Other (See Comments)    "affects potassium more than normal"   Lyrica [Pregabalin] Other (See Comments)    "makes her feel stupid"   Review of Systems Objective:  There were no vitals  filed for this visit.  General: Well developed, nourished, in no acute distress, alert and oriented x3   Dermatological: Skin is warm, dry and supple bilateral. Nails x 10 are well maintained; remaining integument appears unremarkable at this time. There are no open sores, no preulcerative lesions, no rash or signs of infection present.  Vascular: Dorsalis Pedis artery and Posterior Tibial artery pedal pulses are 2/4 bilateral with immedate capillary fill time. Pedal hair growth present. No varicosities and no lower extremity edema present bilateral.   Neruologic: Grossly intact via light touch bilateral. Vibratory intact via tuning fork bilateral. Protective threshold with Semmes Wienstein monofilament intact to all pedal sites bilateral. Patellar and Achilles deep tendon reflexes 2+ bilateral. No Babinski or clonus noted bilateral.   Musculoskeletal: No gross boney pedal deformities bilateral. No pain, crepitus, or limitation noted with foot and ankle range of motion bilateral. Muscular strength 5/5 in all groups tested bilateral.  Warm swollen calf with swelling to the right foot.  She has pain on palpation medial lateral compression of the calf.  She has pain on palpation of the forefoot.  Gait: Unassisted, Nonantalgic.    Radiographs:  Radiographs taken today do not demonstrate any type of osseous abnormality soft tissue swelling is confluent around the forefoot and into the leg.  Assessment & Plan:   Assessment: Cannot rule out a blood  clot/DVT right calf.  Plan: Sending her for a D-dimer and a stat venous ultrasound for blood clot right calf.  If this is negative I will follow-up with her in a couple weeks.  I did write a prescription for methylprednisolone.  I also recommended warm moist compresses to the tenderness in the calf and an adult aspirin daily.     Annastasia Haskins T. [place], [place]

## 2023-11-15 ENCOUNTER — Emergency Department (HOSPITAL_COMMUNITY): Payer: Self-pay

## 2023-11-15 ENCOUNTER — Other Ambulatory Visit: Payer: Self-pay

## 2023-11-15 ENCOUNTER — Emergency Department (HOSPITAL_COMMUNITY)
Admission: EM | Admit: 2023-11-15 | Discharge: 2023-11-15 | Disposition: A | Discharge status: Home / Self Care | Payer: Self-pay | Attending: Emergency Medicine | Admitting: Emergency Medicine

## 2023-11-15 ENCOUNTER — Encounter (HOSPITAL_COMMUNITY): Payer: Self-pay

## 2023-11-15 DIAGNOSIS — E876 Hypokalemia: Secondary | ICD-10-CM | POA: Insufficient documentation

## 2023-11-15 DIAGNOSIS — R400 Somnolence: Secondary | ICD-10-CM | POA: Insufficient documentation

## 2023-11-15 DIAGNOSIS — E039 Hypothyroidism, unspecified: Secondary | ICD-10-CM | POA: Insufficient documentation

## 2023-11-15 LAB — RAPID URINE DRUG SCREEN, HOSP PERFORMED
Amphetamines: NOT DETECTED
Barbiturates: NOT DETECTED
Benzodiazepines: POSITIVE — AB
Cocaine: NOT DETECTED
Opiates: NOT DETECTED
Tetrahydrocannabinol: NOT DETECTED

## 2023-11-15 LAB — URINALYSIS, ROUTINE W REFLEX MICROSCOPIC
Bilirubin Urine: NEGATIVE
Glucose, UA: NEGATIVE mg/dL
Hgb urine dipstick: NEGATIVE
Ketones, ur: NEGATIVE mg/dL
Leukocytes,Ua: NEGATIVE
Nitrite: NEGATIVE
Protein, ur: NEGATIVE mg/dL
Specific Gravity, Urine: 1.002 — ABNORMAL LOW (ref 1.005–1.030)
pH: 6 (ref 5.0–8.0)

## 2023-11-15 LAB — TSH: TSH: 38.801 u[IU]/mL — ABNORMAL HIGH (ref 0.350–4.500)

## 2023-11-15 LAB — CBC WITH DIFFERENTIAL/PLATELET
Abs Immature Granulocytes: 0.09 K/uL — ABNORMAL HIGH (ref 0.00–0.07)
Basophils Absolute: 0 K/uL (ref 0.0–0.1)
Basophils Relative: 0 %
Eosinophils Absolute: 0.4 K/uL (ref 0.0–0.5)
Eosinophils Relative: 3 %
HCT: 37.4 % (ref 36.0–46.0)
Hemoglobin: 12.6 g/dL (ref 12.0–15.0)
Immature Granulocytes: 1 %
Lymphocytes Relative: 7 %
Lymphs Abs: 0.9 K/uL (ref 0.7–4.0)
MCH: 35.1 pg — ABNORMAL HIGH (ref 26.0–34.0)
MCHC: 33.7 g/dL (ref 30.0–36.0)
MCV: 104.2 fL — ABNORMAL HIGH (ref 80.0–100.0)
Monocytes Absolute: 0.6 K/uL (ref 0.1–1.0)
Monocytes Relative: 5 %
Neutro Abs: 11.7 K/uL — ABNORMAL HIGH (ref 1.7–7.7)
Neutrophils Relative %: 84 %
Platelets: 290 K/uL (ref 150–400)
RBC: 3.59 MIL/uL — ABNORMAL LOW (ref 3.87–5.11)
RDW: 14.4 % (ref 11.5–15.5)
WBC: 13.8 K/uL — ABNORMAL HIGH (ref 4.0–10.5)
nRBC: 0 % (ref 0.0–0.2)

## 2023-11-15 LAB — COMPREHENSIVE METABOLIC PANEL WITH GFR
ALT: 21 U/L (ref 0–44)
AST: 22 U/L (ref 15–41)
Albumin: 3.7 g/dL (ref 3.5–5.0)
Alkaline Phosphatase: 82 U/L (ref 38–126)
Anion gap: 12 (ref 5–15)
BUN: 9 mg/dL (ref 6–20)
CO2: 21 mmol/L — ABNORMAL LOW (ref 22–32)
Calcium: 8.8 mg/dL — ABNORMAL LOW (ref 8.9–10.3)
Chloride: 103 mmol/L (ref 98–111)
Creatinine, Ser: 1.05 mg/dL — ABNORMAL HIGH (ref 0.44–1.00)
GFR, Estimated: >60 mL/min (ref >60)
Glucose, Bld: 194 mg/dL — ABNORMAL HIGH (ref 70–99)
Potassium: 3.3 mmol/L — ABNORMAL LOW (ref 3.5–5.1)
Sodium: 136 mmol/L (ref 135–145)
Total Bilirubin: 0.6 mg/dL (ref 0.0–1.2)
Total Protein: 7 g/dL (ref 6.5–8.1)

## 2023-11-15 LAB — MAGNESIUM: Magnesium: 1.7 mg/dL (ref 1.7–2.4)

## 2023-11-15 MED ORDER — SODIUM CHLORIDE 0.9 % IV BOLUS
1000.0000 mL | Freq: Once | INTRAVENOUS | Status: AC
  Administered 2023-11-15: 1000 mL via INTRAVENOUS

## 2023-11-15 MED ORDER — POTASSIUM CHLORIDE ER 10 MEQ PO TBCR: 10.0000 meq | EXTENDED_RELEASE_TABLET | Freq: Every day | ORAL | 0 refills | Status: AC

## 2023-11-15 MED ORDER — POTASSIUM CHLORIDE CRYS ER 20 MEQ PO TBCR
40.0000 meq | EXTENDED_RELEASE_TABLET | Freq: Once | ORAL | Status: AC
  Administered 2023-11-15: 40 meq via ORAL
  Filled 2023-11-15: qty 2

## 2023-11-15 MED ORDER — NALOXONE HCL 0.4 MG/ML IJ SOLN
0.4000 mg | Freq: Once | INTRAMUSCULAR | Status: AC
  Administered 2023-11-15: 0.4 mg via INTRAVENOUS
  Filled 2023-11-15: qty 1

## 2023-11-15 NOTE — ED Provider Notes (Signed)
 [place] EMERGENCY DEPARTMENT AT [facility] Provider Note   CSN: [id number] Arrival date & time: 11/15/23  1551     Patient presents with: Dizziness   Sharon Atkins is a 53 y.o. female.  {Add pertinent medical, surgical, social history, OB history to HPI:32947}  Dizziness  This patient is a 53 year old female, history of high cholesterol hypothyroidism and vitamin D  deficiency.  She does not know the names of her medications except she states she takes her thyroid  medicine.  She presents in the care of her mother who accompanies her to the hospital today as she has acute altered mental status.  Evidently the patient has been feeling off for the last couple of weeks feeling generally weak, it has been progressive but she has been functional, last night her mother saw her when she went over to the mother's house and she seemed to be okay just generally tired looking.  Today the grandmother received a call from the patient's son stating that he thought she might be having a stroke because she was not herself on the phone, she is extremely weak, has had a fall at the house because she was lightheaded, fell to the ground, is slurring her words and has poor memory of events over the last couple of days.  The mother reports that there was a history of an admission to the hospital dating back to 2017, as I review those records it appears that she was admitted for hypokalemic periodic paralysis.  After that the patient followed up in the outpatient setting because of a positive ANA, a diagnosis of fibromyalgia, a diagnosis of polycythemia vera.  The patient denies drug abuse alcohol abuse though she does smoke cigarettes, denies dysuria diarrhea or swelling.  She does have a headache in the left frontal area    Prior to Admission medications   Medication Sig Start Date End Date Taking? Authorizing Provider  ATORVASTATIN CALCIUM  PO Take by mouth.    [provider]  ibuprofen   (ADVIL ,MOTRIN ) 200 MG tablet Take 200 mg by mouth every 6 (six) hours as needed for moderate pain.    [provider]  levothyroxine  (SYNTHROID ) 100 MCG tablet Take 100 mcg by mouth daily before breakfast.    [provider]  methylPREDNISolone  (MEDROL  DOSEPAK) 4 MG TBPK tablet 6 day dose pack - take as directed 07/05/23   Hyatt, Max T, DPM  Vitamin D , Ergocalciferol , (DRISDOL ) 1.25 MG (50000 UNIT) CAPS capsule Take 50,000 Units by mouth every 7 (seven) days.    [provider]    Allergies: Hct [hydrochlorothiazide] and Lyrica [pregabalin]    Review of Systems  Neurological:  Positive for dizziness.  All other systems reviewed and are negative.   Updated Vital Signs BP 131/88 (BP Location: Right Arm)   Pulse 100   Temp 98.4 F (36.9 C) (Oral)   Resp 14   Ht 1.524 m (5')   Wt 72.1 kg   SpO2 91%   BMI 31.04 kg/m   Physical Exam Vitals and nursing note reviewed.  Constitutional:      General: She is not in acute distress.    Appearance: She is well-developed.  HENT:     Head: Normocephalic and atraumatic.     Mouth/Throat:     Mouth: Mucous membranes are dry.     Pharynx: No oropharyngeal exudate.  Eyes:     General: No scleral icterus.       Right eye: No discharge.  Left eye: No discharge.     Conjunctiva/sclera: Conjunctivae normal.     Pupils: Pupils are equal, round, and reactive to light.  Neck:     Thyroid : No thyromegaly.     Vascular: No JVD.  Cardiovascular:     Rate and Rhythm: Normal rate and regular rhythm.     Heart sounds: Normal heart sounds. No murmur heard.    No friction rub. No gallop.  Pulmonary:     Effort: Pulmonary effort is normal. No respiratory distress.     Breath sounds: Normal breath sounds. No wheezing or rales.  Abdominal:     General: Bowel sounds are normal. There is no distension.     Palpations: Abdomen is soft. There is no mass.     Tenderness: There is no abdominal tenderness.  Musculoskeletal:         General: No tenderness. Normal range of motion.     Cervical back: Normal range of motion and neck supple.     Right lower leg: No edema.     Left lower leg: No edema.  Lymphadenopathy:     Cervical: No cervical adenopathy.  Skin:    General: Skin is warm and dry.     Findings: No erythema or rash.  Neurological:     General: No focal deficit present.     Mental Status: She is alert.     Coordination: Coordination normal.     Comments: The patient is somnolent but arousable to voice, she has heavy eyelids, dry mucous membranes, is slow to respond but answers questions appropriately although she moves very slowly.  She has a generally weak grip and cannot sit up without assistance from nursing, she is able to slightly lift both legs off the bed albeit symmetrically.  There is no facial droop, her speech is slightly slurred pupils are normal, no facial droop, finger-nose-finger is normal but slow, no pronator drift.  Psychiatric:        Behavior: Behavior normal.     (all labs ordered are listed, but only abnormal results are displayed) Labs Reviewed  CBC WITH DIFFERENTIAL/PLATELET - Abnormal; Notable for the following components:      Result Value   WBC 13.8 (*)    RBC 3.59 (*)    MCV 104.2 (*)    MCH 35.1 (*)    Neutro Abs 11.7 (*)    Abs Immature Granulocytes 0.09 (*)    All other components within normal limits  COMPREHENSIVE METABOLIC PANEL WITH GFR  URINALYSIS, ROUTINE W REFLEX MICROSCOPIC    EKG: EKG Interpretation Date/Time:  Thursday November 15 2023 16:02:38 EDT Ventricular Rate:  98 PR Interval:  160 QRS Duration:  100 QT Interval:  380 QTC Calculation: 485 R Axis:   131  Text Interpretation: Normal sinus rhythm Left posterior fascicular block Cannot rule out Anterior infarct , age undetermined Abnormal ECG When compared with ECG of 23-Sep-2015 16:36, PREVIOUS ECG IS PRESENT Confirmed by Cleotilde Rogue ([id number]) on 11/15/2023 4:09:08 PM  Radiology: No results  found.  {Document cardiac monitor, telemetry assessment procedure when appropriate:32947} Procedures   Medications Ordered in the ED - No data to display    {Click here for ABCD2, HEART and other calculators REFRESH Note before signing:1}                              Medical Decision Making Amount and/or Complexity of Data Reviewed Labs: ordered. Radiology: ordered.  Risk  Prescription drug management.   Ultimately this patient has unremarkable vital signs but appears both dehydrated and severely weak.  This may be recurrent hypokalemia but would also consider consider stroke, intracerebral hemorrhage, seizure, or other electrolyte abnormalities or renal failure.  The patient is agreeable to the workup, she does not have any focality to her exam and is more encephalopathic    Co morbidities / Chronic conditions that complicate the patient evaluation  None    Additional history obtained:  Additional history obtained from EMR External records from outside source obtained and reviewed including family members at the bedside as well as the medical record   Lab Tests:  I Ordered, and personally interpreted labs.  The pertinent results include:  Drug screen positive for benzodiazepines, urinalysis with no signs of infection, blood counts with no anemia but slight leukocytosis, metabolic panel with normal kidney function mild hyperglycemia and mild hypokalemia, potassium replaced, magnesium  normal   Imaging Studies ordered:  I ordered imaging studies including chest x-ray as well as CT scan of the brain I independently visualized and interpreted imaging which showed no acute findings I agree with the radiologist interpretation   Cardiac Monitoring: / EKG:  The patient was maintained on a cardiac monitor.  I personally viewed and interpreted the cardiac monitored which showed an underlying rhythm of: Normal sinus rhythm   Problem List / ED Course / Critical interventions /  Medication management  After getting a single dose of Narcan  the patient was able to wake up and ambulate back and forth to the bathroom stating that she felt better.  Though her drug screen did not come back positive for opiates I suspect that there was some sort of substance related somnolence given his remarkable improvement immediately after the medicine within 30 seconds.  She came back positive for benzodiazepines and now admits to using Xanax at night to help her sleep, this is not prescribed to her as far as I can tell.  And after searching the PDMP database there is no history of any controlled substances being prescribed to this patient suggesting that she is coming across it nefariously I ordered medication including potassium supplementation, Narcan , IV fluids Reevaluation of the patient after these medicines showed that the patient patient has awoken and returned to her baseline I have reviewed the patients home medicines and have made adjustments as needed    Social Determinants of Health:  None   Test / Admission - Considered:  I considered admission but after watching the patient on the monitor for couple of hours and no recurrent somnolence after getting the Narcan  the patient appears now stable for discharge.  I counseled her on the substance use especially given that she has not prescribed his medications. There are family members at the bedside that we will watch and on her over the next couple of days   {Document critical care time when appropriate  Document review of labs and clinical decision tools ie [id number], etc  Document your independent review of radiology images and any outside records  Document your discussion with family members, caretakers and with consultants  Document social determinants of health affecting pt's care  Document your decision making why or why not admission, treatments were needed:32947:::1}   Final diagnoses:  None    ED Discharge  Orders     None

## 2023-11-15 NOTE — ED Notes (Signed)
 Urine sent to lab

## 2023-11-15 NOTE — ED Triage Notes (Signed)
 Pt arrived via POV from home c/o on-going dizziness X 3-4 weeks. Pt endorses increased weakness and fatigue as well.

## 2023-11-15 NOTE — Discharge Instructions (Addendum)
 All of your testing showed that you had a very slight decrease in your potassium and for this I would like you to take 1 pill a day for 7 days and have your potassium level rechecked.  The CT scan of your brain was normal, the drug screen came back positive for benzodiazepines, I would strongly recommend that you do not take these as they are sedatives and can make you feel this way.  After getting IV fluids you appear better you have been able to walk back and forth to the bathroom, I do want you to return to the emergency department if you develop severe worsening symptoms  If you do not have a doctor see the list below  [facility] Primary Care Doctor List    Rollene Pesa, MD. Specialty: [facility] Medicine Contact information: [address]  [place] [address]  [phone]   Glendia Fielding, MD. Specialty: Family Medicine Contact information: [address]  [place] [address]  [phone]   Benita Outhouse, MD Specialty: Internal Medicine Contact information: [address] [place] [address]  [phone]   Darlyn Hurst, MD. Specialty: Internal Medicine Contact information: [address]  [place] [address]  [phone]    [facility] (Dr. Luke) Specialty: Family Medicine Contact information: [address]  [place] [address]  [phone]   Gaither Langton, MD. Specialty: Internal Medicine Contact information: [address]  [address]  [place] [address]  [phone]   [facility]  [address] [place], [place] [address] [phone]  Services [facility] offers a variety of basic health services.  People needing more complex services will be directed to a physician online. Using these virtual visits, doctors can evaluate and prescribe medicine and treatments. There will be no medication on-site, though [facility] will help patients  fill their prescriptions at little to no cost.   [facility] PRIMARY CARE [address] [place]  [address]  [phone]     [facility] at [facility] [address] [place]  [address]  [phone]   For More information please go to: [email or web address]

## 2023-11-19 ENCOUNTER — Telehealth: Payer: Self-pay

## 2023-11-19 NOTE — Telephone Encounter (Signed)
 Attempted hospital follow up to client recently seen in ED at [facility] on 11/15/23 . Client of [facility] and dual enrolled into program from [facility] Department.  No answer today, left VM requesting return call.   Sharon JONELLE Skeen RN [employer]

## 2023-11-21 ENCOUNTER — Ambulatory Visit (HOSPITAL_COMMUNITY): Payer: Self-pay
